# Patient Record
Sex: Male | Born: 1970 | Race: Black or African American | Hispanic: No | Smoking: Never smoker
Health system: Southern US, Community
[De-identification: ages and names within clinical notes are randomized; demographics above are authoritative.]

## PROBLEM LIST (undated history)

## (undated) DIAGNOSIS — I10 Essential (primary) hypertension: Secondary | ICD-10-CM

## (undated) DIAGNOSIS — N183 Chronic kidney disease, stage 3 unspecified: Secondary | ICD-10-CM

## (undated) DIAGNOSIS — I110 Hypertensive heart disease with heart failure: Secondary | ICD-10-CM

## (undated) DIAGNOSIS — I4891 Unspecified atrial fibrillation: Secondary | ICD-10-CM

## (undated) DIAGNOSIS — R0600 Dyspnea, unspecified: Secondary | ICD-10-CM

## (undated) DIAGNOSIS — I82409 Acute embolism and thrombosis of unspecified deep veins of unspecified lower extremity: Secondary | ICD-10-CM

## (undated) HISTORY — PX: NO PAST SURGERIES: SHX2092

---

## 1998-08-04 ENCOUNTER — Emergency Department (HOSPITAL_COMMUNITY): Admission: EM | Admit: 1998-08-04 | Discharge: 1998-08-04 | Payer: Self-pay

## 2002-03-02 ENCOUNTER — Encounter: Payer: Self-pay | Admitting: Emergency Medicine

## 2002-03-02 ENCOUNTER — Emergency Department (HOSPITAL_COMMUNITY): Admission: EM | Admit: 2002-03-02 | Discharge: 2002-03-02 | Payer: Self-pay | Admitting: Emergency Medicine

## 2003-05-25 ENCOUNTER — Emergency Department (HOSPITAL_COMMUNITY): Admission: EM | Admit: 2003-05-25 | Discharge: 2003-05-25 | Payer: Self-pay | Admitting: Emergency Medicine

## 2003-11-02 ENCOUNTER — Emergency Department (HOSPITAL_COMMUNITY): Admission: EM | Admit: 2003-11-02 | Discharge: 2003-11-03 | Payer: Self-pay | Admitting: Emergency Medicine

## 2004-09-14 ENCOUNTER — Emergency Department (HOSPITAL_COMMUNITY): Admission: EM | Admit: 2004-09-14 | Discharge: 2004-09-14 | Payer: Self-pay | Admitting: Emergency Medicine

## 2006-04-27 ENCOUNTER — Emergency Department (HOSPITAL_COMMUNITY): Admission: EM | Admit: 2006-04-27 | Discharge: 2006-04-28 | Payer: Self-pay

## 2006-06-25 ENCOUNTER — Emergency Department (HOSPITAL_COMMUNITY): Admission: EM | Admit: 2006-06-25 | Discharge: 2006-06-25 | Payer: Self-pay | Admitting: Emergency Medicine

## 2008-05-02 ENCOUNTER — Emergency Department (HOSPITAL_COMMUNITY): Admission: EM | Admit: 2008-05-02 | Discharge: 2008-05-02 | Payer: Self-pay | Admitting: Emergency Medicine

## 2008-11-06 ENCOUNTER — Emergency Department (HOSPITAL_COMMUNITY): Admission: EM | Admit: 2008-11-06 | Discharge: 2008-11-07 | Payer: Self-pay | Admitting: Emergency Medicine

## 2009-04-11 ENCOUNTER — Emergency Department (HOSPITAL_COMMUNITY): Admission: EM | Admit: 2009-04-11 | Discharge: 2009-04-11 | Payer: Self-pay | Admitting: Emergency Medicine

## 2009-04-18 ENCOUNTER — Emergency Department (HOSPITAL_COMMUNITY): Admission: EM | Admit: 2009-04-18 | Discharge: 2009-04-18 | Payer: Self-pay | Admitting: Emergency Medicine

## 2009-06-20 ENCOUNTER — Emergency Department (HOSPITAL_COMMUNITY): Admission: EM | Admit: 2009-06-20 | Discharge: 2009-06-20 | Payer: Self-pay | Admitting: Emergency Medicine

## 2011-12-30 ENCOUNTER — Encounter (HOSPITAL_COMMUNITY): Payer: Self-pay | Admitting: Emergency Medicine

## 2011-12-30 ENCOUNTER — Emergency Department (HOSPITAL_COMMUNITY)
Admission: EM | Admit: 2011-12-30 | Discharge: 2011-12-30 | Disposition: A | Discharge status: Home / Self Care | Payer: Self-pay | Attending: Emergency Medicine | Admitting: Emergency Medicine

## 2011-12-30 DIAGNOSIS — I10 Essential (primary) hypertension: Secondary | ICD-10-CM | POA: Insufficient documentation

## 2011-12-30 DIAGNOSIS — Z79899 Other long term (current) drug therapy: Secondary | ICD-10-CM | POA: Insufficient documentation

## 2011-12-30 DIAGNOSIS — R002 Palpitations: Secondary | ICD-10-CM | POA: Insufficient documentation

## 2011-12-30 DIAGNOSIS — R51 Headache: Secondary | ICD-10-CM | POA: Insufficient documentation

## 2011-12-30 LAB — CBC
MCH: 27.3 pg (ref 26.0–34.0)
Platelets: 317 10*3/uL (ref 150–400)
RBC: 5.46 MIL/uL (ref 4.22–5.81)
WBC: 7.9 10*3/uL (ref 4.0–10.5)

## 2011-12-30 LAB — DIFFERENTIAL
Eosinophils Absolute: 0.1 10*3/uL (ref 0.0–0.7)
Lymphs Abs: 2.9 10*3/uL (ref 0.7–4.0)
Neutrophils Relative %: 55 % (ref 43–77)

## 2011-12-30 LAB — BASIC METABOLIC PANEL
GFR calc Af Amer: >90 mL/min (ref >90)
GFR calc non Af Amer: 78 mL/min — ABNORMAL LOW (ref >90)
Glucose, Bld: 90 mg/dL (ref 70–99)
Potassium: 3.5 mEq/L (ref 3.5–5.1)
Sodium: 142 mEq/L (ref 135–145)

## 2011-12-30 MED ORDER — SODIUM CHLORIDE 0.9 % IV BOLUS (SEPSIS): 1000.0000 mL | Freq: Once | INTRAVENOUS | Status: DC

## 2011-12-30 MED ORDER — HYDROCODONE-ACETAMINOPHEN 5-500 MG PO TABS: 1.0000 | ORAL_TABLET | Freq: Four times a day (QID) | ORAL | Status: AC | PRN

## 2011-12-30 MED ORDER — HYDROCHLOROTHIAZIDE 25 MG PO TABS
25.0000 mg | ORAL_TABLET | Freq: Once | ORAL | Status: AC
  Administered 2011-12-30: 25 mg via ORAL
  Filled 2011-12-30: qty 1

## 2011-12-30 MED ORDER — CLONIDINE HCL 0.1 MG PO TABS
0.1000 mg | ORAL_TABLET | Freq: Once | ORAL | Status: AC
  Administered 2011-12-30: 0.1 mg via ORAL
  Filled 2011-12-30: qty 1

## 2011-12-30 MED ORDER — HYDROCHLOROTHIAZIDE 25 MG PO TABS: 25.0000 mg | ORAL_TABLET | Freq: Every day | ORAL | Status: DC

## 2011-12-30 MED ORDER — OXYCODONE-ACETAMINOPHEN 5-325 MG PO TABS
1.0000 | ORAL_TABLET | Freq: Once | ORAL | Status: AC
  Administered 2011-12-30: 1 via ORAL
  Filled 2011-12-30: qty 1

## 2011-12-30 NOTE — ED Notes (Signed)
PT. REPORTS PALPITATIONS , HEADACHE , DIZZINESS , " SEEING DIFFERENT COLORS WHILE DRIVING " TODAY .

## 2011-12-30 NOTE — Discharge Instructions (Signed)
Arterial Hypertension Arterial hypertension (high blood pressure) is a condition of elevated pressure in your blood vessels. Hypertension over a long period of time is a risk factor for strokes, heart attacks, and heart failure. It is also the leading cause of kidney (renal) failure.  CAUSES   In Adults -- Over 90% of all hypertension has no known cause. This is called essential or primary hypertension. In the other 10% of people with hypertension, the increase in blood pressure is caused by another disorder. This is called secondary hypertension. Important causes of secondary hypertension are:   Heavy alcohol use.   Obstructive sleep apnea.   Hyperaldosterosim (Conn's syndrome).   Steroid use.   Chronic kidney failure.   Hyperparathyroidism.   Medications.   Renal artery stenosis.   Pheochromocytoma.   Cushing's disease.   Coarctation of the aorta.   Scleroderma renal crisis.   Licorice (in excessive amounts).   Drugs (cocaine, methamphetamine).  Your caregiver can explain any items above that apply to you.  In Children -- Secondary hypertension is more common and should always be considered.   Pregnancy -- Few women of childbearing age have high blood pressure. However, up to 10% of them develop hypertension of pregnancy. Generally, this will not harm the woman. It may be a sign of 3 complications of pregnancy: preeclampsia, HELLP syndrome, and eclampsia. Follow up and control with medication is necessary.  SYMPTOMS   This condition normally does not produce any noticeable symptoms. It is usually found during a routine exam.   Malignant hypertension is a late problem of high blood pressure. It may have the following symptoms:   Headaches.   Blurred vision.   End-organ damage (this means your kidneys, heart, lungs, and other organs are being damaged).   Stressful situations can increase the blood pressure. If a person with normal blood pressure has their blood  pressure go up while being seen by their caregiver, this is often termed "white coat hypertension." Its importance is not known. It may be related with eventually developing hypertension or complications of hypertension.   Hypertension is often confused with mental tension, stress, and anxiety.  DIAGNOSIS  The diagnosis is made by 3 separate blood pressure measurements. They are taken at least 1 week apart from each other. If there is organ damage from hypertension, the diagnosis may be made without repeat measurements. Hypertension is usually identified by having blood pressure readings:  Above 140/90 mmHg measured in both arms, at 3 separate times, over a couple weeks.   Over 130/80 mmHg should be considered a risk factor and may require treatment in patients with diabetes.  Blood pressure readings over 120/80 mmHg are called "pre-hypertension" even in non-diabetic patients. To get a true blood pressure measurement, use the following guidelines. Be aware of the factors that can alter blood pressure readings.  Take measurements at least 1 hour after caffeine.   Take measurements 30 minutes after smoking and without any stress. This is another reason to quit smoking - it raises your blood pressure.   Use a proper cuff size. Ask your caregiver if you are not sure about your cuff size.   Most home blood pressure cuffs are automatic. They will measure systolic and diastolic pressures. The systolic pressure is the pressure reading at the start of sounds. Diastolic pressure is the pressure at which the sounds disappear. If you are elderly, measure pressures in multiple postures. Try sitting, lying or standing.   Sit at rest for a minimum of   5 minutes before taking measurements.   You should not be on any medications like decongestants. These are found in many cold medications.   Record your blood pressure readings and review them with your caregiver.  If you have hypertension:  Your caregiver  may do tests to be sure you do not have secondary hypertension (see "causes" above).   Your caregiver may also look for signs of metabolic syndrome. This is also called Syndrome X or Insulin Resistance Syndrome. You may have this syndrome if you have type 2 diabetes, abdominal obesity, and abnormal blood lipids in addition to hypertension.   Your caregiver will take your medical and family history and perform a physical exam.   Diagnostic tests may include blood tests (for glucose, cholesterol, potassium, and kidney function), a urinalysis, or an EKG. Other tests may also be necessary depending on your condition.  PREVENTION  There are important lifestyle issues that you can adopt to reduce your chance of developing hypertension:  Maintain a normal weight.   Limit the amount of salt (sodium) in your diet.   Exercise often.   Limit alcohol intake.   Get enough potassium in your diet. Discuss specific advice with your caregiver.   Follow a DASH diet (dietary approaches to stop hypertension). This diet is rich in fruits, vegetables, and low-fat dairy products, and avoids certain fats.  PROGNOSIS  Essential hypertension cannot be cured. Lifestyle changes and medical treatment can lower blood pressure and reduce complications. The prognosis of secondary hypertension depends on the underlying cause. Many people whose hypertension is controlled with medicine or lifestyle changes can live a normal, healthy life.  RISKS AND COMPLICATIONS  While high blood pressure alone is not an illness, it often requires treatment due to its short- and long-term effects on many organs. Hypertension increases your risk for:  CVAs or strokes (cerebrovascular accident).   Heart failure due to chronically high blood pressure (hypertensive cardiomyopathy).   Heart attack (myocardial infarction).   Damage to the retina (hypertensive retinopathy).   Kidney failure (hypertensive nephropathy).  Your caregiver can  explain list items above that apply to you. Treatment of hypertension can significantly reduce the risk of complications. TREATMENT   For overweight patients, weight loss and regular exercise are recommended. Physical fitness lowers blood pressure.   Mild hypertension is usually treated with diet and exercise. A diet rich in fruits and vegetables, fat-free dairy products, and foods low in fat and salt (sodium) can help lower blood pressure. Decreasing salt intake decreases blood pressure in a 1/3 of people.   Stop smoking if you are a smoker.  The steps above are highly effective in reducing blood pressure. While these actions are easy to suggest, they are difficult to achieve. Most patients with moderate or severe hypertension end up requiring medications to bring their blood pressure down to a normal level. There are several classes of medications for treatment. Blood pressure pills (antihypertensives) will lower blood pressure by their different actions. Lowering the blood pressure by 10 mmHg may decrease the risk of complications by as much as 25%. The goal of treatment is effective blood pressure control. This will reduce your risk for complications. Your caregiver will help you determine the best treatment for you according to your lifestyle. What is excellent treatment for one person, may not be for you. HOME CARE INSTRUCTIONS   Do not smoke.   Follow the lifestyle changes outlined in the "Prevention" section.   If you are on medications, follow the directions   carefully. Blood pressure medications must be taken as prescribed. Skipping doses reduces their benefit. It also puts you at risk for problems.   Follow up with your caregiver, as directed.   If you are asked to monitor your blood pressure at home, follow the guidelines in the "Diagnosis" section above.  SEEK MEDICAL CARE IF:   You think you are having medication side effects.   You have recurrent headaches or lightheadedness.     You have swelling in your ankles.   You have trouble with your vision.  SEEK IMMEDIATE MEDICAL CARE IF:   You have sudden onset of chest pain or pressure, difficulty breathing, or other symptoms of a heart attack.   You have a severe headache.   You have symptoms of a stroke (such as sudden weakness, difficulty speaking, difficulty walking).  MAKE SURE YOU:   Understand these instructions.   Will watch your condition.   Will get help right away if you are not doing well or get worse.  Document Released: 08/04/2005 Document Revised: 07/24/2011 Document Reviewed: 03/04/2007 [employer] Patient Information 2012 [employer]. 

## 2011-12-30 NOTE — ED Provider Notes (Signed)
History     CSN: [id number]  Arrival date & time 12/30/11  0020   First MD Initiated Contact with Patient 12/30/11 0145      Chief Complaint  Patient presents with  . Palpitations     The history is provided by the patient.   the patient reports headache and dizziness today.  Does report palpitations it was transient.  He has no chest pain shortness of breath.  He reports his headache has been present for about a week now.  He denies weakness of his upper and lower extremities.  Denies abdominal pain.  He said no nausea vomiting or diarrhea.  He denies blurred vision.  Nursing note reports he stated that he was seeing different colors when driving today.  He has no prior history of headaches or migraine headaches.  His over-the-counter pain medication without improvement.  His pain is mild to moderate at this time.  He has no history of hypertension.  He does not have a primary care physician and does not get regular checkups.  History reviewed. No pertinent past medical history.  History reviewed. No pertinent past surgical history.  No family history on file.  History  Substance Use Topics  . Smoking status: Never Smoker   . Smokeless tobacco: Not on file  . Alcohol Use: No      Review of Systems  Cardiovascular: Positive for palpitations.  All other systems reviewed and are negative.    Allergies  Review of patient's allergies indicates no known allergies.  Home Medications   Current Outpatient Rx  Name Route Sig Dispense Refill  . HYDROCHLOROTHIAZIDE 25 MG PO TABS Oral Take 1 tablet (25 mg total) by mouth daily. 30 tablet 1  . HYDROCODONE-ACETAMINOPHEN 5-500 MG PO TABS Oral Take 1 tablet by mouth every 6 (six) hours as needed for pain. 8 tablet 0    BP 191/111  Pulse 54  Temp(Src) 97.9 F (36.6 C) (Oral)  Resp 14  SpO2 100%  Physical Exam  Nursing note and vitals reviewed. Constitutional: He is oriented to person, place, and time. He appears  well-developed and well-nourished.  HENT:  Head: Normocephalic and atraumatic.  Eyes: EOM are normal. Pupils are equal, round, and reactive to light.  Neck: Normal range of motion.  Cardiovascular: Normal rate, regular rhythm, normal heart sounds and intact distal pulses.   Pulmonary/Chest: Effort normal and breath sounds normal. No respiratory distress.  Abdominal: Soft. He exhibits no distension. There is no tenderness.  Musculoskeletal: Normal range of motion.  Neurological: He is alert and oriented to person, place, and time.       5/5 strength in major muscle groups of  bilateral upper and lower extremities. Speech normal. No facial asymetry.   Skin: Skin is warm and dry.  Psychiatric: He has a normal mood and affect. Judgment normal.    ED Course  Procedures (including critical care time)  Labs Reviewed  BASIC METABOLIC PANEL - Abnormal; Notable for the following:    GFR calc non Af Amer 78 (*)    All other components within normal limits  CBC  DIFFERENTIAL  URINALYSIS, WITH MICROSCOPIC   No results found.   1. Headache   2. Hypertension       MDM  The patient's headache was treated.  His neuro exam is nonfocal.  He does not need a CT scan of his head at this time.  His glucose is normal his renal function is normal.  He does have hypertension.  This was treated in the emergency department he'll be discharged home with a prescription for hydrochlorothiazide.  I've instructed him in followup with her primary care physician for recheck of his blood pressure.  He understands to return the emergency department for new or worsening symptoms        Lyanne Co, MD 12/30/11 0400

## 2014-03-09 ENCOUNTER — Encounter (HOSPITAL_COMMUNITY): Payer: Self-pay | Admitting: Emergency Medicine

## 2014-03-09 ENCOUNTER — Emergency Department (HOSPITAL_COMMUNITY): Payer: Self-pay

## 2014-03-09 ENCOUNTER — Emergency Department (HOSPITAL_COMMUNITY)
Admission: EM | Admit: 2014-03-09 | Discharge: 2014-03-09 | Disposition: A | Discharge status: Home / Self Care | Payer: Self-pay | Attending: Emergency Medicine | Admitting: Emergency Medicine

## 2014-03-09 DIAGNOSIS — I160 Hypertensive urgency: Secondary | ICD-10-CM

## 2014-03-09 DIAGNOSIS — R509 Fever, unspecified: Secondary | ICD-10-CM | POA: Insufficient documentation

## 2014-03-09 DIAGNOSIS — R51 Headache: Secondary | ICD-10-CM | POA: Insufficient documentation

## 2014-03-09 DIAGNOSIS — Z79899 Other long term (current) drug therapy: Secondary | ICD-10-CM | POA: Insufficient documentation

## 2014-03-09 DIAGNOSIS — I10 Essential (primary) hypertension: Secondary | ICD-10-CM | POA: Insufficient documentation

## 2014-03-09 HISTORY — DX: Essential (primary) hypertension: I10

## 2014-03-09 LAB — COMPREHENSIVE METABOLIC PANEL
ALBUMIN: 4.2 g/dL (ref 3.5–5.2)
ALT: 32 U/L (ref 0–53)
AST: 26 U/L (ref 0–37)
Alkaline Phosphatase: 89 U/L (ref 39–117)
Anion gap: 14 (ref 5–15)
BUN: 13 mg/dL (ref 6–23)
CALCIUM: 9.3 mg/dL (ref 8.4–10.5)
CO2: 26 mEq/L (ref 19–32)
CREATININE: 1.37 mg/dL — AB (ref 0.50–1.35)
Chloride: 101 mEq/L (ref 96–112)
GFR calc Af Amer: 72 mL/min — ABNORMAL LOW (ref >90)
GFR, EST NON AFRICAN AMERICAN: 62 mL/min — AB (ref >90)
Glucose, Bld: 92 mg/dL (ref 70–99)
Potassium: 3.3 mEq/L — ABNORMAL LOW (ref 3.7–5.3)
SODIUM: 141 meq/L (ref 137–147)
TOTAL PROTEIN: 7.5 g/dL (ref 6.0–8.3)
Total Bilirubin: 0.5 mg/dL (ref 0.3–1.2)

## 2014-03-09 LAB — CBC WITH DIFFERENTIAL/PLATELET
BASOS ABS: 0 10*3/uL (ref 0.0–0.1)
BASOS PCT: 0 % (ref 0–1)
EOS ABS: 0 10*3/uL (ref 0.0–0.7)
EOS PCT: 0 % (ref 0–5)
HEMATOCRIT: 42 % (ref 39.0–52.0)
Hemoglobin: 14.2 g/dL (ref 13.0–17.0)
Lymphocytes Relative: 11 % — ABNORMAL LOW (ref 12–46)
Lymphs Abs: 1.3 10*3/uL (ref 0.7–4.0)
MCH: 26.8 pg (ref 26.0–34.0)
MCHC: 33.8 g/dL (ref 30.0–36.0)
MCV: 79.4 fL (ref 78.0–100.0)
MONO ABS: 1 10*3/uL (ref 0.1–1.0)
Monocytes Relative: 8 % (ref 3–12)
Neutro Abs: 9.6 10*3/uL — ABNORMAL HIGH (ref 1.7–7.7)
Neutrophils Relative %: 81 % — ABNORMAL HIGH (ref 43–77)
PLATELETS: 285 10*3/uL (ref 150–400)
RBC: 5.29 MIL/uL (ref 4.22–5.81)
RDW: 13.6 % (ref 11.5–15.5)
WBC: 11.9 10*3/uL — ABNORMAL HIGH (ref 4.0–10.5)

## 2014-03-09 LAB — TROPONIN I

## 2014-03-09 MED ORDER — LISINOPRIL 10 MG PO TABS: 10.0000 mg | ORAL_TABLET | Freq: Every day | ORAL | Status: DC

## 2014-03-09 MED ORDER — ACETAMINOPHEN 500 MG PO TABS
1000.0000 mg | ORAL_TABLET | Freq: Once | ORAL | Status: AC
  Administered 2014-03-09: 1000 mg via ORAL
  Filled 2014-03-09: qty 2

## 2014-03-09 MED ORDER — LABETALOL HCL 200 MG PO TABS
200.0000 mg | ORAL_TABLET | Freq: Once | ORAL | Status: AC
  Administered 2014-03-09: 200 mg via ORAL
  Filled 2014-03-09: qty 1

## 2014-03-09 MED ORDER — ALBUTEROL SULFATE HFA 108 (90 BASE) MCG/ACT IN AERS
2.0000 | INHALATION_SPRAY | RESPIRATORY_TRACT | Status: DC | PRN
  Filled 2014-03-09: qty 6.7

## 2014-03-09 MED ORDER — METOPROLOL TARTRATE 50 MG PO TABS: 50.0000 mg | ORAL_TABLET | Freq: Two times a day (BID) | ORAL | Status: DC

## 2014-03-09 NOTE — Discharge Planning (Signed)
[facility] Community Liaison ° °Spoke to patient regarding primary care resources and establishing care with a provider. Resource guide and my contact information provided for any future questions or concerns. No other community liaison needs identified at this time. °

## 2014-03-09 NOTE — Discharge Instructions (Signed)
Metoprolol and lisinopril as prescribed for your blood pressure.  Take Tylenol 1000 mg every 6 hours as needed for fever.  Measure your blood pressure several times daily and keep a record of these so that you can take this to your followup appointment with your new primary Dr.   Hypertension Hypertension, commonly called high blood pressure, is when the force of blood pumping through your arteries is too strong. Your arteries are the blood vessels that carry blood from your heart throughout your body. A blood pressure reading consists of a higher number over a lower number, such as 110/72. The higher number (systolic) is the pressure inside your arteries when your heart pumps. The lower number (diastolic) is the pressure inside your arteries when your heart relaxes. Ideally you want your blood pressure below 120/80. Hypertension forces your heart to work harder to pump blood. Your arteries may become narrow or stiff. Having hypertension puts you at risk for heart disease, stroke, and other problems.  RISK FACTORS Some risk factors for high blood pressure are controllable. Others are not.  Risk factors you cannot control include:   Race. You may be at higher risk if you are African American.  Age. Risk increases with age.  Gender. Men are at higher risk than women before age 21 years. After age 90, women are at higher risk than men. Risk factors you can control include:  Not getting enough exercise or physical activity.  Being overweight.  Getting too much fat, sugar, calories, or salt in your diet.  Drinking too much alcohol. SIGNS AND SYMPTOMS Hypertension does not usually cause signs or symptoms. Extremely high blood pressure (hypertensive crisis) may cause headache, anxiety, shortness of breath, and nosebleed. DIAGNOSIS  To check if you have hypertension, your health care provider will measure your blood pressure while you are seated, with your arm held at the level of your heart.  It should be measured at least twice using the same arm. Certain conditions can cause a difference in blood pressure between your right and left arms. A blood pressure reading that is higher than normal on one occasion does not mean that you need treatment. If one blood pressure reading is high, ask your health care provider about having it checked again. TREATMENT  Treating high blood pressure includes making lifestyle changes and possibly taking medicine. Living a healthy lifestyle can help lower high blood pressure. You may need to change some of your habits. Lifestyle changes may include:  Following the DASH diet. This diet is high in fruits, vegetables, and whole grains. It is low in salt, red meat, and added sugars.  Getting at least 2 hours of brisk physical activity every week.  Losing weight if necessary.  Not smoking.  Limiting alcoholic beverages.  Learning ways to reduce stress. If lifestyle changes are not enough to get your blood pressure under control, your health care provider may prescribe medicine. You may need to take more than one. Work closely with your health care provider to understand the risks and benefits. HOME CARE INSTRUCTIONS  Have your blood pressure rechecked as directed by your health care provider.   Take medicines only as directed by your health care provider. Follow the directions carefully. Blood pressure medicines must be taken as prescribed. The medicine does not work as well when you skip doses. Skipping doses also puts you at risk for problems.   Do not smoke.   Monitor your blood pressure at home as directed by your health care  provider. SEEK MEDICAL CARE IF:   You think you are having a reaction to medicines taken.  You have recurrent headaches or feel dizzy.  You have swelling in your ankles.  You have trouble with your vision. SEEK IMMEDIATE MEDICAL CARE IF:  You develop a severe headache or confusion.  You have unusual  weakness, numbness, or feel faint.  You have severe chest or abdominal pain.  You vomit repeatedly.  You have trouble breathing. MAKE SURE YOU:   Understand these instructions.  Will watch your condition.  Will get help right away if you are not doing well or get worse. Document Released: 08/04/2005 Document Revised: 12/19/2013 Document Reviewed: 05/27/2013 [facility] Patient Information 2015 [place], [place]. This information is not intended to replace advice given to you by your health care provider. Make sure you discuss any questions you have with your health care provider.

## 2014-03-09 NOTE — ED Notes (Signed)
Pharmacy notified of missing medication, advised they will send.

## 2014-03-09 NOTE — ED Notes (Signed)
H/a since yesterday and diarrhea also  Has had the chills

## 2014-03-09 NOTE — ED Provider Notes (Signed)
CSN: [id number]     Arrival date & time 03/09/14  1140 History   First MD Initiated Contact with Patient 03/09/14 1156     Chief Complaint  Patient presents with  . Headache     (Consider location/radiation/quality/duration/timing/severity/associated sxs/prior Treatment) HPI Comments: Patient is a 43 year old male with past medical history of untreated hypertension. He presents today for evaluation of headache, chills, loose stools, and generalized malaise since yesterday. He denies any abdominal pain, stiff neck, vomiting. He denies any bloody stool. He denies any urinary complaints.  Patient is a 43 y.o. male presenting with hypertension. The history is provided by the patient.  Hypertension This is a new problem. The problem occurs constantly. The problem has not changed since onset.Associated symptoms include headaches. Nothing aggravates the symptoms. Nothing relieves the symptoms. He has tried nothing for the symptoms. The treatment provided no relief.    Past Medical History  Diagnosis Date  . Hypertension    History reviewed. No pertinent past surgical history. No family history on file. History  Substance Use Topics  . Smoking status: Never Smoker   . Smokeless tobacco: Not on file  . Alcohol Use: No    Review of Systems  Neurological: Positive for headaches.  All other systems reviewed and are negative.     Allergies  Review of patient's allergies indicates no known allergies.  Home Medications   Prior to Admission medications   Medication Sig Start Date End Date Taking? Authorizing Provider  hydrochlorothiazide (HYDRODIURIL) 25 MG tablet Take 1 tablet (25 mg total) by mouth daily. 12/30/11 12/29/12  Lyanne CoKevin M Campos, MD   BP 237/137  Pulse 100  Temp(Src) 101 F (38.3 C)  Resp 23  SpO2 99% Physical Exam  Nursing note and vitals reviewed. Constitutional: He is oriented to person, place, and time. He appears well-developed and well-nourished. No distress.   Awake, alert, nontoxic appearance.  HENT:  Head: Normocephalic and atraumatic.  Mouth/Throat: Oropharynx is clear and moist.  Eyes: EOM are normal. Pupils are equal, round, and reactive to light. Right eye exhibits no discharge. Left eye exhibits no discharge.  Neck: Normal range of motion. Neck supple.  Cardiovascular: Normal rate, regular rhythm and normal heart sounds.   No murmur heard. Pulmonary/Chest: Effort normal and breath sounds normal. No respiratory distress. He has no wheezes. He has no rales.  Abdominal: Soft. Bowel sounds are normal. He exhibits no distension. There is no tenderness. There is no rebound.  Musculoskeletal: Normal range of motion. He exhibits no edema and no tenderness.  Baseline ROM, no obvious new focal weakness.  Lymphadenopathy:    He has no cervical adenopathy.  Neurological: He is alert and oriented to person, place, and time. No cranial nerve deficit. He exhibits normal muscle tone. Coordination normal.  Mental status and motor strength appears baseline for patient and situation.  Skin: Skin is warm and dry. No rash noted. He is not diaphoretic.  Psychiatric: He has a normal mood and affect.    ED Course  Procedures (including critical care time) Labs Review Labs Reviewed  CBC WITH DIFFERENTIAL  COMPREHENSIVE METABOLIC PANEL  TROPONIN I    Imaging Review No results found.   EKG Interpretation   Date/Time:  Thursday March 09 2014 12:00:06 EDT Ventricular Rate:  92 PR Interval:  170 QRS Duration: 106 QT Interval:  338 QTC Calculation: 418 R Axis:   35 Text Interpretation:  Sinus rhythm LAE, consider biatrial enlargement LVH  with secondary repolarization abnormality Anterior infarct, acute (  LAD)  Confirmed by Malva Cogan  MD, Liona Wengert ([id number]) on 03/09/2014 12:14:48 PM      MDM   Final diagnoses:  None    The patient is a 43 year old male who presents with fever, chills, headache, and diarrhea. His symptoms sound viral in nature. There  is no nuchal rigidity and I do not feel as though an LP is indicated.  While the patient was being triaged he was found to be markedly hypertensive. His blood pressure was 235/135. EKG was obtained which reveals evidence for LVH with repolarization abnormality, however laboratory studies are essentially unremarkable. Chest x-ray reveals cardiomegaly. The patient was given Normodyne with an acceptable decrease in his blood pressure. Patient has no primary care doctor and was given the resource guide to make arrangements for followup. He will be given antihypertensive medications and advised to keep a record of his blood pressures which she can take with him to his followup appointment. Patient was advised of the possible negative effects of untreated hypertension can potentially have on his health and he seems to understand this.    Geoffery Lyons, MD 03/09/14 [phone]

## 2014-04-08 ENCOUNTER — Emergency Department (HOSPITAL_COMMUNITY): Payer: Self-pay

## 2014-04-08 ENCOUNTER — Encounter (HOSPITAL_COMMUNITY): Payer: Self-pay | Admitting: Emergency Medicine

## 2014-04-08 ENCOUNTER — Emergency Department (HOSPITAL_COMMUNITY)
Admission: EM | Admit: 2014-04-08 | Discharge: 2014-04-08 | Disposition: A | Discharge status: Home / Self Care | Payer: Self-pay | Attending: Emergency Medicine | Admitting: Emergency Medicine

## 2014-04-08 DIAGNOSIS — I10 Essential (primary) hypertension: Secondary | ICD-10-CM | POA: Insufficient documentation

## 2014-04-08 DIAGNOSIS — M25521 Pain in right elbow: Secondary | ICD-10-CM

## 2014-04-08 DIAGNOSIS — M79609 Pain in unspecified limb: Secondary | ICD-10-CM | POA: Insufficient documentation

## 2014-04-08 DIAGNOSIS — M25529 Pain in unspecified elbow: Secondary | ICD-10-CM | POA: Insufficient documentation

## 2014-04-08 DIAGNOSIS — Z79899 Other long term (current) drug therapy: Secondary | ICD-10-CM | POA: Insufficient documentation

## 2014-04-08 MED ORDER — HYDROCODONE-ACETAMINOPHEN 5-325 MG PO TABS: 1.0000 | ORAL_TABLET | ORAL | Status: DC | PRN

## 2014-04-08 MED ORDER — NAPROXEN 500 MG PO TABS
500.0000 mg | ORAL_TABLET | Freq: Two times a day (BID) | ORAL | Status: DC
Start: 2014-04-08 — End: 2016-02-20

## 2014-04-08 MED ORDER — KETOROLAC TROMETHAMINE 60 MG/2ML IM SOLN
60.0000 mg | Freq: Once | INTRAMUSCULAR | Status: AC
  Administered 2014-04-08: 60 mg via INTRAMUSCULAR
  Filled 2014-04-08: qty 2

## 2014-04-08 NOTE — Discharge Instructions (Signed)
Musculoskeletal Pain Musculoskeletal pain is muscle and boney aches and pains. These pains can occur in any part of the body. Your caregiver may treat you without knowing the cause of the pain. They may treat you if blood or urine tests, X-rays, and other tests were normal.  CAUSES There is often not a definite cause or reason for these pains. These pains may be caused by a type of germ (virus). The discomfort may also come from overuse. Overuse includes working out too hard when your body is not fit. Boney aches also come from weather changes. Bone is sensitive to atmospheric pressure changes. HOME CARE INSTRUCTIONS   Ask when your test results will be ready. Make sure you get your test results.  Only take over-the-counter or prescription medicines for pain, discomfort, or fever as directed by your caregiver. If you were given medications for your condition, do not drive, operate machinery or power tools, or sign legal documents for 24 hours. Do not drink alcohol. Do not take sleeping pills or other medications that may interfere with treatment.  Continue all activities unless the activities cause more pain. When the pain lessens, slowly resume normal activities. Gradually increase the intensity and duration of the activities or exercise.  During periods of severe pain, bed rest may be helpful. Lay or sit in any position that is comfortable.  Putting ice on the injured area.  Put ice in a bag.  Place a towel between your skin and the bag.  Leave the ice on for 15 to 20 minutes, 3 to 4 times a day.  Follow up with your caregiver for continued problems and no reason can be found for the pain. If the pain becomes worse or does not go away, it may be necessary to repeat tests or do additional testing. Your caregiver may need to look further for a possible cause. SEEK IMMEDIATE MEDICAL CARE IF:  You have pain that is getting worse and is not relieved by medications.  You develop chest pain  that is associated with shortness or breath, sweating, feeling sick to your stomach (nauseous), or throw up (vomit).  Your pain becomes localized to the abdomen.  You develop any new symptoms that seem different or that concern you. MAKE SURE YOU:   Understand these instructions.  Will watch your condition.  Will get help right away if you are not doing well or get worse. Document Released: 08/04/2005 Document Revised: 10/27/2011 Document Reviewed: 04/08/2013 [facility] Patient Information 2015 [place], [place]. This information is not intended to replace advice given to you by your health care provider. Make sure you discuss any questions you have with your health care provider.  Arthralgia Your caregiver has diagnosed you as suffering from an arthralgia. Arthralgia means there is pain in a joint. This can come from many reasons including:  Bruising the joint which causes soreness (inflammation) in the joint.  Wear and tear on the joints which occur as we grow older (osteoarthritis).  Overusing the joint.  Various forms of arthritis.  Infections of the joint. Regardless of the cause of pain in your joint, most of these different pains respond to anti-inflammatory drugs and rest. The exception to this is when a joint is infected, and these cases are treated with antibiotics, if it is a bacterial infection. HOME CARE INSTRUCTIONS   Rest the injured area for as long as directed by your caregiver. Then slowly start using the joint as directed by your caregiver and as the pain allows. Crutches  as directed may be useful if the ankles, knees or hips are involved. If the knee was splinted or casted, continue use and care as directed. If an stretchy or elastic wrapping bandage has been applied today, it should be removed and re-applied every 3 to 4 hours. It should not be applied tightly, but firmly enough to keep swelling down. Watch toes and feet for swelling, bluish discoloration, coldness,  numbness or excessive pain. If any of these problems (symptoms) occur, remove the ace bandage and re-apply more loosely. If these symptoms persist, contact your caregiver or return to this location.  For the first 24 hours, keep the injured extremity elevated on pillows while lying down.  Apply ice for 15-20 minutes to the sore joint every couple hours while awake for the first half day. Then 03-04 times per day for the first 48 hours. Put the ice in a plastic bag and place a towel between the bag of ice and your skin.  Wear any splinting, casting, elastic bandage applications, or slings as instructed.  Only take over-the-counter or prescription medicines for pain, discomfort, or fever as directed by your caregiver. Do not use aspirin immediately after the injury unless instructed by your physician. Aspirin can cause increased bleeding and bruising of the tissues.  If you were given crutches, continue to use them as instructed and do not resume weight bearing on the sore joint until instructed. Persistent pain and inability to use the sore joint as directed for more than 2 to 3 days are warning signs indicating that you should see a caregiver for a follow-up visit as soon as possible. Initially, a hairline fracture (break in bone) may not be evident on X-rays. Persistent pain and swelling indicate that further evaluation, non-weight bearing or use of the joint (use of crutches or slings as instructed), or further X-rays are indicated. X-rays may sometimes not show a small fracture until a week or 10 days later. Make a follow-up appointment with your own caregiver or one to whom we have referred you. A radiologist (specialist in reading X-rays) may read your X-rays. Make sure you know how you are to obtain your X-ray results. Do not assume everything is normal if you do not hear from [place]. SEEK MEDICAL CARE IF: Bruising, swelling, or pain increases. SEEK IMMEDIATE MEDICAL CARE IF:   Your fingers or toes  are numb or blue.  The pain is not responding to medications and continues to stay the same or get worse.  The pain in your joint becomes severe.  You develop a fever over 102 F (38.9 C).  It becomes impossible to move or use the joint. MAKE SURE YOU:   Understand these instructions.  Will watch your condition.  Will get help right away if you are not doing well or get worse. Document Released: 08/04/2005 Document Revised: 10/27/2011 Document Reviewed: 03/22/2008 [facility] Patient Information 2015 [place], [place]. This information is not intended to replace advice given to you by your health care provider. Make sure you discuss any questions you have with your health care provider.

## 2014-04-08 NOTE — ED Provider Notes (Signed)
CSN: [id number]     Arrival date & time 04/08/14  [id number] History   First MD Initiated Contact with Patient 04/08/14 [phone]     Chief Complaint  Patient presents with  . Arm Pain     (Consider location/radiation/quality/duration/timing/severity/associated sxs/prior Treatment) Patient is a 43 y.o. male presenting with extremity pain.  Extremity Pain This is a new problem. The current episode started 2 days ago. The problem occurs constantly. The problem has been gradually worsening. Pertinent negatives include no chest pain and no shortness of breath. Associated symptoms comments: No numbness, no tingling, no weakness. Exacerbated by: Full extension and full flexion of the elbow. Nothing relieves the symptoms. He has tried nothing for the symptoms.    Past Medical History  Diagnosis Date  . Hypertension    History reviewed. No pertinent past surgical history. History reviewed. No pertinent family history. History  Substance Use Topics  . Smoking status: Never Smoker   . Smokeless tobacco: Not on file  . Alcohol Use: No    Review of Systems  Respiratory: Negative for shortness of breath.   Cardiovascular: Negative for chest pain.  All other systems reviewed and are negative.     Allergies  Review of patient's allergies indicates no known allergies.  Home Medications   Prior to Admission medications   Medication Sig Start Date End Date Taking? Authorizing Provider  lisinopril (PRINIVIL,ZESTRIL) 10 MG tablet Take 1 tablet (10 mg total) by mouth daily. 03/09/14  Yes Geoffery Lyonsouglas Delo, MD  metoprolol (LOPRESSOR) 50 MG tablet Take 1 tablet (50 mg total) by mouth 2 (two) times daily. 03/09/14  Yes Geoffery Lyonsouglas Delo, MD   BP 207/131  Pulse 70  Temp(Src) 98.7 F (37.1 C) (Oral)  Resp 18  Ht 6\' 1"  (1.854 m)  Wt 260 lb (117.935 kg)  BMI 34.31 kg/m2  SpO2 99% Physical Exam  Nursing note and vitals reviewed. Constitutional: He is oriented to person, place, and time. He appears  well-developed and well-nourished. No distress.  HENT:  Head: Normocephalic and atraumatic.  Eyes: Conjunctivae are normal. No scleral icterus.  Neck: Neck supple.  Cardiovascular: Normal rate and intact distal pulses.   Pulmonary/Chest: Effort normal. No stridor. No respiratory distress.  Abdominal: Normal appearance. He exhibits no distension.  Musculoskeletal:       Right elbow: He exhibits decreased range of motion (no warmth or redness ) and swelling. He exhibits no effusion, no deformity and no laceration. Tenderness found.  Neurological: He is alert and oriented to person, place, and time.  Skin: Skin is warm and dry. No rash noted.  Psychiatric: He has a normal mood and affect. His behavior is normal.    ED Course  Procedures (including critical care time) Labs Review Labs Reviewed - No data to display  Imaging Review Dg Elbow Complete Right  04/08/2014   CLINICAL DATA:  Right elbow pain and swelling without injury.  EXAM: RIGHT ELBOW - COMPLETE 3+ VIEW  COMPARISON:  None.  FINDINGS: There is no evidence of fracture, dislocation, or joint effusion. Mild osteophyte formation is seen arising from the olecranon and coronoid process of the proximal ulna. Soft tissues are unremarkable.  IMPRESSION: No acute abnormality seen in the right elbow.   Electronically Signed   By: Roque LiasJames  Green M.D.   On: 04/08/2014 12:16  All radiology studies independently viewed by me.      EKG Interpretation None      MDM   Final diagnoses:  Right elbow pain  43 year old male with right elbow pain.  He reports a prior injury although from many years ago.  Pain mostly with extension and flexion of elbow.  Appears to have small joint effusion.  However, pt well appearing, no fevers, no erythema, no warmth.  Low suspicion for elbow joint septic arthritis.  Ketoralac for pain.  Sling for comfort.  Plain film pending.   Plain film negative, pain better. After pain medicine his range of motion was  normal. Discharged home with return precautions and NSAIDs  Candyce Churn III, MD 04/09/14 1700

## 2014-04-08 NOTE — ED Notes (Signed)
Pt complains of right arm pain with movement. Denies injury

## 2014-04-08 NOTE — ED Notes (Signed)
Pt states R elbow pain. Ongoing x2 days. Pt states pain increases with movement. 9/10 pain at the time. Pt denies recent injury.

## 2015-01-13 ENCOUNTER — Emergency Department (HOSPITAL_COMMUNITY)
Admission: EM | Admit: 2015-01-13 | Discharge: 2015-01-13 | Discharge status: Against Medical Advice | Payer: Self-pay | Attending: Emergency Medicine | Admitting: Emergency Medicine

## 2015-01-13 ENCOUNTER — Emergency Department (HOSPITAL_COMMUNITY): Payer: Self-pay

## 2015-01-13 ENCOUNTER — Encounter (HOSPITAL_COMMUNITY): Payer: Self-pay | Admitting: Emergency Medicine

## 2015-01-13 DIAGNOSIS — R05 Cough: Secondary | ICD-10-CM | POA: Insufficient documentation

## 2015-01-13 DIAGNOSIS — R778 Other specified abnormalities of plasma proteins: Secondary | ICD-10-CM

## 2015-01-13 DIAGNOSIS — I10 Essential (primary) hypertension: Secondary | ICD-10-CM | POA: Insufficient documentation

## 2015-01-13 DIAGNOSIS — R0602 Shortness of breath: Secondary | ICD-10-CM

## 2015-01-13 DIAGNOSIS — I5021 Acute systolic (congestive) heart failure: Secondary | ICD-10-CM | POA: Insufficient documentation

## 2015-01-13 DIAGNOSIS — Z79899 Other long term (current) drug therapy: Secondary | ICD-10-CM | POA: Insufficient documentation

## 2015-01-13 DIAGNOSIS — R062 Wheezing: Secondary | ICD-10-CM | POA: Insufficient documentation

## 2015-01-13 DIAGNOSIS — I161 Hypertensive emergency: Secondary | ICD-10-CM

## 2015-01-13 DIAGNOSIS — Z791 Long term (current) use of non-steroidal anti-inflammatories (NSAID): Secondary | ICD-10-CM | POA: Insufficient documentation

## 2015-01-13 DIAGNOSIS — R7989 Other specified abnormal findings of blood chemistry: Secondary | ICD-10-CM | POA: Insufficient documentation

## 2015-01-13 LAB — CBC
HEMATOCRIT: 42.3 % (ref 39.0–52.0)
Hemoglobin: 14.1 g/dL (ref 13.0–17.0)
MCH: 26.4 pg (ref 26.0–34.0)
MCHC: 33.3 g/dL (ref 30.0–36.0)
MCV: 79.2 fL (ref 78.0–100.0)
Platelets: 295 10*3/uL (ref 150–400)
RBC: 5.34 MIL/uL (ref 4.22–5.81)
RDW: 13.5 % (ref 11.5–15.5)
WBC: 8.5 10*3/uL (ref 4.0–10.5)

## 2015-01-13 LAB — COMPREHENSIVE METABOLIC PANEL
ALT: 36 U/L (ref 17–63)
ANION GAP: 11 (ref 5–15)
AST: 34 U/L (ref 15–41)
Albumin: 4.1 g/dL (ref 3.5–5.0)
Alkaline Phosphatase: 82 U/L (ref 38–126)
BUN: 18 mg/dL (ref 6–20)
CHLORIDE: 105 mmol/L (ref 101–111)
CO2: 25 mmol/L (ref 22–32)
CREATININE: 1.47 mg/dL — AB (ref 0.61–1.24)
Calcium: 9.3 mg/dL (ref 8.9–10.3)
GFR, EST NON AFRICAN AMERICAN: 57 mL/min — AB (ref >60)
Glucose, Bld: 109 mg/dL — ABNORMAL HIGH (ref 65–99)
Potassium: 3.2 mmol/L — ABNORMAL LOW (ref 3.5–5.1)
Sodium: 141 mmol/L (ref 135–145)
TOTAL PROTEIN: 7.3 g/dL (ref 6.5–8.1)
Total Bilirubin: 0.4 mg/dL (ref 0.3–1.2)

## 2015-01-13 LAB — I-STAT TROPONIN, ED: Troponin i, poc: 0.19 ng/mL (ref 0.00–0.08)

## 2015-01-13 LAB — BRAIN NATRIURETIC PEPTIDE: B Natriuretic Peptide: 279.8 pg/mL — ABNORMAL HIGH (ref 0.0–100.0)

## 2015-01-13 MED ORDER — NITROPRUSSIDE SODIUM 25 MG/ML IV SOLN
0.0000 ug/kg/min | Freq: Once | INTRAVENOUS | Status: DC
  Filled 2015-01-13: qty 2

## 2015-01-13 NOTE — ED Notes (Signed)
Pt states that he woke up feeling short of breath. Pt states that he has never been diagnosed with asthma, but feels like that is what he has.

## 2015-01-13 NOTE — ED Notes (Addendum)
Pt came out the restroom fully dressed, with labored breathing, stating that there was an emergency at home that needed to be attended to. This RN explained to the pt that if he left, he would be leaving against medical advice, and that his symptoms could worsen and potentially lead to death. Pt acknowledged. Dr. Rhunette Croft spoke with pt and reiterrated that the pt could have worsening of symptoms. Pt states that he will be back for treatment, but needed to go take care of something at home.

## 2015-01-13 NOTE — ED Notes (Signed)
Shown I stat troponin 0.19 to nurse LEANNA  RN.& dr.nanavati

## 2015-01-13 NOTE — ED Provider Notes (Signed)
CSN: [id number]     Arrival date & time 01/13/15  3437 History  This chart was scribed for Derwood Kaplan, MD by Freida Busman, ED Scribe. This patient was seen in room B17C/B17C and the patient's care was started 3:50 AM.    Chief Complaint  Patient presents with  . Shortness of Breath    The history is provided by the patient. No language interpreter was used.     HPI Comments:  Cody [name] is a 44 y.o. male with a h/o HTN who presents to the Emergency Department complaining of intermittent episodes of SOB, cough and wheezing that started a few months ago. He notes symptoms have progressively worsened and today's episode is the worst he has ever had. He reports associated orthopnea and DOE when his SOB begins. He notes he was watching TV when today's episode began.  Pt denies CP, h/o asthma, CHF, blood clots. No alleviating factors noted. Pt is not a smoker. He notes he is currenlty out of his HTN medication.   Past Medical History  Diagnosis Date  . Hypertension    History reviewed. No pertinent past surgical history. History reviewed. No pertinent family history. History  Substance Use Topics  . Smoking status: Never Smoker   . Smokeless tobacco: Not on file  . Alcohol Use: No    Review of Systems  Constitutional: Negative for fever.  Respiratory: Positive for cough, shortness of breath and wheezing.   Cardiovascular: Negative for chest pain.  All other systems reviewed and are negative.     Allergies  Review of patient's allergies indicates no known allergies.  Home Medications   Prior to Admission medications   Medication Sig Start Date End Date Taking? Authorizing Provider  HYDROcodone-acetaminophen (NORCO/VICODIN) 5-325 MG per tablet Take 1-2 tablets by mouth every 4 (four) hours as needed for moderate pain or severe pain. 04/08/14   Blake Divine, MD  lisinopril (PRINIVIL,ZESTRIL) 10 MG tablet Take 1 tablet (10 mg total) by mouth daily. 03/09/14   Geoffery Lyons,  MD  metoprolol (LOPRESSOR) 50 MG tablet Take 1 tablet (50 mg total) by mouth 2 (two) times daily. 03/09/14   Geoffery Lyons, MD  naproxen (NAPROSYN) 500 MG tablet Take 1 tablet (500 mg total) by mouth 2 (two) times daily with a meal. 04/08/14   Blake Divine, MD   BP 232/127 mmHg  Pulse 90  Temp(Src) 98.4 F (36.9 C) (Oral)  Resp 28  Ht 6\' 1"  (1.854 m)  Wt 283 lb (128.368 kg)  BMI 37.35 kg/m2  SpO2 98% Physical Exam  Constitutional: He is oriented to person, place, and time. He appears well-developed and well-nourished.  HENT:  Head: Normocephalic and atraumatic.  Eyes: Conjunctivae are normal.  Neck: Normal range of motion. Neck supple. No JVD present.  Cardiovascular: Normal rate, regular rhythm and normal heart sounds.   Pulmonary/Chest: Effort normal. He has no wheezes.  Diminished air sounds at the right base   Abdominal: Soft. He exhibits no distension.  Musculoskeletal:  No calf TTP No unilateral swelling or pitting edema   Neurological: He is alert and oriented to person, place, and time.  Skin: Skin is warm and dry.  Psychiatric: He has a normal mood and affect.  Nursing note and vitals reviewed.   ED Course  Procedures   DIAGNOSTIC STUDIES:  Oxygen Saturation is 98% on RA, normal by my interpretation.    COORDINATION OF CARE:  3:57 AM Will order CXR. Discussed treatment plan with pt at bedside and  pt agreed to plan.  Labs Review Labs Reviewed  I-STAT TROPOININ, ED - Abnormal; Notable for the following:    Troponin i, poc 0.19 (*)    All other components within normal limits  CBC  COMPREHENSIVE METABOLIC PANEL  BRAIN NATRIURETIC PEPTIDE    Imaging Review Dg Chest Port 1 View  01/13/2015   CLINICAL DATA:  Sudden onset shortness of breath  EXAM: PORTABLE CHEST - 1 VIEW  COMPARISON:  03/09/2014  FINDINGS: Chronic cardiomegaly and mild aortic tortuosity. There is vascular prominence of the hila. Diffuse interstitial opacity above baseline. No pleural effusion.  No acute osseous findings.  IMPRESSION: CHF pattern, mild.   Electronically Signed   By: Marnee Spring M.D.   On: 01/13/2015 04:19     EKG Interpretation   Date/Time:  Saturday Jan 13 2015 03:43:41 EDT Ventricular Rate:  91 PR Interval:  197 QRS Duration: 112 QT Interval:  348 QTC Calculation: 428 R Axis:   70 Text Interpretation:  Sinus rhythm Borderline prolonged PR interval  Biatrial enlargement Incomplete left bundle branch block Left ventricular  hypertrophy Anterior Q waves, possibly due to LVH No significant change  since last tracing Confirmed by Maelynn Moroney, MD, Shalondra Wunschel ([id number]) on 01/13/2015  4:18:24 AM      Pt has elevated troponin. CXR shows CHF like pattern and his EKG has diffuse T wave inversions. Concerns are for HTN emergency. Results discussed with the patient, and ordered arterial line and nipride. Few minutes after our conversation, RN informed me that pt wants to go home for emergency at home. He reports he has to go home to get something taken care of, and he will come right back. Pt has no one else who can help, and the emergency at home cant wait.  Patient wants to leave against medical advice. Patient understands that his/her actions will lead to inadequate medical workup, and that he/she is at risk of complications of missed diagnosis, which includes morbidity and mortality. He has been advised to come back to the ER as soon as possible and immediately if he has chest pain.  Patient is demonstrating good capacity to make decision. Patient understands that he/she needs to return to the ER immediately if his/her symptoms get worse.     MDM   Final diagnoses:  Hypertensive emergency  Elevated troponin  Acute systolic congestive heart failure    I personally performed the services described in this documentation, which was scribed in my presence. The recorded information has been reviewed and is accurate.   Pt comes in with cc of dib. Pt has elevated BP,  MAP around 175 in triage and 150 mmhg when i saw him. He has no chest pain, tight lung exam - but no wheezing. No rales.  Initial concerns are that pt has new onset CHF and possibly hypertensive emergency.  CRITICAL CARE Performed by: Derwood Kaplan   Total critical care time: 35 minutes  Critical care time was exclusive of separately billable procedures and treating other patients.  Critical care was necessary to treat or prevent imminent or life-threatening deterioration.  Critical care was time spent personally by me on the following activities: development of treatment plan with patient and/or surrogate as well as nursing, discussions with consultants, evaluation of patient's response to treatment, examination of patient, obtaining history from patient or surrogate, ordering and performing treatments and interventions, ordering and review of laboratory studies, ordering and review of radiographic studies, pulse oximetry and re-evaluation of patient's condition.  Derwood Kaplan, MD 01/13/15 [id number]

## 2015-01-13 NOTE — ED Notes (Signed)
Nanavait, MD notified of the pt's elevated troponin.

## 2016-01-17 DIAGNOSIS — I82409 Acute embolism and thrombosis of unspecified deep veins of unspecified lower extremity: Secondary | ICD-10-CM

## 2016-01-17 HISTORY — DX: Acute embolism and thrombosis of unspecified deep veins of unspecified lower extremity: I82.409

## 2016-02-20 ENCOUNTER — Inpatient Hospital Stay (HOSPITAL_COMMUNITY)
Admission: EM | Admit: 2016-02-20 | Discharge: 2016-02-22 | DRG: 304 | Disposition: A | Discharge status: Home / Self Care | Payer: Self-pay | Attending: Internal Medicine | Admitting: Internal Medicine

## 2016-02-20 ENCOUNTER — Emergency Department (HOSPITAL_COMMUNITY): Payer: Self-pay

## 2016-02-20 ENCOUNTER — Encounter (HOSPITAL_COMMUNITY): Payer: Self-pay | Admitting: Emergency Medicine

## 2016-02-20 DIAGNOSIS — I110 Hypertensive heart disease with heart failure: Secondary | ICD-10-CM

## 2016-02-20 DIAGNOSIS — Z91199 Patient's noncompliance with other medical treatment and regimen due to unspecified reason: Secondary | ICD-10-CM | POA: Diagnosis present

## 2016-02-20 DIAGNOSIS — I82402 Acute embolism and thrombosis of unspecified deep veins of left lower extremity: Secondary | ICD-10-CM

## 2016-02-20 DIAGNOSIS — N183 Chronic kidney disease, stage 3 unspecified: Secondary | ICD-10-CM | POA: Diagnosis present

## 2016-02-20 DIAGNOSIS — I129 Hypertensive chronic kidney disease with stage 1 through stage 4 chronic kidney disease, or unspecified chronic kidney disease: Secondary | ICD-10-CM | POA: Diagnosis present

## 2016-02-20 DIAGNOSIS — N189 Chronic kidney disease, unspecified: Secondary | ICD-10-CM | POA: Diagnosis present

## 2016-02-20 DIAGNOSIS — N19 Unspecified kidney failure: Secondary | ICD-10-CM

## 2016-02-20 DIAGNOSIS — I161 Hypertensive emergency: Principal | ICD-10-CM | POA: Diagnosis present

## 2016-02-20 DIAGNOSIS — Z7901 Long term (current) use of anticoagulants: Secondary | ICD-10-CM

## 2016-02-20 DIAGNOSIS — Z9119 Patient's noncompliance with other medical treatment and regimen: Secondary | ICD-10-CM

## 2016-02-20 DIAGNOSIS — E876 Hypokalemia: Secondary | ICD-10-CM | POA: Diagnosis present

## 2016-02-20 DIAGNOSIS — R778 Other specified abnormalities of plasma proteins: Secondary | ICD-10-CM | POA: Diagnosis present

## 2016-02-20 DIAGNOSIS — I10 Essential (primary) hypertension: Secondary | ICD-10-CM | POA: Diagnosis present

## 2016-02-20 DIAGNOSIS — I248 Other forms of acute ischemic heart disease: Secondary | ICD-10-CM | POA: Diagnosis present

## 2016-02-20 DIAGNOSIS — I82409 Acute embolism and thrombosis of unspecified deep veins of unspecified lower extremity: Secondary | ICD-10-CM | POA: Diagnosis present

## 2016-02-20 DIAGNOSIS — J810 Acute pulmonary edema: Secondary | ICD-10-CM | POA: Diagnosis present

## 2016-02-20 DIAGNOSIS — R7989 Other specified abnormal findings of blood chemistry: Secondary | ICD-10-CM | POA: Diagnosis present

## 2016-02-20 DIAGNOSIS — I82401 Acute embolism and thrombosis of unspecified deep veins of right lower extremity: Secondary | ICD-10-CM | POA: Diagnosis present

## 2016-02-20 HISTORY — DX: Hypertensive heart disease with heart failure: I11.0

## 2016-02-20 HISTORY — DX: Chronic kidney disease, stage 3 (moderate): N18.3

## 2016-02-20 HISTORY — DX: Acute embolism and thrombosis of unspecified deep veins of unspecified lower extremity: I82.409

## 2016-02-20 HISTORY — DX: Chronic kidney disease, stage 3 unspecified: N18.30

## 2016-02-20 LAB — BASIC METABOLIC PANEL
Anion gap: 9 (ref 5–15)
BUN: 15 mg/dL (ref 6–20)
CALCIUM: 9.5 mg/dL (ref 8.9–10.3)
CHLORIDE: 105 mmol/L (ref 101–111)
CO2: 26 mmol/L (ref 22–32)
CREATININE: 1.69 mg/dL — AB (ref 0.61–1.24)
GFR calc Af Amer: 55 mL/min — ABNORMAL LOW (ref >60)
GFR calc non Af Amer: 48 mL/min — ABNORMAL LOW (ref >60)
Glucose, Bld: 132 mg/dL — ABNORMAL HIGH (ref 65–99)
Potassium: 3.4 mmol/L — ABNORMAL LOW (ref 3.5–5.1)
Sodium: 140 mmol/L (ref 135–145)

## 2016-02-20 LAB — I-STAT TROPONIN, ED: TROPONIN I, POC: 0.21 ng/mL — AB (ref 0.00–0.08)

## 2016-02-20 LAB — TROPONIN I: TROPONIN I: 0.2 ng/mL — AB (ref <0.03)

## 2016-02-20 LAB — CBC
HCT: 45.1 % (ref 39.0–52.0)
Hemoglobin: 14.7 g/dL (ref 13.0–17.0)
MCH: 26.1 pg (ref 26.0–34.0)
MCHC: 32.6 g/dL (ref 30.0–36.0)
MCV: 80 fL (ref 78.0–100.0)
PLATELETS: 332 10*3/uL (ref 150–400)
RBC: 5.64 MIL/uL (ref 4.22–5.81)
RDW: 13.3 % (ref 11.5–15.5)
WBC: 6.9 10*3/uL (ref 4.0–10.5)

## 2016-02-20 LAB — MRSA PCR SCREENING: MRSA BY PCR: NEGATIVE

## 2016-02-20 LAB — BRAIN NATRIURETIC PEPTIDE: B Natriuretic Peptide: 324.9 pg/mL — ABNORMAL HIGH (ref 0.0–100.0)

## 2016-02-20 MED ORDER — ACETAMINOPHEN 650 MG RE SUPP: 650.0000 mg | Freq: Four times a day (QID) | RECTAL | Status: DC | PRN

## 2016-02-20 MED ORDER — LABETALOL HCL 5 MG/ML IV SOLN
10.0000 mg | Freq: Once | INTRAVENOUS | Status: AC
  Administered 2016-02-20: 10 mg via INTRAVENOUS

## 2016-02-20 MED ORDER — LABETALOL HCL 5 MG/ML IV SOLN
10.0000 mg | Freq: Once | INTRAVENOUS | Status: AC
  Administered 2016-02-20: 10 mg via INTRAVENOUS
  Filled 2016-02-20: qty 4

## 2016-02-20 MED ORDER — ASPIRIN 81 MG PO CHEW
324.0000 mg | CHEWABLE_TABLET | Freq: Once | ORAL | Status: AC
  Administered 2016-02-20: 324 mg via ORAL
  Filled 2016-02-20: qty 4

## 2016-02-20 MED ORDER — ONDANSETRON HCL 4 MG PO TABS: 4.0000 mg | ORAL_TABLET | Freq: Four times a day (QID) | ORAL | Status: DC | PRN

## 2016-02-20 MED ORDER — ONDANSETRON HCL 4 MG/2ML IJ SOLN: 4.0000 mg | Freq: Four times a day (QID) | INTRAMUSCULAR | Status: DC | PRN

## 2016-02-20 MED ORDER — HYDRALAZINE HCL 25 MG PO TABS
25.0000 mg | ORAL_TABLET | Freq: Three times a day (TID) | ORAL | Status: DC
Start: 2016-02-20 — End: 2016-02-21
  Administered 2016-02-20 – 2016-02-21 (×2): 25 mg via ORAL
  Filled 2016-02-20 (×2): qty 1

## 2016-02-20 MED ORDER — RIVAROXABAN 20 MG PO TABS
20.0000 mg | ORAL_TABLET | Freq: Every day | ORAL | Status: DC
  Administered 2016-02-20 – 2016-02-21 (×2): 20 mg via ORAL
  Filled 2016-02-20 (×3): qty 1

## 2016-02-20 MED ORDER — POTASSIUM CHLORIDE CRYS ER 20 MEQ PO TBCR
40.0000 meq | EXTENDED_RELEASE_TABLET | Freq: Every day | ORAL | Status: DC
  Administered 2016-02-20 – 2016-02-22 (×3): 40 meq via ORAL
  Filled 2016-02-20 (×3): qty 2

## 2016-02-20 MED ORDER — FUROSEMIDE 10 MG/ML IJ SOLN
40.0000 mg | Freq: Two times a day (BID) | INTRAMUSCULAR | Status: DC
  Administered 2016-02-20 – 2016-02-22 (×4): 40 mg via INTRAVENOUS
  Filled 2016-02-20 (×4): qty 4

## 2016-02-20 MED ORDER — SODIUM CHLORIDE 0.9% FLUSH
3.0000 mL | Freq: Two times a day (BID) | INTRAVENOUS | Status: DC
  Administered 2016-02-20 – 2016-02-22 (×4): 3 mL via INTRAVENOUS

## 2016-02-20 MED ORDER — NITROGLYCERIN IN D5W 200-5 MCG/ML-% IV SOLN
0.0000 ug/min | Freq: Once | INTRAVENOUS | Status: AC
  Administered 2016-02-20: 5 ug/min via INTRAVENOUS
  Filled 2016-02-20: qty 250

## 2016-02-20 MED ORDER — CARVEDILOL 6.25 MG PO TABS
6.2500 mg | ORAL_TABLET | Freq: Two times a day (BID) | ORAL | Status: DC
  Administered 2016-02-20 – 2016-02-21 (×2): 6.25 mg via ORAL
  Filled 2016-02-20 (×2): qty 1

## 2016-02-20 MED ORDER — NITROGLYCERIN IN D5W 200-5 MCG/ML-% IV SOLN
0.0000 ug/min | INTRAVENOUS | Status: DC
  Administered 2016-02-20: 15 ug/min via INTRAVENOUS

## 2016-02-20 MED ORDER — NITROGLYCERIN IN D5W 200-5 MCG/ML-% IV SOLN: 0.0000 ug/min | INTRAVENOUS | Status: DC

## 2016-02-20 MED ORDER — LABETALOL HCL 5 MG/ML IV SOLN: 5.0000 mg | Freq: Once | INTRAVENOUS | Status: DC

## 2016-02-20 MED ORDER — NITROGLYCERIN 0.4 MG SL SUBL
0.4000 mg | SUBLINGUAL_TABLET | SUBLINGUAL | Status: AC | PRN
  Administered 2016-02-20 (×3): 0.4 mg via SUBLINGUAL
  Filled 2016-02-20: qty 1

## 2016-02-20 MED ORDER — ACETAMINOPHEN 325 MG PO TABS: 650.0000 mg | ORAL_TABLET | Freq: Four times a day (QID) | ORAL | Status: DC | PRN

## 2016-02-20 NOTE — ED Notes (Signed)
Pt is in x-ray, will come to room when finished.

## 2016-02-20 NOTE — Progress Notes (Signed)
02/20/2016 patient transfer from the emergency room to 2central at 1830. He is alert, orient and ambulatory. Patient skin is find and heels dry. Patient arrive on floor he was on a nitoglycerin gtt for high blood pressure. [facility] RN.

## 2016-02-20 NOTE — ED Provider Notes (Signed)
CSN: [id number]     Arrival date & time 02/20/16  1438 History   First MD Initiated Contact with Patient 02/20/16 1505     Chief Complaint  Patient presents with  . Leg Swelling  . Shortness of Breath     (Consider location/radiation/quality/duration/timing/severity/associated sxs/prior Treatment) Patient is a 45 y.o. male presenting with shortness of breath. The history is provided by the patient and medical records.  Shortness of Breath Associated symptoms: headaches     45 y.o. M with hx of HTN, presenting to the ED for right foot pain/swelling, headache, and SOB.  Patient states for the past week he has felt increasing SOB. He states this is more related with exertion or lying flat in bed to sleep.  He states at rest he still feels that he is "breathing heavy" but much better than during exertional activities.  He denies any chest pain.  States he has developed some swelling of his feet, more so the right foot.  He also notes a mild headache which he states is more like an annoyance than pain.  He states localized to the top of his head, throbbing in nature.  Denies dizziness, weakness, numbness, confusion, visual disturbance, changes in speech, difficulty ambulating, or facial droop.  No cardiac hx.  No hx of TIA or stroke.  Not currently on anti-coagulation.  VSS.  Past Medical History  Diagnosis Date  . Hypertension    History reviewed. No pertinent past surgical history. History reviewed. No pertinent family history. Social History  Substance Use Topics  . Smoking status: Never Smoker   . Smokeless tobacco: None  . Alcohol Use: No    Review of Systems  Respiratory: Positive for shortness of breath.   Neurological: Positive for headaches.  All other systems reviewed and are negative.     Allergies  Review of patient's allergies indicates no known allergies.  Home Medications   Prior to Admission medications   Medication Sig Start Date End Date Taking? Authorizing  Provider  HYDROcodone-acetaminophen (NORCO/VICODIN) 5-325 MG per tablet Take 1-2 tablets by mouth every 4 (four) hours as needed for moderate pain or severe pain. 04/08/14   Blake Divine, MD  lisinopril (PRINIVIL,ZESTRIL) 10 MG tablet Take 1 tablet (10 mg total) by mouth daily. 03/09/14   Geoffery Lyons, MD  metoprolol (LOPRESSOR) 50 MG tablet Take 1 tablet (50 mg total) by mouth 2 (two) times daily. 03/09/14   Geoffery Lyons, MD  naproxen (NAPROSYN) 500 MG tablet Take 1 tablet (500 mg total) by mouth 2 (two) times daily with a meal. 04/08/14   Blake Divine, MD   BP 240/146 mmHg  Pulse 66  Temp(Src) 98.3 F (36.8 C) (Oral)  Resp 18  SpO2 97%   Physical Exam  Constitutional: He is oriented to person, place, and time. He appears well-developed and well-nourished. No distress.  HENT:  Head: Normocephalic and atraumatic.  Mouth/Throat: Oropharynx is clear and moist.  Eyes: Conjunctivae and EOM are normal. Pupils are equal, round, and reactive to light.  Neck: Normal range of motion. Neck supple.  Cardiovascular: Normal rate, regular rhythm and normal heart sounds.   Pulmonary/Chest: Effort normal and breath sounds normal. No respiratory distress. He has no wheezes. He has no rhonchi.  No distress, lungs overall clear  Abdominal: Soft. Bowel sounds are normal. There is no tenderness. There is no guarding.  Musculoskeletal: Normal range of motion.  Trace edema bilateral ankles and feet  Neurological: He is alert and oriented to person, place, and  time.  AAOx3, answering questions and following commands appropriately; equal strength UE and LE bilaterally; CN grossly intact; moves all extremities appropriately without ataxia; no focal neuro deficits or facial asymmetry appreciated  Skin: Skin is warm and dry. He is not diaphoretic.  Psychiatric: He has a normal mood and affect.  Nursing note and vitals reviewed.   ED Course  Procedures (including critical care time)  CRITICAL CARE Performed by:  Garlon Hatchet   Total critical care time: 45 minutes  Critical care time was exclusive of separately billable procedures and treating other patients.  Critical care was necessary to treat or prevent imminent or life-threatening deterioration.  Critical care was time spent personally by me on the following activities: development of treatment plan with patient and/or surrogate as well as nursing, discussions with consultants, evaluation of patient's response to treatment, examination of patient, obtaining history from patient or surrogate, ordering and performing treatments and interventions, ordering and review of laboratory studies, ordering and review of radiographic studies, pulse oximetry and re-evaluation of patient's condition.  Medications  furosemide (LASIX) injection 40 mg (not administered)  carvedilol (COREG) tablet 6.25 mg (not administered)  hydrALAZINE (APRESOLINE) tablet 25 mg (not administered)  rivaroxaban (XARELTO) tablet 20 mg (not administered)  aspirin chewable tablet 324 mg (324 mg Oral Given 02/20/16 1544)  nitroGLYCERIN (NITROSTAT) SL tablet 0.4 mg (0.4 mg Sublingual Given 02/20/16 1606)  labetalol (NORMODYNE,TRANDATE) injection 10 mg (10 mg Intravenous Given 02/20/16 1623)  labetalol (NORMODYNE,TRANDATE) injection 10 mg (10 mg Intravenous Given 02/20/16 1659)  nitroGLYCERIN 50 mg in dextrose 5 % 250 mL (0.2 mg/mL) infusion (10 mcg/min Intravenous Rate/Dose Change 02/20/16 1727)    Labs Review Labs Reviewed  BASIC METABOLIC PANEL - Abnormal; Notable for the following:    Potassium 3.4 (*)    Glucose, Bld 132 (*)    Creatinine, Ser 1.69 (*)    GFR calc non Af Amer 48 (*)    GFR calc Af Amer 55 (*)    All other components within normal limits  BRAIN NATRIURETIC PEPTIDE - Abnormal; Notable for the following:    B Natriuretic Peptide 324.9 (*)    All other components within normal limits  I-STAT TROPOININ, ED - Abnormal; Notable for the following:    Troponin i, poc  0.21 (*)    All other components within normal limits  CBC  TROPONIN I    Imaging Review Dg Chest 2 View  02/20/2016  CLINICAL DATA:  Shortness of breath for 2 days.  Hypertension. EXAM: CHEST  2 VIEW COMPARISON:  Jan 13, 2015 FINDINGS: There is no edema or consolidation. Heart is slightly enlarged with pulmonary vascularity within normal limits. No adenopathy. No bone lesions. IMPRESSION: Mild cardiac enlargement.  No edema or consolidation. Electronically Signed   By: Bretta Bang III M.D.   On: 02/20/2016 15:10   I have personally reviewed and evaluated these images and lab results as part of my medical decision-making.   EKG Interpretation   Date/Time:  Wednesday February 20 2016 14:46:42 EDT Ventricular Rate:  66 PR Interval:  174 QRS Duration: 122 QT Interval:  408 QTC Calculation: 427 R Axis:   34 Text Interpretation:  Normal sinus rhythm Possible Left atrial enlargement  Left ventricular hypertrophy with QRS widening and repolarization  abnormality Abnormal ECG similar to prior Confirmed by FLOYD MD, Reuel Boom  ([id number]) on 02/20/2016 2:51:08 PM Also confirmed by Adela Lank MD, DANIEL [phone]),  editor Dan Humphreys, CCT, SANDRA ([id number])  on 02/20/2016 3:18:29 PM  MDM   Final diagnoses:  Hypertensive emergency  Elevated troponin  Elevated serum creatinine   45 year old male here with hypertensive emergency. Blood pressure has likely been elevated in this range for several months as his last ED visit resulted in the same, however he signed out AMA.  EKG today unchanged.  Labs with elevated troponin and SrCr.  Patient's symptoms are concerning for new CHF.  Bedside ultrasound confirms.  BNP elevated at 324.  BP has improved with NTG.  Given full dose ASA as well.  Cardiology will see in consult for elevated trop and CHF.  Patient admitted to unassigned medicine service for further management.  Step-down, obs, Michael Litter.  Temp admit orders placed.  BP continues to improve.  Garlon Hatchet, PA-C 02/20/16 1819  Lyndal Pulley, MD 02/21/16 1612  Lyndal Pulley, MD 02/21/16 [phone]

## 2016-02-20 NOTE — Consult Note (Signed)
Consult Note    Patient ID: KYHEIM VUNCANNON MRN: [id number], DOB/AGE: 27-May-1971   Admit date: 02/20/2016   Primary Physician: No PCP Per Patient Primary Cardiologist: New - seen by M. Anne Fu, MD   Patient Profile    45 y/o ? with a h/o poorly controlled HTN and recent dx of RLE dvt, who presented to [facility] ED today with a several month h/o progressive DOE.  Past Medical History    Past Medical History  Diagnosis Date  . Hypertensive heart disease with CHF (congestive heart failure) (HCC)   . DVT (deep venous thrombosis) (HCC)     a. RLE - Dx 01/2016 in [name] - Took Xarelto for 30 days - out x 2 wks.    History reviewed. No pertinent past surgical history.   Allergies  No Known Allergies  History of Present Illness    45 y/o ? with a h/o HTN, previously eval in the [facility] ED as far back as 2010 for HTN and dyspnea.  He was last seen in the [facility] ED in 12/2014, @ which time he c/o dyspnea and wheezing.  His BP was 232/127.  Admission was recommended but he left AMA.  He says that since then, he has been taking lisinopril (doesn't know dose but our records indicate that he was prev Rx 10 mg daily) and that he felt pretty good with no dyspnea for a few mos after that ER visit in 12/2015, but over the past 6 mos or more, he has noted return of DOE with reduced exercise tolerance.  He had been living in [place] until recently and says that he has been seen in the ED there on a few occasions with dyspnea and HTN.  He says that the ER refills his lisinopril.  He does not have a PCP.  About 6-8 wks ago, he developed significant RLE and calf pain.  He was seen in the ED in [place] and was dx with a DVT.  He was placed on xarelto and given a card for 30 days of free xarelto.  He completed that course but couldn't afford to fill the subsequent Rx, and thus hasn't been on any anticoagulation for at least the past 2 wks.    Since moving to [facility], he has continued to have DOE and this has progressed  to the point that walking 10 or 15 feet has become bothersome.  7/4 was a particularly bad day and so he decided to come to the ED today.  Here, he is markedly hypertensive, with BP as high as 190/130.  He is not having c/p or dyspnea @ rest.  ECG is notable for LVH.  CXR shows cardiomegaly.  BNP is mildly elevated @ 324.9.  Trop is 0.21.  Bedside echo by ED staff preliminarily shows mod reduced EF.  We've been asked to eval.  Home Medications    Prior to Admission medications   Medication Sig Start Date End Date Taking? Authorizing Provider  lisinopril (PRINIVIL,ZESTRIL) 10 MG tablet Take 1 tablet (10 mg total) by mouth daily. 03/09/14  Yes Geoffery Lyons, MD   Family History    Family History  Problem Relation Age of Onset  . Asthma Mother     alive and well  . Hypertension Father     alive  . Lung cancer Father     Social History    Social History   Social History  . Marital Status: Single    Spouse Name: N/A  . Number  of Children: N/A  . Years of Education: N/A   Occupational History  . Not on file.   Social History Main Topics  . Smoking status: Never Smoker   . Smokeless tobacco: Not on file  . Alcohol Use: No  . Drug Use: No  . Sexual Activity: Not on file   Other Topics Concern  . Not on file   Social History Narrative   Lives in [place].  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     Review of Systems    General:  No chills, fever, night sweats or weight changes.  Cardiovascular:  No chest pain, +++ dyspnea on exertion, +++ RLE edema in setting of recent DVT, no orthopnea, palpitations, paroxysmal nocturnal dyspnea. Dermatological: No rash, lesions/masses Respiratory: No cough, +++ dyspnea Urologic: No hematuria, dysuria Abdominal:   No nausea, vomiting, diarrhea, bright red blood per rectum, melena, or hematemesis Neurologic:  No visual changes, wkns, changes in mental status. All other systems reviewed and are otherwise negative  except as noted above.  Physical Exam    Blood pressure 180/123, pulse 63, temperature 98.3 F (36.8 C), temperature source Oral, resp. rate 17, SpO2 98 %.  General: Pleasant, NAD Psych: Normal affect. Neuro: Alert and oriented X 3. Moves all extremities spontaneously. HEENT: Normal  Neck: Supple without bruits.  Difficult to gauge jvp 2/2 girth. Lungs:  Resp regular and unlabored, CTA. Heart: RRR, + S4. 2/6 SEM along left sternal border. Abdomen: Soft, non-tender, non-distended, BS + x 4.  Extremities: No clubbing, cyanosis or edema. DP/PT/Radials 2+ and equal bilaterally.  Labs    Troponin [facility] Test)  Recent Labs  02/20/16 1501  TROPIPOC 0.21*    Recent Labs  02/20/16 1453  TROPONINI 0.20*   Lab Results  Component Value Date   WBC 6.9 02/20/2016   HGB 14.7 02/20/2016   HCT 45.1 02/20/2016   MCV 80.0 02/20/2016   PLT 332 02/20/2016     Recent Labs Lab 02/20/16 1453  NA 140  K 3.4*  CL 105  CO2 26  BUN 15  CREATININE 1.69*  CALCIUM 9.5  GLUCOSE 132*    Radiology Studies    Dg Chest 2 View  02/20/2016  CLINICAL DATA:  Shortness of breath for 2 days.  Hypertension. EXAM: CHEST  2 VIEW COMPARISON:  Jan 13, 2015 FINDINGS: There is no edema or consolidation. Heart is slightly enlarged with pulmonary vascularity within normal limits. No adenopathy. No bone lesions. IMPRESSION: Mild cardiac enlargement.  No edema or consolidation. Electronically Signed   By: Bretta Bang III M.D.   On: 02/20/2016 15:10    ECG & Cardiac Imaging    RSR, 66, LVH w/  repol abnormality.  Assessment & Plan    1.  Hypertensive Urgency/Acute CHF (presumably combined systolic/diastolic):  Pt presents with a several month h/o progressive DOE that worsened on 7/4, prompting presentation to the ED today.  He has a known h/o poorly controlled HTN and says that he has been taking lisinopril @ home, though he doesn't have a PCP and has been getting refills from an ED in  [place].  He admits to frequenting fast food places on a daily basis.  He denies any recent h/o chest pain, pnd, or orthopnea.  BP's have been trending in the 180's-190's/120's-130's in the ED.  So far, he has received a total of 20 mg of labetalol along with sl NTG.  Bedside echo suggest moderate LV  dysfxn per ER note.  BNP mildly elevated.  Trop 0.21.  He is in no distress currently.  I will add PO coreg and hydralazine.  Hold off on acei given renal insufficiency of unknown chronicity.  Adding IV NTG and Lasix as well.  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  He will need aggressive counseling re: lifestyle, nutrition, compliance, symptom reporting, and follow-up.  2.  RLE DVT:  Pt was dx w/ RLE DVT ~ 6-8 wks ago by his report.  He took xarelto for 30 days but did not fill subsequent Rx.  If DVT was acute, he should have been on 15 bid for 21 days followed by 20 daily, though he says that he was only ever on it once a day.  I will resume Xarelto 20 daily and he will need LE U/S with a low threshold to treat w/ 15 bid if clot still noted.  Etiology of DVT unclear.  No recent prolonged travel.  He says that he walks a lot both at work and @ home.  3.  CKD II-III:  Creat up @ 1.69.  Creat was 1.47 in 12/2014 and 1.37 in 02/2014, thus current elevation likely represents progression of renal dzs in the setting of ongoing, poorly controlled HTN.  We can consider renal artery duplex as outpt pending response to medical mgmt - assuming compliance.  Signed, Nicolasa Ducking, NP 02/20/2016, 5:15 PM    Personally seen and examined. Agree with above.  45 year old with long standing uncontrolled HTN now effecting renal function (CKD 3), morbid obesity.   - Gently correct BP. Starting coreg. Hydral. IV lasix (fluid overload likely playing role as well).   - Diet modification (low salt)  - ECHO  - LE U/s  (had Xarelto given to him in [place] ER for DVT)  - SEM, +S4. No increased work of breathing at rest.  Worried about him log term, progressing CKD with HTN cardiomyopathy. Discussed with him.   Donato Schultz, MD

## 2016-02-20 NOTE — ED Notes (Signed)
Pt sts right leg swelling x 3 days and now SOB with HA

## 2016-02-20 NOTE — ED Provider Notes (Signed)
Medical screening examination/treatment/procedure(s) were conducted as a shared visit with non-physician practitioner(s) and myself.  I personally evaluated the patient during the encounter.   EKG Interpretation   Date/Time:  Wednesday February 20 2016 14:46:42 EDT Ventricular Rate:  66 PR Interval:  174 QRS Duration: 122 QT Interval:  408 QTC Calculation: 427 R Axis:   34 Text Interpretation:  Normal sinus rhythm Possible Left atrial enlargement  Left ventricular hypertrophy with QRS widening and repolarization  abnormality Abnormal ECG similar to prior Confirmed by FLOYD MD, Reuel Boom  ([id number]) on 02/20/2016 2:51:08 PM Also confirmed by FLOYD MD, Reuel Boom [phone]),  editor Dan Humphreys, CCT, SANDRA ([id number])  on 02/20/2016 3:18:29 PM      45 y.o. male presents with shortness of breath, severely hypertensive with multiple signs of heart failure likely 2/2 hypertensive crisis. Slow BP reduction with NTG, admit for stabilization, formal echo and serial troponins.  See related encounter note  Emergency Focused Ultrasound Exam Limited Ultrasound of the Heart and Pericardium  Performed and interpreted by Dr. Clydene Pugh Indication: shortness of breath Multiple views of the heart, pericardium, and IVC are obtained with a multi frequency probe.  Findings: moderately depressed contractility, no anechoic fluid, variable IVC collapse, severely thickened myocardium and ventricular filling abnormality Interpretation: moderately reduced ejection fraction, no pericardial effusion, no depressed CVP, diastolic dysfunction Images archived electronically.  CPT Code: 10258   Lyndal Pulley, MD 02/20/16 [phone]

## 2016-02-20 NOTE — H&P (Signed)
History and Physical    Cody [name] XBM:[id number] DOB: 1971-04-04 DOA: 02/20/2016  PCP: No PCP Per Patient   Patient coming from: Home  Chief Complaint: Shortness of breath and headache  HPI: Cody [name] is a 45 y.o. gentleman with a history of accelerated hypertension and medical noncompliance who was diagnosed with a RLE DVT six weeks ago in [place].  He was given a prescription for a 30 day supply of the medication and was advised to find a PCP for follow-up or return to the ED.  He subsequently moved to [facility] and has not presented for medical attention until today.  He has been out of Xarelto for approximately two weeks.  He reports today complaining of shortness of breath and headache (without vision disturbance) for the past 3-4 days.  No significant chest pain.  He has a history of intermittent lower extremity edema.  He has had worsening, recurrent swelling in the right leg.  No nausea or vomiting.  No syncope.  ED Course: The patient has evidence of LVH on his EKG.  His first troponin is elevated at 0.2.  BNP is elevated to 324.  Chest xray shows mild cardiac enlargement but no edema or consolidation.  Systolic blood pressure were greater than 200 upon arrival.  Improved to 170's with SL NTG, labetalol  IV, and ultimately nitroglycerin drip.  Cardiology has consulted on the patient.  Bedside echo suggests reduced EF.  Hospitalist asked to admit.  Review of Systems: As per HPI otherwise 10 point review of systems negative.    Past Medical History  Diagnosis Date  . Hypertensive heart disease with CHF (congestive heart failure) (HCC)   . DVT (deep venous thrombosis) (HCC)     a. RLE - Dx 01/2016 in [name] - Took Xarelto for 30 days - out x 2 wks.    History reviewed. No pertinent past surgical history.  He denies past surgeries.   reports that he has never smoked. He does not have any smokeless tobacco history on file. He reports that he does not drink alcohol or use  illicit drugs.  He is married.  He has three children.  He works in a Radio broadcast assistant.  No Known Allergies  Family History  Problem Relation Age of Onset  . Asthma Mother     alive and well  . Hypertension Father     alive  . Lung cancer Father   Denies any known family history of premature CAD.   Prior to Admission medications   Medication Sig Start Date End Date Taking? Authorizing Provider  lisinopril (PRINIVIL,ZESTRIL) 10 MG tablet Take 1 tablet (10 mg total) by mouth daily. 03/09/14  Yes Geoffery Lyons, MD  metoprolol (LOPRESSOR) 50 MG tablet Take 1 tablet (50 mg total) by mouth 2 (two) times daily. Patient not taking: Reported on 02/20/2016 03/09/14   Geoffery Lyons, MD    Physical Exam: Filed Vitals:   02/20/16 1726 02/20/16 1730 02/20/16 1745 02/20/16 1800  BP: 180/127 187/133 179/131 182/126  Pulse:  70 64 67  Temp:    98.4 F (36.9 C)  TempSrc:    Oral  Resp:  Height:     (1.854 m)  Weight:    118.5 kg (261 lb 3.9 oz)  SpO2:  99% 100% 100%      Constitutional: NAD, calm, comfortable Filed Vitals:   02/20/16 1726 02/20/16 1730 02/20/16 1745 02/20/16 1800  BP: 180/127 187/133 179/131 182/126  Pulse:  70 64 67  Temp:    98.4 F (36.9 C)  TempSrc:    Oral  Resp:  17 18 16   Height:    6\' 1"  (1.854 m)  Weight:    118.5 kg (261 lb 3.9 oz)  SpO2:  99% 100% 100%   Eyes: PERRL, lids and conjunctivae normal ENMT: Mucous membranes are moist. Posterior pharynx clear of any exudate or lesions. Normal dentition.  Neck: normal appearance, supple, no masses Respiratory: clear to auscultation bilaterally, no wheezing, no crackles. Normal respiratory effort. No accessory muscle use.  Cardiovascular: Normal rate, regular rhythm, no murmurs / rubs / gallops. 1+ bilateral lower extremity edema. 2+ pedal pulses.  GI: abdomen is soft and compressible.  No distention.  No tenderness.  No masses palpated.  Bowel sounds are present. Musculoskeletal:  No joint deformity in  upper and lower extremities. Good ROM, no contractures. Normal muscle tone.  Skin: no rashes, warm and dry Neurologic: CN 2-12 grossly intact. Sensation intact, Strength symmetric bilaterally, 5/5  Psychiatric: Normal judgment and insight. Alert and oriented x 3. Normal mood.     Labs on Admission: I have personally reviewed following labs and imaging studies  CBC:  Recent Labs Lab 02/20/16 1453  WBC 6.9  HGB 14.7  HCT 45.1  MCV 80.0  PLT 332   Basic Metabolic Panel:  Recent Labs Lab 02/20/16 1453  NA 140  K 3.4*  CL 105  CO2 26  GLUCOSE 132*  BUN 15  CREATININE 1.69*  CALCIUM 9.5   GFR: Estimated Creatinine Clearance: 75.2 mL/min (by C-G formula based on Cr of 1.69). Cardiac Enzymes:  Recent Labs Lab 02/20/16 1453  TROPONINI 0.20*    Radiological Exams on Admission: Dg Chest 2 View  02/20/2016  CLINICAL DATA:  Shortness of breath for 2 days.  Hypertension. EXAM: CHEST  2 VIEW COMPARISON:  Jan 13, 2015 FINDINGS: There is no edema or consolidation. Heart is slightly enlarged with pulmonary vascularity within normal limits. No adenopathy. No bone lesions. IMPRESSION: Mild cardiac enlargement.  No edema or consolidation. Electronically Signed   By: Bretta Bang III M.D.   On: 02/20/2016 15:10    EKG: Independently reviewed. NSR.  LVH.  No acute ST elevation.  Assessment/Plan Principal Problem:   Hypertensive emergency Active Problems:   DVT (deep venous thrombosis) (HCC)   Elevated troponin   CKD (chronic kidney disease) stage 3, GFR 30-59 ml/min   Accelerated hypertension   Hypokalemia      Hypertensive emergency with probable acute CHF --Admit to stepdown unit --Diuresis with IV lasix per cardiology --Oral anti-hypertensive regimen per cardiology.  Avoiding ACE-I for now due to renal insufficiency.  Wean nitroglycerin drip.  Target systolic blood pressure for now is 160-180. --Follow troponin --Complete echo --Cardiology to decide about  ischemic evaluation  Recent diagnosis of RLE DVT --Resume Xarelto now --Agree with lower extremity U/S to assess clot burden --If respiratory status declines or there is evidence of right heart strain on echo, low threshold to screen for PE, though patient already on anticoagulant.  Hypokalemia --KCl daily ordered for now, since he is on IV lasix.  He may need more. --CMP in the AM  Renal insufficiency, suspect CKD secondary to HTN --Follow trend --Will not hydrate aggressively right now since accelerated blood pressures and diuresing for acute CHF --Consider renal ultrasound  History of medical noncompliance --he will need a PCP referral   DVT prophylaxis: On Xarelto Code Status: FULL Family Communication: Patient alone at  time of admission Disposition Plan: Expect he will go home when stable Consults called: Cardiology Admission status: Observation, stepdown   TIME SPENT: 60 minutes   Jerene Bears MD [facility] Pager [phone]  If 7PM-7AM, please contact night-coverage [email or web address] Password [facility]  02/20/2016, 6:53 PM

## 2016-02-21 ENCOUNTER — Observation Stay (HOSPITAL_BASED_OUTPATIENT_CLINIC_OR_DEPARTMENT_OTHER): Payer: Self-pay

## 2016-02-21 ENCOUNTER — Observation Stay (HOSPITAL_COMMUNITY): Payer: Self-pay

## 2016-02-21 DIAGNOSIS — R7989 Other specified abnormal findings of blood chemistry: Secondary | ICD-10-CM

## 2016-02-21 DIAGNOSIS — I10 Essential (primary) hypertension: Secondary | ICD-10-CM

## 2016-02-21 DIAGNOSIS — N183 Chronic kidney disease, stage 3 (moderate): Secondary | ICD-10-CM

## 2016-02-21 DIAGNOSIS — N189 Chronic kidney disease, unspecified: Secondary | ICD-10-CM

## 2016-02-21 DIAGNOSIS — Z9119 Patient's noncompliance with other medical treatment and regimen: Secondary | ICD-10-CM | POA: Diagnosis present

## 2016-02-21 DIAGNOSIS — I509 Heart failure, unspecified: Secondary | ICD-10-CM

## 2016-02-21 DIAGNOSIS — Z91199 Patient's noncompliance with other medical treatment and regimen due to unspecified reason: Secondary | ICD-10-CM | POA: Diagnosis present

## 2016-02-21 DIAGNOSIS — I82409 Acute embolism and thrombosis of unspecified deep veins of unspecified lower extremity: Secondary | ICD-10-CM

## 2016-02-21 LAB — COMPREHENSIVE METABOLIC PANEL
ALBUMIN: 3.3 g/dL — AB (ref 3.5–5.0)
ALT: 19 U/L (ref 17–63)
ANION GAP: 8 (ref 5–15)
AST: 23 U/L (ref 15–41)
Alkaline Phosphatase: 60 U/L (ref 38–126)
BUN: 17 mg/dL (ref 6–20)
CHLORIDE: 103 mmol/L (ref 101–111)
CO2: 31 mmol/L (ref 22–32)
Calcium: 9.2 mg/dL (ref 8.9–10.3)
Creatinine, Ser: 1.79 mg/dL — ABNORMAL HIGH (ref 0.61–1.24)
GFR calc Af Amer: 51 mL/min — ABNORMAL LOW (ref >60)
GFR calc non Af Amer: 44 mL/min — ABNORMAL LOW (ref >60)
GLUCOSE: 95 mg/dL (ref 65–99)
POTASSIUM: 4 mmol/L (ref 3.5–5.1)
SODIUM: 142 mmol/L (ref 135–145)
TOTAL PROTEIN: 6.1 g/dL — AB (ref 6.5–8.1)
Total Bilirubin: 0.8 mg/dL (ref 0.3–1.2)

## 2016-02-21 LAB — ECHOCARDIOGRAM COMPLETE
CHL CUP DOP CALC LVOT VTI: 16 cm
CHL CUP MV DEC (S): 134
E decel time: 134 msec
EERAT: 17.49
FS: 19 % — AB (ref 28–44)
HEIGHTINCHES: 73 in
IV/PV OW: 0.98
LA diam end sys: 40 mm
LA diam index: 1.59 cm/m2
LA vol A4C: 105 ml
LASIZE: 40 mm
LAVOL: 99.2 mL
LAVOLIN: 39.4 mL/m2
LDCA: 3.46 cm2
LV E/e'average: 17.49
LV PW d: 22.3 mm — AB (ref 0.6–1.1)
LV SIMPSON'S DISK: 39
LV TDI E'MEDIAL: 4.24
LV dias vol index: 57 mL/m2
LV dias vol: 143 mL (ref 62–150)
LV sys vol: 87 mL — AB (ref 21–61)
LVEEMED: 17.49
LVELAT: 5.66 cm/s
LVOT diameter: 21 mm
LVOTPV: 96.1 cm/s
LVOTSV: 55 mL
LVSYSVOLIN: 34 mL/m2
Lateral S' vel: 8.03 cm/s
MV pk E vel: 99 m/s
MVPG: 4 mmHg
RV TAPSE: 14.7 mm
Stroke v: 56 ml
TDI e' lateral: 5.66
WEIGHTICAEL: 4204.8 [oz_av]

## 2016-02-21 LAB — CBC
HCT: 43.3 % (ref 39.0–52.0)
Hemoglobin: 13.7 g/dL (ref 13.0–17.0)
MCH: 25.5 pg — ABNORMAL LOW (ref 26.0–34.0)
MCHC: 31.6 g/dL (ref 30.0–36.0)
MCV: 80.6 fL (ref 78.0–100.0)
PLATELETS: 300 10*3/uL (ref 150–400)
RBC: 5.37 MIL/uL (ref 4.22–5.81)
RDW: 13.5 % (ref 11.5–15.5)
WBC: 7.4 10*3/uL (ref 4.0–10.5)

## 2016-02-21 LAB — PROTIME-INR
INR: 1.88 — ABNORMAL HIGH (ref 0.00–1.49)
Prothrombin Time: 21.6 seconds — ABNORMAL HIGH (ref 11.6–15.2)

## 2016-02-21 MED ORDER — AMLODIPINE BESYLATE 5 MG PO TABS
5.0000 mg | ORAL_TABLET | Freq: Every day | ORAL | Status: DC
  Administered 2016-02-21 – 2016-02-22 (×2): 5 mg via ORAL
  Filled 2016-02-21 (×2): qty 1

## 2016-02-21 MED ORDER — CARVEDILOL 6.25 MG PO TABS
6.2500 mg | ORAL_TABLET | Freq: Once | ORAL | Status: AC
  Administered 2016-02-21: 6.25 mg via ORAL
  Filled 2016-02-21: qty 1

## 2016-02-21 MED ORDER — CARVEDILOL 12.5 MG PO TABS
12.5000 mg | ORAL_TABLET | Freq: Two times a day (BID) | ORAL | Status: DC
  Administered 2016-02-21 – 2016-02-22 (×2): 12.5 mg via ORAL
  Filled 2016-02-21 (×2): qty 1

## 2016-02-21 MED ORDER — HYDRALAZINE HCL 50 MG PO TABS
50.0000 mg | ORAL_TABLET | Freq: Three times a day (TID) | ORAL | Status: DC
  Administered 2016-02-21 – 2016-02-22 (×4): 50 mg via ORAL
  Filled 2016-02-21 (×4): qty 1

## 2016-02-21 MED ORDER — PERFLUTREN LIPID MICROSPHERE
1.0000 mL | INTRAVENOUS | Status: AC | PRN
  Administered 2016-02-21: 4 mL via INTRAVENOUS
  Filled 2016-02-21: qty 10

## 2016-02-21 NOTE — Progress Notes (Signed)
  Echocardiogram 2D Echocardiogram with Definity  has been performed.  Cody [name] 02/21/2016, 12:15 PM

## 2016-02-21 NOTE — Care Management Note (Addendum)
Case Management Note  Patient Details  Name: Cody [name] MRN: [id number] Date of Birth: 07-Jan-1971  Subjective/Objective:  Pt has no insurance but started a new job and will have insurance in 2 months.  His lasix, carvedilol, and hydralazine can be purchased for $4.00 for 30-day supply @ [facility].  Provided coupon for $12.67 that he can present @ [facility] for 30-day supply of Norvasc, also provided patient assistance program application for ongoing supply.  Discussed clinics and pt would like to follow @ [facility] and they will transition pt to [facility] and [facility] when there is an opening - has appointment at the Clinic for Tuesday, 8/15 @ 2:30 PM.  Contacted Xarelto representative who will provide Xarelto samples to [facility] for pt to pick up after he is discharged.                       Kesha Hurrell, [place] T, RN 02/21/2016, 3:22 PM

## 2016-02-21 NOTE — Progress Notes (Signed)
PROGRESS NOTE    Cody [name]  ZOX:[id number] DOB: 01/10/1971 DOA: 02/20/2016 PCP: No PCP Per Patient   Brief Narrative:  45 y.o. BM PMHx Accelerated HTN, Medical Noncompliance, RLE DVT six weeks ago in [place].   He was given a prescription for a 30 day supply of the medication and was advised to find a PCP for follow-up or return to the ED. He subsequently moved to [facility] and has not presented for medical attention until today. He has been out of Xarelto for approximately two weeks. He reports today complaining of shortness of breath and headache (without vision disturbance) for the past 3-4 days. No significant chest pain. He has a history of intermittent lower extremity edema. He has had worsening, recurrent swelling in the right leg. No nausea or vomiting. No syncope.   Assessment & Plan:   Principal Problem:   Hypertensive emergency Active Problems:   DVT (deep venous thrombosis) (HCC)   Elevated troponin   CKD (chronic kidney disease) stage 3, GFR 30-59 ml/min   Accelerated hypertension   Hypokalemia   CKD (chronic kidney disease)   H/O noncompliance with medical treatment, presenting hazards to health   Hypertensive emergency with probable acute CHF -Diuresis with IV lasix per cardiology --Amlodipine 5 mg daily; will most likely need to increase to 10 mg -Spoke with Dr. Donato Schultz cardiology, given patient's lack of compliance with medication would prefer to stay away from Clonidine to avoid rebound hypertension -Target SBP 160-180. --Positive troponin most likely secondary to demand ischemia. Patient currently asymptomatic  --Echocardiogram pending   Recent diagnosis of RLE DVT --Resume Xarelto now -Had a long talk with patient concerning implants with anticoagulation. Counseled patient that failure to comply Would result in his DEATH. --Bilateral lower extremity Doppler pending to assess clot burden --If respiratory status declines or there is evidence  of right heart strain on echo, low threshold to screen for PE, though patient already on anticoagulant.  Hypokalemia -Resolved  CKD   -suspect CKD secondary to HTN Lab Results  Component Value Date   CREATININE 1.79* 02/21/2016   CREATININE 1.69* 02/20/2016   CREATININE 1.47* 01/13/2015  -Renal ultrasound pending  History of medical noncompliance --he will need a PCP referral; CSW consult placed   DVT prophylaxis: Xarelto Code Status: Full Family Communication: None Disposition Plan: Resolution HTN   Consultants:  Dr. Donato Schultz cardiology    Procedures/Significant Events:  Echocardiogram pending Bilateral lower extremity Doppler pending  Cultures NA  Antimicrobials: NA   Devices NA   LINES / TUBES:  NA    Continuous Infusions: . nitroGLYCERIN Stopped (02/21/16 0010)     Subjective: 7/6 A/O 4, NAD. Sitting in chair comfortably. Admits to being noncompliant with BP medication and anticoagulation medication.     Objective: Filed Vitals:   02/21/16 1004 02/21/16 1200 02/21/16 1533 02/21/16 1700  BP: 177/106 157/114 160/103 171/103  Pulse: 70 58 65 69  Temp:  98.2 F (36.8 C) 98.2 F (36.8 C)   TempSrc:  Oral Oral   Resp:  Height:      Weight:      SpO2: 97% 98%  96%    Intake/Output Summary (Last 24 hours) at 02/21/16 1942 Last data filed at 02/21/16 1900  Gross per 24 hour  Intake 1523.7 ml  Output   3200 ml  Net -1676.3 ml   Filed Weights   02/20/16 1800 02/21/16 0500  Weight: 118.5 kg (261 lb 3.9 oz) 119.205 kg (262 lb  12.8 oz)    Examination:  General: A/O 4, NAD, No acute respiratory distress Eyes: negative scleral hemorrhage, negative anisocoria, negative icterus ENT: Negative Runny nose, negative gingival bleeding, Neck:  Negative scars, masses, torticollis, lymphadenopathy, JVD Lungs: Clear to auscultation bilaterally without wheezes or crackles Cardiovascular: Regular rate and rhythm without murmur gallop  or rub normal S1 and S2 Abdomen: negative abdominal pain, nondistended, positive soft, bowel sounds, no rebound, no ascites, no appreciable mass Extremities: No significant cyanosis, clubbing, or edema bilateral lower extremities Skin: Negative rashes, lesions, ulcers Psychiatric:  Negative depression, negative anxiety, negative fatigue, negative mania  Central nervous system:  Cranial nerves II through XII intact, tongue/uvula midline, all extremities muscle strength 5/5, sensation intact throughout, negative dysarthria, negative expressive aphasia, negative receptive aphasia.  .     Data Reviewed: Care during the described time interval was provided by me .  I have reviewed this patient's available data, including medical history, events of note, physical examination, and all test results as part of my evaluation. I have personally reviewed and interpreted all radiology studies.  CBC:  Recent Labs Lab 02/20/16 1453 02/21/16 0445  WBC 6.9 7.4  HGB 14.7 13.7  HCT 45.1 43.3  MCV 80.0 80.6  PLT 332 300   Basic Metabolic Panel:  Recent Labs Lab 02/20/16 1453 02/21/16 0445  NA 140 142  K 3.4* 4.0  CL 105 103  CO2 26 31  GLUCOSE 132* 95  BUN 15 17  CREATININE 1.69* 1.79*  CALCIUM 9.5 9.2   GFR: Estimated Creatinine Clearance: 71.2 mL/min (by C-G formula based on Cr of 1.79). Liver Function Tests:  Recent Labs Lab 02/21/16 0445  AST 23  ALT 19  ALKPHOS 60  BILITOT 0.8  PROT 6.1*  ALBUMIN 3.3*   No results for input(s): LIPASE, AMYLASE in the last 168 hours. No results for input(s): AMMONIA in the last 168 hours. Coagulation Profile:  Recent Labs Lab 02/21/16 0445  INR 1.88*   Cardiac Enzymes:  Recent Labs Lab 02/20/16 1453  TROPONINI 0.20*   BNP (last 3 results) No results for input(s): PROBNP in the last 8760 hours. HbA1C: No results for input(s): HGBA1C in the last 72 hours. CBG: No results for input(s): GLUCAP in the last 168 hours. Lipid  Profile: No results for input(s): CHOL, HDL, LDLCALC, TRIG, CHOLHDL, LDLDIRECT in the last 72 hours. Thyroid Function Tests: No results for input(s): TSH, T4TOTAL, FREET4, T3FREE, THYROIDAB in the last 72 hours. Anemia Panel: No results for input(s): VITAMINB12, FOLATE, FERRITIN, TIBC, IRON, RETICCTPCT in the last 72 hours. Urine analysis: No results found for: COLORURINE, APPEARANCEUR, LABSPEC, PHURINE, GLUCOSEU, HGBUR, BILIRUBINUR, KETONESUR, PROTEINUR, UROBILINOGEN, NITRITE, LEUKOCYTESUR Sepsis Labs: @LABRCNTIP (procalcitonin:4,lacticidven:4)  ) Recent Results (from the past 240 hour(s))  MRSA PCR Screening     Status: None   Collection Time: 02/20/16  6:46 PM  Result Value Ref Range Status   MRSA by PCR NEGATIVE NEGATIVE Final    Comment:        The GeneXpert MRSA Assay (FDA approved for NASAL specimens only), is one component of a comprehensive MRSA colonization surveillance program. It is not intended to diagnose MRSA infection nor to guide or monitor treatment for MRSA infections.          Radiology Studies: Dg Chest 2 View  02/20/2016  CLINICAL DATA:  Shortness of breath for 2 days.  Hypertension. EXAM: CHEST  2 VIEW COMPARISON:  Jan 13, 2015 FINDINGS: There is no edema or consolidation. Heart is slightly enlarged  with pulmonary vascularity within normal limits. No adenopathy. No bone lesions. IMPRESSION: Mild cardiac enlargement.  No edema or consolidation. Electronically Signed   By: Bretta Bang III M.D.   On: 02/20/2016 15:10   US Renal  02/21/2016  CLINICAL DATA:  Patient with malignant hypertension and renal failure. EXAM: RENAL / URINARY TRACT ULTRASOUND COMPLETE COMPARISON:  None. FINDINGS: Right Kidney: Length: 12.4 cm. Borderline increased parenchymal echogenicity. 2.3 x 1.9 x 2.1 cm cyst arises from the lower pole. No other masses, no stones and no hydronephrosis. Left Kidney: Length: 13.5 cm. Echogenicity within normal limits. No mass or hydronephrosis  visualized. Bladder: Appears normal for degree of bladder distention. Prostate mildly enlarged measuring 4.6 x 3.8 x 4.8 cm. IMPRESSION: 1. No acute finding.  No hydronephrosis. 2. Borderline increased right kidney renal parenchymal echogenicity suggesting medical renal disease. 3. 2.3 cm right renal cyst. Electronically Signed   By: Amie Portland M.D.   On: 02/21/2016 16:02        Scheduled Meds: . amLODipine  5 mg Oral Daily  . carvedilol  12.5 mg Oral BID WC  . furosemide  40 mg Intravenous BID  . hydrALAZINE  50 mg Oral Q8H  . potassium chloride  40 mEq Oral Daily  . rivaroxaban  20 mg Oral Q supper  . sodium chloride flush  3 mL Intravenous Q12H   Continuous Infusions: . nitroGLYCERIN Stopped (02/21/16 0010)     LOS: 0 days    Time spent: 40 minutes    WOODS, Roselind Messier, MD [facility] Pager [phone]   If 7PM-7AM, please contact night-coverage [email or web address] Password [facility] 02/21/2016, 7:42 PM

## 2016-02-21 NOTE — Progress Notes (Signed)
VASCULAR LAB PRELIMINARY  PRELIMINARY  PRELIMINARY  PRELIMINARY  Right lower extremity venous duplex completed.    Preliminary report:  There is age indeterminate DVT noted in the right distal femoral vein.  There appears to be collateral flow noted around thrombus.  No other thrombus noted.  Adelfa Lozito, RVT 02/21/2016, 2:28 PM

## 2016-02-21 NOTE — Progress Notes (Signed)
MD Joseph Art made aware of patient high BP post coreg dose. MD advised new orders coming soon to try and lower BP. Patient is in no distress, sitting in chair, no c/o pain. All other VS besides BP stable. Will continue to monitor patient closely.

## 2016-02-21 NOTE — Clinical Documentation Improvement (Signed)
Internal Medicine  Can the diagnosis of " acute CHF" be further specified? Thank you    Acuity - Acute, Chronic, Acute on Chronic   Type - Systolic, Diastolic, Systolic and Diastolic  Other  Clinically Undetermined   Document any associated diagnoses/conditions HTN ER   Supporting Information: BNP 324.9, SOB    Please exercise your independent, professional judgment when responding. A specific answer is not anticipated or expected.   Thank You,  Lavonda Jumbo [employer] Information Management [place] [phone]

## 2016-02-21 NOTE — Progress Notes (Signed)
Cardiologist: Anne Fu Subjective:  Still quite jovial, hoping to be able to go home tomorrow. No chest pain, no shortness of breath  Objective:  Vital Signs in the last 24 hours: Temp:  [97.6 F (36.4 C)-98.4 F (36.9 C)] 97.8 F (36.6 C) (07/06 0835) Pulse Rate:  [52-94] 70 (07/06 1004) Resp:  [14-24] 17 (07/06 0844) BP: (136-240)/(64-146) 177/106 mmHg (07/06 1004) SpO2:  [91 %-100 %] 97 % (07/06 1004) Weight:  [261 lb 3.9 oz (118.5 kg)-262 lb 12.8 oz (119.205 kg)] 262 lb 12.8 oz (119.205 kg) (07/06 0500)  Intake/Output from previous day: 07/05 0701 - 07/06 0700 In: 468.2 [P.O.:444; I.V.:24.2] Out: 800 [Urine:800]   Physical Exam: General: Well developed, well nourished, in no acute distress. Head:  Normocephalic and atraumatic. Lungs: Clear to auscultation and percussion. Heart: Normal S1 and S2. 2/6 systolic murmur, no rubs, positive S4  Abdomen: soft, non-tender, positive bowel sounds. Overweight Extremities: No clubbing or cyanosis. No edema. Neurologic: Alert and oriented x 3.    Lab Results:  Recent Labs  02/20/16 1453 02/21/16 0445  WBC 6.9 7.4  HGB 14.7 13.7  PLT 332 300    Recent Labs  02/20/16 1453 02/21/16 0445  NA 140 142  K 3.4* 4.0  CL 105 103  CO2 26 31  GLUCOSE 132* 95  BUN 15 17  CREATININE 1.69* 1.79*    Recent Labs  02/20/16 1453  TROPONINI 0.20*   Hepatic Function Panel  Recent Labs  02/21/16 0445  PROT 6.1*  ALBUMIN 3.3*  AST 23  ALT 19  ALKPHOS 60  BILITOT 0.8   No results for input(s): CHOL in the last 72 hours. No results for input(s): PROTIME in the last 72 hours.  Imaging: Dg Chest 2 View  02/20/2016  CLINICAL DATA:  Shortness of breath for 2 days.  Hypertension. EXAM: CHEST  2 VIEW COMPARISON:  Jan 13, 2015 FINDINGS: There is no edema or consolidation. Heart is slightly enlarged with pulmonary vascularity within normal limits. No adenopathy. No bone lesions. IMPRESSION: Mild cardiac enlargement.  No edema or  consolidation. Electronically Signed   By: Bretta Bang III M.D.   On: 02/20/2016 15:10   Personally viewed.   Telemetry: Sinus rhythm LVH Personally viewed.   EKG:  LVH with repolarization abnormality Personally viewed.  Cardiac Studies:  Echo pending, lower extremity Doppler pending  Meds: Scheduled Meds: . carvedilol  6.25 mg Oral BID WC  . furosemide  40 mg Intravenous BID  . hydrALAZINE  25 mg Oral Q8H  . potassium chloride  40 mEq Oral Daily  . rivaroxaban  20 mg Oral Q supper  . sodium chloride flush  3 mL Intravenous Q12H   Continuous Infusions: . nitroGLYCERIN Stopped (02/21/16 0010)   PRN Meds:.acetaminophen **OR** acetaminophen, ondansetron **OR** ondansetron (ZOFRAN) IV  Assessment/Plan:  Principal Problem:   Hypertensive emergency Active Problems:   DVT (deep venous thrombosis) (HCC)   Elevated troponin   CKD (chronic kidney disease) stage 3, GFR 30-59 ml/min   Accelerated hypertension   Hypokalemia   45 year old with uncontrolled hypertension for several years, severe LVH, hypertensive heart disease with heart failure, demand ischemia secondary to accelerated hypertension, chronic kidney disease stage III.  Accelerated hypertension, hypertensive urgency  - Increasing antihypertensives, we will increase carvedilol to 12.5 mg twice a day, heart rate currently 76 when sitting  - Continue with IV furosemide for today, hopefully volume status will help improve blood pressure  - We will add amlodipine 5 mg  -  Still avoiding ACE inhibitor because of creatinine  - Increasing hydralazine to 50 every 8  - Avoiding clonidine secondary to potential rebound  - Discussed with Dr. Carolyne Littles  Recent DVT  - Awaiting lower extremity Doppler  Elevated troponin  - Demand ischemia in the setting of hypertension. No ischemic workup at this time. No chest pain.  Chronic kidney disease stage III  - Elevated slightly this morning. Creatinine.  Donato Schultz 02/21/2016, 10:17 AM

## 2016-02-22 DIAGNOSIS — I82401 Acute embolism and thrombosis of unspecified deep veins of right lower extremity: Secondary | ICD-10-CM

## 2016-02-22 DIAGNOSIS — Z9119 Patient's noncompliance with other medical treatment and regimen: Secondary | ICD-10-CM

## 2016-02-22 MED ORDER — HYDRALAZINE HCL 50 MG PO TABS: 50.0000 mg | ORAL_TABLET | Freq: Three times a day (TID) | ORAL | Status: DC

## 2016-02-22 MED ORDER — FUROSEMIDE 40 MG PO TABS: 40.0000 mg | ORAL_TABLET | Freq: Every day | ORAL | Status: DC

## 2016-02-22 MED ORDER — AMLODIPINE BESYLATE 5 MG PO TABS: 5.0000 mg | ORAL_TABLET | Freq: Every day | ORAL | Status: DC

## 2016-02-22 MED ORDER — CARVEDILOL 12.5 MG PO TABS: 12.5000 mg | ORAL_TABLET | Freq: Two times a day (BID) | ORAL | Status: DC

## 2016-02-22 MED ORDER — RIVAROXABAN 20 MG PO TABS: 20.0000 mg | ORAL_TABLET | Freq: Every day | ORAL | Status: DC

## 2016-02-22 MED ORDER — POTASSIUM CHLORIDE CRYS ER 20 MEQ PO TBCR: 20.0000 meq | EXTENDED_RELEASE_TABLET | Freq: Every day | ORAL | Status: DC

## 2016-02-22 MED ORDER — FUROSEMIDE 40 MG PO TABS
40.0000 mg | ORAL_TABLET | Freq: Every day | ORAL | Status: DC
  Administered 2016-02-22: 40 mg via ORAL
  Filled 2016-02-22: qty 1

## 2016-02-22 NOTE — Progress Notes (Signed)
Cardiologist: Anne Fu Subjective:   No chest pain, no shortness of breath. BP improved.   Objective:  Vital Signs in the last 24 hours: Temp:  [98 F (36.7 C)-98.3 F (36.8 C)] 98.3 F (36.8 C) (07/07 0800) Pulse Rate:  [51-78] 60 (07/07 0800) Resp:  [14-22] 14 (07/07 0800) BP: (131-177)/(80-114) 139/87 mmHg (07/07 0800) SpO2:  [95 %-99 %] 97 % (07/07 0800) Weight:  [259 lb 4.8 oz (117.618 kg)] 259 lb 4.8 oz (117.618 kg) (07/07 0500)  Intake/Output from previous day: 07/06 0701 - 07/07 0700 In: 1282 [P.O.:1282] Out: 3425 [Urine:3425]   Physical Exam: General: Well developed, well nourished, in no acute distress. Head:  Normocephalic and atraumatic. Lungs: Clear to auscultation and percussion. Heart: Normal S1 and S2. 2/6 systolic murmur, no rubs, positive S4  Abdomen: soft, non-tender, positive bowel sounds. Overweight Extremities: No clubbing or cyanosis. No edema. Neurologic: Alert and oriented x 3.    Lab Results:  Recent Labs  02/20/16 1453 02/21/16 0445  WBC 6.9 7.4  HGB 14.7 13.7  PLT 332 300    Recent Labs  02/20/16 1453 02/21/16 0445  NA 140 142  K 3.4* 4.0  CL 105 103  CO2 26 31  GLUCOSE 132* 95  BUN 15 17  CREATININE 1.69* 1.79*    Recent Labs  02/20/16 1453  TROPONINI 0.20*   Hepatic Function Panel  Recent Labs  02/21/16 0445  PROT 6.1*  ALBUMIN 3.3*  AST 23  ALT 19  ALKPHOS 60  BILITOT 0.8    Imaging: Dg Chest 2 View  02/20/2016  CLINICAL DATA:  Shortness of breath for 2 days.  Hypertension. EXAM: CHEST  2 VIEW COMPARISON:  Jan 13, 2015 FINDINGS: There is no edema or consolidation. Heart is slightly enlarged with pulmonary vascularity within normal limits. No adenopathy. No bone lesions. IMPRESSION: Mild cardiac enlargement.  No edema or consolidation. Electronically Signed   By: Bretta Bang III M.D.   On: 02/20/2016 15:10   US Renal  02/21/2016  CLINICAL DATA:  Patient with malignant hypertension and renal failure.  EXAM: RENAL / URINARY TRACT ULTRASOUND COMPLETE COMPARISON:  None. FINDINGS: Right Kidney: Length: 12.4 cm. Borderline increased parenchymal echogenicity. 2.3 x 1.9 x 2.1 cm cyst arises from the lower pole. No other masses, no stones and no hydronephrosis. Left Kidney: Length: 13.5 cm. Echogenicity within normal limits. No mass or hydronephrosis visualized. Bladder: Appears normal for degree of bladder distention. Prostate mildly enlarged measuring 4.6 x 3.8 x 4.8 cm. IMPRESSION: 1. No acute finding.  No hydronephrosis. 2. Borderline increased right kidney renal parenchymal echogenicity suggesting medical renal disease. 3. 2.3 cm right renal cyst. Electronically Signed   By: Amie Portland M.D.   On: 02/21/2016 16:02   Personally viewed.   Telemetry: Sinus rhythm LVH Personally viewed.   EKG:  LVH with repolarization abnormality Personally viewed.  Cardiac Studies:  Age indeter fem V DVT. ECHO: severe LVH EF 50-55%  Meds: Scheduled Meds: . amLODipine  5 mg Oral Daily  . carvedilol  12.5 mg Oral BID WC  . furosemide  40 mg Intravenous BID  . hydrALAZINE  50 mg Oral Q8H  . potassium chloride  40 mEq Oral Daily  . rivaroxaban  20 mg Oral Q supper  . sodium chloride flush  3 mL Intravenous Q12H   Continuous Infusions: . nitroGLYCERIN Stopped (02/21/16 0010)   PRN Meds:.acetaminophen **OR** acetaminophen, ondansetron **OR** ondansetron (ZOFRAN) IV  Assessment/Plan:  Principal Problem:   Hypertensive emergency Active Problems:  DVT (deep venous thrombosis) (HCC)   Elevated troponin   CKD (chronic kidney disease) stage 3, GFR 30-59 ml/min   Accelerated hypertension   Hypokalemia   CKD (chronic kidney disease)   H/O noncompliance with medical treatment, presenting hazards to health   45 year old with uncontrolled hypertension for several years, severe LVH, hypertensive heart disease with heart failure, demand ischemia secondary to accelerated hypertension, chronic kidney disease stage  III.  Accelerated hypertension, hypertensive urgency  - carvedilol to 12.5 mg twice a day  - Change to po furosemide 40 QD  - amlodipine 5 mg  - Still avoiding ACE inhibitor because of creatinine  - hydralazine to 50 every 8  - Avoiding clonidine secondary to potential rebound  - Discussed on 7/6 with Dr. Carolyne Littles  Recent DVT  - Doppler shows indeterminate age deep vein thrombosis involving the distal femoral vein of the right lower extremity. There appears to be collateral flow at the site of thrombus.  - Xarelto per primary team. (Saw case mgt note)  Elevated troponin  - Demand ischemia in the setting of hypertension. No ischemic workup at this time. No chest pain.  Chronic kidney disease stage III  - Elevated slightly this morning. Creatinine. Changing to PO lasix.   Last BP 139/87 - OK with DC home when comfortable with primary team. Has follow up made.   Cody [name] 02/22/2016, 10:03 AM

## 2016-02-22 NOTE — Discharge Summary (Signed)
DISCHARGE SUMMARY  HELIX LAFONTAINE  MR#: [id number]  DOB:[birth year]  Date of Admission: 02/20/2016 Date of Discharge: 02/22/2016  Attending Physician:MCCLUNG,JEFFREY T  Patient's PCP:No PCP Per Patient  Consults:  [facility]  Disposition: D/C home   Follow-up Appts:     Follow-up Information    Follow up with [facility].   Why:  Appointment Tuesday, August 15th @ 2:30 p.m.   Contact information:   [address] [place] [address]       Tests Needing Follow-up: -recheck BP -f/u K+ w/ pt on diuretic and KCl replacement  -follow renal fxn   Discharge Diagnoses: Hypertensive emergency with acute pulmonary edema (NO CHF on TTE) Recent diagnosis of RLE DVT Hypokalemia CKD  History of medical noncompliance  Initial presentation: 45 y.o. M Hx Accelerated HTN, Medical Noncompliance, and a recently diagnosed RLE DVT six weeks ago in [place] who was given a prescription for a 30 day supply of the medication and was advised to find a PCP for follow-up or return to the ED. He subsequently moved to [facility] and had not yet sought medical attention. He had been out of Xarelto for approximately two weeks. He presented complaining of shortness of breath and headache for the 3-4 days with no chest pain.   Hospital Course:  Hypertensive emergency with acute pulmonary edema (NO CHF on TTE) -Diuresed with IV lasix  -Positive troponin most likely secondary to demand ischemia -Echocardiogram noted severe LVH but no signif decline in EF -Med recs per Cards are as follows: - carvedilol 12.5 mg twice a day - po furosemide 40 QD - amlodipine 5 mg - Still avoiding ACE inhibitor because of elevated creatinine - hydralazine  every 8 - Avoiding clonidine secondary to potential rebound  Recent diagnosis of RLE DVT -Resumed Xarelto  -Dr. Joseph Art had a long talk with patient concerning anticoagulation. Counseled patient that failure  to comply could result in his DEATH. -Bilateral lower extremity Doppler noted known RLE DVT but no other findings   Hypokalemia -Resolved  CKD  -suspect CKD secondary to HTN -Renal ultrasound w/ acute findings   History of medical noncompliance -PCP referral and extensive assistance w/ meds all arranged by CM    Medication List    STOP taking these medications        lisinopril 10 MG tablet  Commonly known as:  PRINIVIL,ZESTRIL     metoprolol 50 MG tablet  Commonly known as:  LOPRESSOR      TAKE these medications        amLODipine 5 MG tablet  Commonly known as:  NORVASC  Take 1 tablet (5 mg total) by mouth daily.     carvedilol 12.5 MG tablet  Commonly known as:  COREG  Take 1 tablet (12.5 mg total) by mouth 2 (two) times daily with a meal.     furosemide 40 MG tablet  Commonly known as:  LASIX  Take 1 tablet (40 mg total) by mouth daily.     hydrALAZINE 50 MG tablet  Commonly known as:  APRESOLINE  Take 1 tablet (50 mg total) by mouth 3 (three) times daily.     potassium chloride SA 20 MEQ tablet  Commonly known as:  K-DUR,KLOR-CON  Take 1 tablet (20 mEq total) by mouth daily.     rivaroxaban 20 MG Tabs tablet  Commonly known as:  XARELTO  Take 1 tablet (20 mg total) by mouth daily with supper.  Day of Discharge BP 129/82 mmHg  Pulse 60  Temp(Src) 98.3 F (36.8 C) (Oral)  Resp 11  Ht 6\' 1"  (1.854 m)  Wt 117.618 kg (259 lb 4.8 oz)  BMI 34.22 kg/m2  SpO2 98%  Physical Exam: General: No acute respiratory distress Lungs: Clear to auscultation bilaterally without wheezes or crackles Cardiovascular: Regular rate and rhythm without murmur gallop or rub normal S1 and S2 Abdomen: Nontender, nondistended, soft, bowel sounds positive, no rebound, no ascites, no appreciable mass Extremities: No significant cyanosis, clubbing, or edema bilateral lower extremities  Basic Metabolic Panel:  Recent Labs Lab 02/20/16 1453 02/21/16 0445  NA 140 142    K 3.4* 4.0  CL 105 103  CO2 26 31  GLUCOSE 132* 95  BUN 15 17  CREATININE 1.69* 1.79*  CALCIUM 9.5 9.2    Liver Function Tests:  Recent Labs Lab 02/21/16 0445  AST 23  ALT 19  ALKPHOS 60  BILITOT 0.8  PROT 6.1*  ALBUMIN 3.3*    Coags:  Recent Labs Lab 02/21/16 0445  INR 1.88*    CBC:  Recent Labs Lab 02/20/16 1453 02/21/16 0445  WBC 6.9 7.4  HGB 14.7 13.7  HCT 45.1 43.3  MCV 80.0 80.6  PLT 332 300    Cardiac Enzymes:  Recent Labs Lab 02/20/16 1453  TROPONINI 0.20*   BNP (last 3 results)  Recent Labs  02/20/16 1453  BNP 324.9*    Recent Results (from the past 240 hour(s))  MRSA PCR Screening     Status: None   Collection Time: 02/20/16  6:46 PM  Result Value Ref Range Status   MRSA by PCR NEGATIVE NEGATIVE Final    Comment:        The GeneXpert MRSA Assay (FDA approved for NASAL specimens only), is one component of a comprehensive MRSA colonization surveillance program. It is not intended to diagnose MRSA infection nor to guide or monitor treatment for MRSA infections.      Time spent in discharge (includes decision making & examination of pt): >30 minutes  02/22/2016, 12:00 PM   Lonia Blood, MD [facility] Office  [phone] Pager [phone]  On-Call/Text Page:      [email or web address]      password [facility]

## 2016-02-22 NOTE — Discharge Instructions (Signed)
Hypertension Hypertension, commonly called high blood pressure, is when the force of blood pumping through your arteries is too strong. Your arteries are the blood vessels that carry blood from your heart throughout your body. A blood pressure reading consists of a higher number over a lower number, such as 110/72. The higher number (systolic) is the pressure inside your arteries when your heart pumps. The lower number (diastolic) is the pressure inside your arteries when your heart relaxes. Ideally you want your blood pressure below 120/80. Hypertension forces your heart to work harder to pump blood. Your arteries may become narrow or stiff. Having untreated or uncontrolled hypertension can cause heart attack, stroke, kidney disease, and other problems. RISK FACTORS Some risk factors for high blood pressure are controllable. Others are not.  Risk factors you cannot control include:   Race. You may be at higher risk if you are African American.  Age. Risk increases with age.  Gender. Men are at higher risk than women before age 45 years. After age 65, women are at higher risk than men. Risk factors you can control include:  Not getting enough exercise or physical activity.  Being overweight.  Getting too much fat, sugar, calories, or salt in your diet.  Drinking too much alcohol. SIGNS AND SYMPTOMS Hypertension does not usually cause signs or symptoms. Extremely high blood pressure (hypertensive crisis) may cause headache, anxiety, shortness of breath, and nosebleed. DIAGNOSIS To check if you have hypertension, your health care provider will measure your blood pressure while you are seated, with your arm held at the level of your heart. It should be measured at least twice using the same arm. Certain conditions can cause a difference in blood pressure between your right and left arms. A blood pressure reading that is higher than normal on one occasion does not mean that you need treatment. If  it is not clear whether you have high blood pressure, you may be asked to return on a different day to have your blood pressure checked again. Or, you may be asked to monitor your blood pressure at home for 1 or more weeks. TREATMENT Treating high blood pressure includes making lifestyle changes and possibly taking medicine. Living a healthy lifestyle can help lower high blood pressure. You may need to change some of your habits. Lifestyle changes may include:  Following the DASH diet. This diet is high in fruits, vegetables, and whole grains. It is low in salt, red meat, and added sugars.  Keep your sodium intake below 2,300 mg per day.  Getting at least 30-45 minutes of aerobic exercise at least 4 times per week.  Losing weight if necessary.  Not smoking.  Limiting alcoholic beverages.  Learning ways to reduce stress. Your health care provider may prescribe medicine if lifestyle changes are not enough to get your blood pressure under control, and if one of the following is true:  You are 18-59 years of age and your systolic blood pressure is above 140.  You are 60 years of age or older, and your systolic blood pressure is above 150.  Your diastolic blood pressure is above 90.  You have diabetes, and your systolic blood pressure is over 140 or your diastolic blood pressure is over 90.  You have kidney disease and your blood pressure is above 140/90.  You have heart disease and your blood pressure is above 140/90. Your personal target blood pressure may vary depending on your medical conditions, your age, and other factors. HOME CARE INSTRUCTIONS    Have your blood pressure rechecked as directed by your health care provider.   Take medicines only as directed by your health care provider. Follow the directions carefully. Blood pressure medicines must be taken as prescribed. The medicine does not work as well when you skip doses. Skipping doses also puts you at risk for  problems.  Do not smoke.   Monitor your blood pressure at home as directed by your health care provider. SEEK MEDICAL CARE IF:   You think you are having a reaction to medicines taken.  You have recurrent headaches or feel dizzy.  You have swelling in your ankles.  You have trouble with your vision. SEEK IMMEDIATE MEDICAL CARE IF:  You develop a severe headache or confusion.  You have unusual weakness, numbness, or feel faint.  You have severe chest or abdominal pain.  You vomit repeatedly.  You have trouble breathing. MAKE SURE YOU:   Understand these instructions.  Will watch your condition.  Will get help right away if you are not doing well or get worse.   This information is not intended to replace advice given to you by your health care provider. Make sure you discuss any questions you have with your health care provider.   Document Released: 08/04/2005 Document Revised: 12/19/2014 Document Reviewed: 05/27/2013 Elsevier Interactive Patient Education 2016 [employer]  

## 2016-02-22 NOTE — Progress Notes (Signed)
Discharge instructions reviewed with patient. Patient verbalized understanding. Patient has all belongings and is in no distress. 6 paper prescriptions given to patient to be filled once he leaves the hospital. Patient verified that he has all of the paperwork from case management and no further questions.

## 2016-04-01 ENCOUNTER — Ambulatory Visit: Payer: Self-pay | Admitting: Family Medicine

## 2016-06-17 ENCOUNTER — Encounter (HOSPITAL_COMMUNITY): Payer: Self-pay | Admitting: Emergency Medicine

## 2016-06-17 ENCOUNTER — Observation Stay (HOSPITAL_COMMUNITY)
Admission: EM | Admit: 2016-06-17 | Discharge: 2016-06-19 | Disposition: A | Discharge status: Home / Self Care | Payer: Self-pay | Attending: Family Medicine | Admitting: Family Medicine

## 2016-06-17 ENCOUNTER — Emergency Department (HOSPITAL_COMMUNITY): Payer: Self-pay

## 2016-06-17 DIAGNOSIS — I429 Cardiomyopathy, unspecified: Secondary | ICD-10-CM | POA: Insufficient documentation

## 2016-06-17 DIAGNOSIS — N183 Chronic kidney disease, stage 3 (moderate): Secondary | ICD-10-CM | POA: Insufficient documentation

## 2016-06-17 DIAGNOSIS — Z9114 Patient's other noncompliance with medication regimen: Secondary | ICD-10-CM | POA: Insufficient documentation

## 2016-06-17 DIAGNOSIS — I130 Hypertensive heart and chronic kidney disease with heart failure and stage 1 through stage 4 chronic kidney disease, or unspecified chronic kidney disease: Secondary | ICD-10-CM | POA: Insufficient documentation

## 2016-06-17 DIAGNOSIS — R0602 Shortness of breath: Secondary | ICD-10-CM | POA: Insufficient documentation

## 2016-06-17 DIAGNOSIS — I82501 Chronic embolism and thrombosis of unspecified deep veins of right lower extremity: Secondary | ICD-10-CM | POA: Insufficient documentation

## 2016-06-17 DIAGNOSIS — Z79899 Other long term (current) drug therapy: Secondary | ICD-10-CM | POA: Insufficient documentation

## 2016-06-17 DIAGNOSIS — I82409 Acute embolism and thrombosis of unspecified deep veins of unspecified lower extremity: Secondary | ICD-10-CM | POA: Diagnosis present

## 2016-06-17 DIAGNOSIS — I160 Hypertensive urgency: Principal | ICD-10-CM | POA: Insufficient documentation

## 2016-06-17 DIAGNOSIS — I10 Essential (primary) hypertension: Secondary | ICD-10-CM | POA: Diagnosis present

## 2016-06-17 DIAGNOSIS — I5032 Chronic diastolic (congestive) heart failure: Secondary | ICD-10-CM | POA: Insufficient documentation

## 2016-06-17 HISTORY — DX: Dyspnea, unspecified: R06.00

## 2016-06-17 LAB — CBC
HEMATOCRIT: 39.2 % (ref 39.0–52.0)
HEMOGLOBIN: 12.7 g/dL — AB (ref 13.0–17.0)
MCH: 26.2 pg (ref 26.0–34.0)
MCHC: 32.4 g/dL (ref 30.0–36.0)
MCV: 80.8 fL (ref 78.0–100.0)
Platelets: 309 10*3/uL (ref 150–400)
RBC: 4.85 MIL/uL (ref 4.22–5.81)
RDW: 13.5 % (ref 11.5–15.5)
WBC: 9.1 10*3/uL (ref 4.0–10.5)

## 2016-06-17 LAB — I-STAT TROPONIN, ED: TROPONIN I, POC: 0.1 ng/mL — AB (ref 0.00–0.08)

## 2016-06-17 LAB — BASIC METABOLIC PANEL
ANION GAP: 9 (ref 5–15)
BUN: 17 mg/dL (ref 6–20)
CALCIUM: 9.1 mg/dL (ref 8.9–10.3)
CO2: 25 mmol/L (ref 22–32)
Chloride: 107 mmol/L (ref 101–111)
Creatinine, Ser: 1.62 mg/dL — ABNORMAL HIGH (ref 0.61–1.24)
GFR calc Af Amer: 58 mL/min — ABNORMAL LOW (ref >60)
GFR calc non Af Amer: 50 mL/min — ABNORMAL LOW (ref >60)
GLUCOSE: 133 mg/dL — AB (ref 65–99)
Potassium: 3.5 mmol/L (ref 3.5–5.1)
Sodium: 141 mmol/L (ref 135–145)

## 2016-06-17 MED ORDER — FUROSEMIDE 20 MG PO TABS
40.0000 mg | ORAL_TABLET | Freq: Once | ORAL | Status: AC
  Administered 2016-06-17: 40 mg via ORAL
  Filled 2016-06-17: qty 2

## 2016-06-17 MED ORDER — HYDRALAZINE HCL 25 MG PO TABS
50.0000 mg | ORAL_TABLET | Freq: Once | ORAL | Status: AC
  Administered 2016-06-17: 50 mg via ORAL
  Filled 2016-06-17: qty 2

## 2016-06-17 MED ORDER — CARVEDILOL 12.5 MG PO TABS
12.5000 mg | ORAL_TABLET | Freq: Once | ORAL | Status: AC
  Administered 2016-06-17: 12.5 mg via ORAL
  Filled 2016-06-17: qty 1

## 2016-06-17 MED ORDER — CARVEDILOL 12.5 MG PO TABS: 12.5000 mg | ORAL_TABLET | Freq: Two times a day (BID) | ORAL | Status: DC

## 2016-06-17 MED ORDER — AMLODIPINE BESYLATE 5 MG PO TABS
5.0000 mg | ORAL_TABLET | Freq: Once | ORAL | Status: AC
  Administered 2016-06-17: 5 mg via ORAL
  Filled 2016-06-17: qty 1

## 2016-06-17 NOTE — ED Triage Notes (Signed)
Tech first notified of PT BP, will move to next available room.

## 2016-06-17 NOTE — ED Triage Notes (Signed)
Pt c/o SOB x2-3 days and checked his Bp today and it said 242/153. Denies headache or dizziness. Hx of HTN, taking medication for it but ran out. States hes been out for a week. Pt in NAD. Denies pain.

## 2016-06-17 NOTE — ED Notes (Signed)
Pt denies any headache, blurred vision, or light headedness

## 2016-06-17 NOTE — ED Notes (Signed)
Charge nurse notified of pt Bp and troponin, next to go back.

## 2016-06-17 NOTE — ED Notes (Signed)
Nurse Inetta Fermo in triage was notified of this critical lab result.... (KT)

## 2016-06-17 NOTE — ED Provider Notes (Signed)
[facility]-EMERGENCY DEPT Provider Note   CSN: [id number] Arrival date & time: 06/17/16  2046     History   Chief Complaint Chief Complaint  Patient presents with  . Hypertension  . Shortness of Breath    HPI Cody [name] is a 45 y.o. male.  Patient presents with worsening shortness of breath on exertion for the last few days. Has a history of hypertension and diastolic heart failure, and was recently diagnosed with a DVT and started on Xarelto. Denies any chest pain or recent fevers. Reports he has been off all his medications for the last several weeks as he has been unable to afford them. Took his blood pressure today at [facility] and found it to be in the 240s systolic.   The history is provided by the patient and the spouse. No language interpreter was used.  Shortness of Breath  This is a recurrent problem. The average episode lasts 3 days. The problem occurs intermittently.The current episode started more than 2 days ago. The problem has been gradually worsening. Associated symptoms include leg swelling. Pertinent negatives include no fever, no cough, no chest pain, no vomiting and no abdominal pain. Precipitated by: Noncompliance with medications. He has tried nothing for the symptoms. The treatment provided no relief. He has had prior hospitalizations. Associated medical issues include heart failure and DVT.    Past Medical History:  Diagnosis Date  . CKD (chronic kidney disease), stage III    [name] 02/20/2016  . DVT (deep venous thrombosis) (HCC) 01/2016   a. RLE - Dx 01/2016 in [name] - Took Xarelto for 30 days - out x 2 wks.  . Hypertension   . Hypertensive heart disease with CHF (congestive heart failure) [facility])     Patient Active Problem List   Diagnosis Date Noted  . Hypertensive emergency 06/18/2016  . H/O noncompliance with medical treatment, presenting hazards to health   . DVT (deep venous thrombosis) (HCC) 02/20/2016  . CKD (chronic kidney disease) stage 3, GFR 30-59  ml/min 02/20/2016  . Accelerated hypertension 02/20/2016    Past Surgical History:  Procedure Laterality Date  . NO PAST SURGERIES         Home Medications    Prior to Admission medications   Medication Sig Start Date End Date Taking? Authorizing Provider  amLODipine (NORVASC) 5 MG tablet Take 1 tablet (5 mg total) by mouth daily. 02/22/16  Yes Lonia Blood, MD  carvedilol (COREG) 12.5 MG tablet Take 1 tablet (12.5 mg total) by mouth 2 (two) times daily with a meal. 02/22/16  Yes Lonia Blood, MD  furosemide (LASIX) 40 MG tablet Take 1 tablet (40 mg total) by mouth daily. 02/22/16  Yes Lonia Blood, MD  hydrALAZINE (APRESOLINE) 50 MG tablet Take 1 tablet (50 mg total) by mouth 3 (three) times daily. 02/22/16  Yes Lonia Blood, MD  potassium chloride SA (K-DUR,KLOR-CON) 20 MEQ tablet Take 1 tablet (20 mEq total) by mouth daily. 02/22/16  Yes Lonia Blood, MD  rivaroxaban (XARELTO) 20 MG TABS tablet Take 1 tablet (20 mg total) by mouth daily with supper. Patient not taking: Reported on 06/17/2016 02/22/16   Lonia Blood, MD    Family History Family History  Problem Relation Age of Onset  . Asthma Mother     alive and well  . Hypertension Father     alive  . Lung cancer Father     Social History Social History  Substance Use Topics  . Smoking  status: Never Smoker  . Smokeless tobacco: Never Used  . Alcohol use No     Allergies   Review of patient's allergies indicates no known allergies.   Review of Systems Review of Systems  Constitutional: Negative for fever.  HENT: Negative.   Respiratory: Positive for shortness of breath. Negative for cough.   Cardiovascular: Positive for leg swelling. Negative for chest pain.  Gastrointestinal: Negative for abdominal pain and vomiting.  Genitourinary: Negative.   Musculoskeletal: Negative.   Skin: Negative.   Allergic/Immunologic: Negative for immunocompromised state.  Neurological: Negative.     Hematological: Does not bruise/bleed easily.  Psychiatric/Behavioral: Negative.      Physical Exam Updated Vital Signs BP (!) 156/92 (BP Location: Left Arm)   Pulse 67   Temp 97.4 F (36.3 C) (Oral)   Resp 16   Ht 6' (1.829 m)   Wt 117.9 kg   SpO2 100%   BMI 35.26 kg/m   Physical Exam  Constitutional: He is oriented to person, place, and time. He appears well-developed and well-nourished. No distress.  HENT:  Head: Normocephalic and atraumatic.  Eyes: Conjunctivae and EOM are normal.  Neck: Normal range of motion. Neck supple.  Cardiovascular: Normal rate, regular rhythm, normal heart sounds and intact distal pulses.  Exam reveals no gallop and no friction rub.   No murmur heard. Pulmonary/Chest: Effort normal. No accessory muscle usage.  Faint bibasilar rales. Breathing comfortably on room air, speaking in full sentences.  Abdominal: Soft. There is no tenderness. There is no rigidity, no rebound and no guarding.  Musculoskeletal:  3+ pitting edema of R leg to knee, 2+ pitting edema of L leg  Neurological: He is alert and oriented to person, place, and time.  Skin: He is not diaphoretic.     ED Treatments / Results  Labs (all labs ordered are listed, but only abnormal results are displayed) Labs Reviewed  BASIC METABOLIC PANEL - Abnormal; Notable for the following:       Result Value   Glucose, Bld 133 (*)    Creatinine, Ser 1.62 (*)    GFR calc non Af Amer 50 (*)    GFR calc Af Amer 58 (*)    All other components within normal limits  CBC - Abnormal; Notable for the following:    Hemoglobin 12.7 (*)    All other components within normal limits  I-STAT TROPOININ, ED - Abnormal; Notable for the following:    Troponin i, poc 0.10 (*)    All other components within normal limits    EKG  EKG Interpretation  Date/Time:  Tuesday June 17 2016 20:53:30 EDT Ventricular Rate:  83 PR Interval:  172 QRS Duration: 112 QT Interval:  404 QTC Calculation: 474 R  Axis:   45 Text Interpretation:  Normal sinus rhythm Possible Left atrial enlargement Incomplete left bundle branch block Left ventricular hypertrophy with repolarization abnormality No significant change since last tracing Confirmed by Anitra Lauth  MD, Alphonzo Lemmings ([id number]) on 06/17/2016 10:46:15 PM Also confirmed by Anitra Lauth  MD, WHITNEY ([id number]), editor [place], Cala Bradford [phone])  on 06/18/2016 7:17:43 AM       Radiology Dg Chest 2 View  Result Date: 06/17/2016 CLINICAL DATA:  Shortness of breath for 2-3 days EXAM: CHEST  2 VIEW COMPARISON:  02/20/2016 FINDINGS: Cardiac shadow remains enlarged. Increased central vascular congestion with mild interstitial edema is noted consistent with CHF. No acute bony abnormality is seen. No sizable effusion is noted. IMPRESSION: Mild CHF. Electronically Signed   By: Loraine Leriche  Lukens M.D.   On: 06/17/2016 21:34    Procedures Procedures (including critical care time)  Medications Ordered in ED Medications  potassium chloride SA (K-DUR,KLOR-CON) CR tablet 20 mEq (20 mEq Oral Given 06/18/16 1021)  hydrALAZINE (APRESOLINE) tablet 50 mg (50 mg Oral Given 06/18/16 1021)  furosemide (LASIX) tablet 40 mg (40 mg Oral Given 06/18/16 1021)  carvedilol (COREG) tablet 12.5 mg (12.5 mg Oral Given 06/18/16 0850)  amLODipine (NORVASC) tablet 5 mg (5 mg Oral Given 06/18/16 1020)  rivaroxaban (XARELTO) tablet 20 mg (20 mg Oral Given 06/18/16 0337)  amLODipine (NORVASC) tablet 5 mg (5 mg Oral Given 06/17/16 2322)  furosemide (LASIX) tablet 40 mg (40 mg Oral Given 06/17/16 2322)  hydrALAZINE (APRESOLINE) tablet 50 mg (50 mg Oral Given 06/17/16 2333)  carvedilol (COREG) tablet 12.5 mg (12.5 mg Oral Given 06/17/16 2351)     Initial Impression / Assessment and Plan / ED Course  I have reviewed the triage vital signs and the nursing notes.  Pertinent labs & imaging results that were available during my care of the patient were reviewed by me and considered in my medical decision making (see  chart for details).  Clinical Course    Patient presents with several days of shortness of breath and hypertension in the setting of noncompliance with antihypertensives and Xarelto due to cost issues. He is overall well-appearing and breathing comfortably while at rest. He is hypertensive to the 230s systolic on arrival. EKG unremarkable without signs of ischemia, he denies chest pain. CXR consistent with mild pulmonary edema. Troponin mildly elevated to 0.1. Symptoms likely related to hypertensive emergency. Do not suspect ACS given unremarkable EKG, lack of chest pain. Do not suspect PE at this time despite history of DVT given acute findings on CXR that explain dyspnea, lack of hypoxia or tachycardia. Blood pressures improved with PO medications. Will be admitted to hospitalist for treatment of hypertensive emergency.  Final Clinical Impressions(s) / ED Diagnoses   Final diagnoses:  Hypertensive urgency  Essential hypertension  Shortness of breath    New Prescriptions Current Discharge Medication List       Preston FleetingAnna Alexxus Sobh, MD 06/18/16 1701    Gwyneth SproutWhitney Plunkett, MD 06/18/16 1738

## 2016-06-18 ENCOUNTER — Encounter (HOSPITAL_COMMUNITY): Payer: Self-pay | Admitting: General Practice

## 2016-06-18 DIAGNOSIS — I10 Essential (primary) hypertension: Secondary | ICD-10-CM

## 2016-06-18 DIAGNOSIS — I161 Hypertensive emergency: Secondary | ICD-10-CM | POA: Insufficient documentation

## 2016-06-18 MED ORDER — CARVEDILOL 12.5 MG PO TABS
12.5000 mg | ORAL_TABLET | Freq: Two times a day (BID) | ORAL | Status: DC
  Administered 2016-06-18 – 2016-06-19 (×3): 12.5 mg via ORAL
  Filled 2016-06-18 (×3): qty 1

## 2016-06-18 MED ORDER — HYDRALAZINE HCL 50 MG PO TABS
50.0000 mg | ORAL_TABLET | Freq: Three times a day (TID) | ORAL | Status: DC
  Administered 2016-06-18 – 2016-06-19 (×5): 50 mg via ORAL
  Filled 2016-06-18: qty 2
  Filled 2016-06-18 (×4): qty 1

## 2016-06-18 MED ORDER — AMLODIPINE BESYLATE 5 MG PO TABS
5.0000 mg | ORAL_TABLET | Freq: Every day | ORAL | Status: DC
  Administered 2016-06-18 – 2016-06-19 (×2): 5 mg via ORAL
  Filled 2016-06-18 (×2): qty 1

## 2016-06-18 MED ORDER — FUROSEMIDE 40 MG PO TABS
40.0000 mg | ORAL_TABLET | Freq: Every day | ORAL | Status: DC
  Administered 2016-06-18 – 2016-06-19 (×2): 40 mg via ORAL
  Filled 2016-06-18: qty 2
  Filled 2016-06-18: qty 1

## 2016-06-18 MED ORDER — RIVAROXABAN 20 MG PO TABS
20.0000 mg | ORAL_TABLET | Freq: Every day | ORAL | Status: DC
  Administered 2016-06-18 (×2): 20 mg via ORAL
  Filled 2016-06-18 (×3): qty 1

## 2016-06-18 MED ORDER — POTASSIUM CHLORIDE CRYS ER 20 MEQ PO TBCR
20.0000 meq | EXTENDED_RELEASE_TABLET | Freq: Every day | ORAL | Status: DC
  Administered 2016-06-18 – 2016-06-19 (×2): 20 meq via ORAL
  Filled 2016-06-18 (×2): qty 1

## 2016-06-18 NOTE — H&P (Signed)
History and Physical    Cody SoursLamar K [name] WUJ:[id number] DOB: November 23, 1970 DOA: 06/17/2016   PCP: No PCP Per Patient Chief Complaint:  Chief Complaint  Patient presents with  . Hypertension  . Shortness of Breath    HPI: Cody SoursLamar K [name] is a 45 y.o. male with medical history significant of accelerated HTN, DVT in June.  Patient has been off of meds due to inability to afford (especially the xarelto) but also he ran out of his HTN meds a week ago.  For the past 2-3 days he has had SOB.  He checked his BP today at home and the meter said 242/153.  He came in to the ED.  ED Course: BP initially 232/151.  Patient is given his home BP meds that he ran out of a week ago, and his BP comes down to 160/104.  Review of Systems: As per HPI otherwise 10 point review of systems negative.    Past Medical History:  Diagnosis Date  . CKD (chronic kidney disease), stage III    [name]/notes 02/20/2016  . DVT (deep venous thrombosis) (HCC) 01/2016   a. RLE - Dx 01/2016 [place] [facility] - Took Xarelto for 30 days - out x 2 wks.  . Hypertension   . Hypertensive heart disease with CHF (congestive heart failure) (HCC)     Past Surgical History:  Procedure Laterality Date  . NO PAST SURGERIES       reports that he has never smoked. He has never used smokeless tobacco. He reports that he does not drink alcohol or use drugs.  No Known Allergies  Family History  Problem Relation Age of Onset  . Asthma Mother     alive and well  . Hypertension Father     alive  . Lung cancer Father       Prior to Admission medications   Medication Sig Start Date End Date Taking? Authorizing Provider  amLODipine (NORVASC) 5 MG tablet Take 1 tablet (5 mg total) by mouth daily. 02/22/16  Yes Lonia BloodJeffrey T McClung, MD  carvedilol (COREG) 12.5 MG tablet Take 1 tablet (12.5 mg total) by mouth 2 (two) times daily with a meal. 02/22/16  Yes Lonia BloodJeffrey T McClung, MD  furosemide (LASIX) 40 MG tablet Take 1 tablet (40 mg total) by mouth daily. 02/22/16   Yes Lonia BloodJeffrey T McClung, MD  hydrALAZINE (APRESOLINE) 50 MG tablet Take 1 tablet (50 mg total) by mouth 3 (three) times daily. 02/22/16  Yes Lonia BloodJeffrey T McClung, MD  potassium chloride SA (K-DUR,KLOR-CON) 20 MEQ tablet Take 1 tablet (20 mEq total) by mouth daily. 02/22/16  Yes Lonia BloodJeffrey T McClung, MD  rivaroxaban (XARELTO) 20 MG TABS tablet Take 1 tablet (20 mg total) by mouth daily with supper. Patient not taking: Reported on 06/17/2016 02/22/16   Lonia BloodJeffrey T McClung, MD    Physical Exam: Vitals:   06/18/16 0045 06/18/16 0100 06/18/16 0115 06/18/16 0130  BP: (!) 179/111 164/99 (!) 167/102 (!) 160/104  Pulse: 86 82 70 63  Resp: 16 19 19 18   Temp:      SpO2: 99% 100% 99% 100%      Constitutional: NAD, calm, comfortable Eyes: PERRL, lids and conjunctivae normal ENMT: Mucous membranes are moist. Posterior pharynx clear of any exudate or lesions.Normal dentition.  Neck: normal, supple, no masses, no thyromegaly Respiratory: clear to auscultation bilaterally, no wheezing, no crackles. Normal respiratory effort. No accessory muscle use.  Cardiovascular: Regular rate and rhythm, no murmurs / rubs / gallops. RLE swelling > left.  2+ pedal pulses. No carotid bruits.  Abdomen: no tenderness, no masses palpated. No hepatosplenomegaly. Bowel sounds positive.  Musculoskeletal: no clubbing / cyanosis. No joint deformity upper and lower extremities. Good ROM, no contractures. Normal muscle tone.  Skin: no rashes, lesions, ulcers. No induration Neurologic: CN 2-12 grossly intact. Sensation intact, DTR normal. Strength 5/5 in all 4.  Psychiatric: Normal judgment and insight. Alert and oriented x 3. Normal mood.    Labs on Admission: I have personally reviewed following labs and imaging studies  CBC:  Recent Labs Lab 06/17/16 2107  WBC 9.1  HGB 12.7*  HCT 39.2  MCV 80.8  PLT 309   Basic Metabolic Panel:  Recent Labs Lab 06/17/16 2107  NA 141  K 3.5  CL 107  CO2 25  GLUCOSE 133*  BUN 17    CREATININE 1.62*  CALCIUM 9.1   GFR: CrCl cannot be calculated (Unknown ideal weight.). Liver Function Tests: No results for input(s): AST, ALT, ALKPHOS, BILITOT, PROT, ALBUMIN in the last 168 hours. No results for input(s): LIPASE, AMYLASE in the last 168 hours. No results for input(s): AMMONIA in the last 168 hours. Coagulation Profile: No results for input(s): INR, PROTIME in the last 168 hours. Cardiac Enzymes: No results for input(s): CKTOTAL, CKMB, CKMBINDEX, TROPONINI in the last 168 hours. BNP (last 3 results) No results for input(s): PROBNP in the last 8760 hours. HbA1C: No results for input(s): HGBA1C in the last 72 hours. CBG: No results for input(s): GLUCAP in the last 168 hours. Lipid Profile: No results for input(s): CHOL, HDL, LDLCALC, TRIG, CHOLHDL, LDLDIRECT in the last 72 hours. Thyroid Function Tests: No results for input(s): TSH, T4TOTAL, FREET4, T3FREE, THYROIDAB in the last 72 hours. Anemia Panel: No results for input(s): VITAMINB12, FOLATE, FERRITIN, TIBC, IRON, RETICCTPCT in the last 72 hours. Urine analysis: No results found for: COLORURINE, APPEARANCEUR, LABSPEC, PHURINE, GLUCOSEU, HGBUR, BILIRUBINUR, KETONESUR, PROTEINUR, UROBILINOGEN, NITRITE, LEUKOCYTESUR Sepsis Labs: @LABRCNTIP (procalcitonin:4,lacticidven:4) )No results found for this or any previous visit (from the past 240 hour(s)).   Radiological Exams on Admission: Dg Chest 2 View  Result Date: 06/17/2016 CLINICAL DATA:  Shortness of breath for 2-3 days EXAM: CHEST  2 VIEW COMPARISON:  02/20/2016 FINDINGS: Cardiac shadow remains enlarged. Increased central vascular congestion with mild interstitial edema is noted consistent with CHF. No acute bony abnormality is seen. No sizable effusion is noted. IMPRESSION: Mild CHF. Electronically Signed   By: Alcide Clever M.D.   On: 06/17/2016 21:34    EKG: Independently reviewed.  Assessment/Plan Principal Problem:   Hypertensive emergency Active  Problems:   Accelerated hypertension    1. HTN urgency -  1. BP improved after home meds restarted 2. Continue home meds 3. All BP meds are about 4 dollars / month 2. DVT in June - 1. His xarelto however, is much more expensive than BP meds 2. And he may very well need some form of help if possible to pay for this 3. Thus I have consulted CM 4. Will restart Xarelto for now. 5. 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   DVT prophylaxis: Xarelto Code Status: Full Family Communication: No family in room Consults called: None Admission status: Place in Obs   [place], Heywood Iles. DO [facility] Pager [phone] from 7PM-7AM  If 7AM-7PM, please contact the day physician for the patient [email or web address] Password [email or web address]  06/18/2016, 1:52 AM

## 2016-06-18 NOTE — Progress Notes (Signed)
ANTICOAGULATION CONSULT NOTE - Initial Consult  Pharmacy Consult for Xarelto Indication: DVT  No Known Allergies  Patient Measurements:    Vital Signs: Temp: 98 F (36.7 C) (10/31 2057) BP: 154/106 (11/01 0215) Pulse Rate: 60 (11/01 0215)  Labs:  Recent Labs  06/17/16 2107  HGB 12.7*  HCT 39.2  PLT 309  CREATININE 1.62*    CrCl cannot be calculated (Unknown ideal weight.).   Medical History: Past Medical History:  Diagnosis Date  . CKD (chronic kidney disease), stage III    Cody [name] 02/20/2016  . DVT (deep venous thrombosis) (HCC) 01/2016   a. RLE - Dx 01/2016 in [name] - Took Xarelto for 30 days - out x 2 wks.  . Hypertension   . Hypertensive heart disease with CHF (congestive heart failure) (HCC)     Medications:  No current facility-administered medications on file prior to encounter.    Current Outpatient Prescriptions on File Prior to Encounter  Medication Sig Dispense Refill  . amLODipine (NORVASC) 5 MG tablet Take 1 tablet (5 mg total) by mouth daily. 30 tablet 0  . carvedilol (COREG) 12.5 MG tablet Take 1 tablet (12.5 mg total) by mouth 2 (two) times daily with a meal. 60 tablet 0  . furosemide (LASIX) 40 MG tablet Take 1 tablet (40 mg total) by mouth daily. 30 tablet 0  . hydrALAZINE (APRESOLINE) 50 MG tablet Take 1 tablet (50 mg total) by mouth 3 (three) times daily. 90 tablet 0  . potassium chloride SA (K-DUR,KLOR-CON) 20 MEQ tablet Take 1 tablet (20 mEq total) by mouth daily. 30 tablet 0  . rivaroxaban (XARELTO) 20 MG TABS tablet Take 1 tablet (20 mg total) by mouth daily with supper. (Patient not taking: Reported on 06/17/2016) 30 tablet 0     Assessment: 45 y.o. male with h/o DVT for Xarelto.    Plan:  Xarelto 20 mg daily  [name] 06/18/2016,2:22 AM

## 2016-06-18 NOTE — ED Notes (Signed)
Pt wakes and denies c/o. Resp even andnon labored , skin warm and dry. Plan of care discussed.

## 2016-06-19 DIAGNOSIS — I82501 Chronic embolism and thrombosis of unspecified deep veins of right lower extremity: Secondary | ICD-10-CM

## 2016-06-19 LAB — BASIC METABOLIC PANEL
ANION GAP: 8 (ref 5–15)
BUN: 13 mg/dL (ref 6–20)
CALCIUM: 9 mg/dL (ref 8.9–10.3)
CO2: 29 mmol/L (ref 22–32)
Chloride: 103 mmol/L (ref 101–111)
Creatinine, Ser: 1.31 mg/dL — ABNORMAL HIGH (ref 0.61–1.24)
GFR calc Af Amer: >60 mL/min (ref >60)
GFR calc non Af Amer: >60 mL/min (ref >60)
GLUCOSE: 96 mg/dL (ref 65–99)
Potassium: 3.4 mmol/L — ABNORMAL LOW (ref 3.5–5.1)
Sodium: 140 mmol/L (ref 135–145)

## 2016-06-19 MED ORDER — AMLODIPINE BESYLATE 5 MG PO TABS
5.0000 mg | ORAL_TABLET | Freq: Every day | ORAL | Status: DC
  Administered 2016-06-19: 5 mg via ORAL
  Filled 2016-06-19: qty 1

## 2016-06-19 MED ORDER — RIVAROXABAN 20 MG PO TABS
20.0000 mg | ORAL_TABLET | Freq: Every day | ORAL | Status: DC
  Administered 2016-06-19: 20 mg via ORAL
  Filled 2016-06-19: qty 1

## 2016-06-19 MED ORDER — AMLODIPINE BESYLATE 10 MG PO TABS: 10.0000 mg | ORAL_TABLET | Freq: Every day | ORAL | 0 refills | Status: DC

## 2016-06-19 MED ORDER — RIVAROXABAN 20 MG PO TABS: 20.0000 mg | ORAL_TABLET | Freq: Every day | ORAL | 0 refills | Status: DC

## 2016-06-19 NOTE — Discharge Instructions (Signed)
Mr. Cody [name], you were watched in the hospital because of your high blood pressure. Your blood pressure medications have been restarted and I have increased your amlodipine to 10mg  daily. With regard to your Xarelto, we have set up a way for you to get samples to last until you get insurance next month. Please make sure to keep your appointment.

## 2016-06-19 NOTE — Discharge Summary (Signed)
Physician Discharge Summary  ARGLE MICA VCB:[id number] DOB: Apr 27, 1971 DOA: 06/17/2016  PCP: No PCP Per Patient  Admit date: 06/17/2016 Discharge date: 06/19/2016  Admitted From: Home Disposition:  Home  Recommendations for Outpatient Follow-up:  1. Follow up with PCP tomorrow 2. Readdress access to anticoagulation  Discharge Condition: Stable CODE STATUS: Full code   Brief/Interim Summary:  HPI written by Dr. Lyda Perone on 06/18/2016  HPI: Cody [name] is a 45 y.o. male with medical history significant of accelerated HTN, DVT in June.  Patient has been off of meds due to inability to afford (especially the xarelto) but also he ran out of his HTN meds a week ago.  For the past 2-3 days he has had SOB.  He checked his BP today at home and the meter said 242/153.  He came in to the ED.  ED Course: BP initially 232/151.  Patient is given his home BP meds that he ran out of a week ago, and his BP comes down to 160/104.  Hospital course:  Hypertensive urgency Patient initially with blood pressures in the systolic range of 200 and diastolic range of 110s. Mild troponin elevation which trended down without EKG changes or chest pain. Blood pressure initially improved after restarting home antihypertensives. Blood pressures were still elevated, so amlodipine increased to 10mg  daily on discharge  DVT Present since June. Patient could not afford Xarelto as he does not currently have insurance. Case management was consulted and arranged for patient to receive a one month supply via samples. Outpatient follow-up was arranged before discharge.  Discharge Diagnoses:  Principal Problem:   Accelerated hypertension Active Problems:   DVT (deep venous thrombosis) (HCC)    Discharge Instructions     Medication List    TAKE these medications   amLODipine 10 MG tablet Commonly known as:  NORVASC Take 1 tablet (10 mg total) by mouth daily. What changed:  medication  strength  how much to take   carvedilol 12.5 MG tablet Commonly known as:  COREG Take 1 tablet (12.5 mg total) by mouth 2 (two) times daily with a meal.   furosemide 40 MG tablet Commonly known as:  LASIX Take 1 tablet (40 mg total) by mouth daily.   hydrALAZINE 50 MG tablet Commonly known as:  APRESOLINE Take 1 tablet (50 mg total) by mouth 3 (three) times daily.   potassium chloride SA 20 MEQ tablet Commonly known as:  K-DUR,KLOR-CON Take 1 tablet (20 mEq total) by mouth daily.   rivaroxaban 20 MG Tabs tablet Commonly known as:  XARELTO Take 1 tablet (20 mg total) by mouth daily with supper.      Follow-up Information    [facility] Follow up on 06/25/2016.   Why:  follow up on 06/25/16 at 9:30 if unable to make appointment please call to reschedule Contact information: [address] [place] [address]         No Known Allergies  Consultations:  None   Procedures/Studies: Dg Chest 2 View  Result Date: 06/17/2016 CLINICAL DATA:  Shortness of breath for 2-3 days EXAM: CHEST  2 VIEW COMPARISON:  02/20/2016 FINDINGS: Cardiac shadow remains enlarged. Increased central vascular congestion with mild interstitial edema is noted consistent with CHF. No acute bony abnormality is seen. No sizable effusion is noted. IMPRESSION: Mild CHF. Electronically Signed   By: Alcide Clever M.D.   On: 06/17/2016 21:34      Subjective: Patient reports no chest pain,  dyspnea or palpitations  Discharge Exam: Vitals:   06/19/16 0515 06/19/16 1106  BP: (!) 160/99 (!) 174/110  Pulse: 74 63  Resp: 18   Temp: 98 F (36.7 C)    Vitals:   06/18/16 1446 06/18/16 2017 06/19/16 0515 06/19/16 1106  BP: (!) 156/92 (!) 163/103 (!) 160/99 (!) 174/110  Pulse: 67 60 74 63  Resp: 16 18 18    Temp: 97.4 F (36.3 C) 97.7 F (36.5 C) 98 F (36.7 C)   TempSrc: Oral Oral Oral   SpO2: 100% 100% 100%   Weight: 117.9 kg (260 lb)  117.9 kg (259 lb 14.4 oz)    Height: 6' (1.829 m)       General: Pt is alert, awake, not in acute distress Cardiovascular: RRR, S1/S2 +, no rubs, no gallops Respiratory: CTA bilaterally, no wheezing, no rhonchi Abdominal: Soft, NT, ND, bowel sounds + Extremities: no edema, no cyanosis    The results of significant diagnostics from this hospitalization (including imaging, microbiology, ancillary and laboratory) are listed below for reference.     Microbiology: No results found for this or any previous visit (from the past 240 hour(s)).   Labs: BNP (last 3 results)  Recent Labs  02/20/16 1453  BNP 324.9*   Basic Metabolic Panel:  Recent Labs Lab 06/17/16 2107 06/19/16 0230  NA 141 140  K 3.5 3.4*  CL 107 103  CO2 25 29  GLUCOSE 133* 96  BUN 17 13  CREATININE 1.62* 1.31*  CALCIUM 9.1 9.0   Liver Function Tests: No results for input(s): AST, ALT, ALKPHOS, BILITOT, PROT, ALBUMIN in the last 168 hours. No results for input(s): LIPASE, AMYLASE in the last 168 hours. No results for input(s): AMMONIA in the last 168 hours. CBC:  Recent Labs Lab 06/17/16 2107  WBC 9.1  HGB 12.7*  HCT 39.2  MCV 80.8  PLT 309   Cardiac Enzymes: No results for input(s): CKTOTAL, CKMB, CKMBINDEX, TROPONINI in the last 168 hours. BNP: Invalid input(s): POCBNP CBG: No results for input(s): GLUCAP in the last 168 hours. D-Dimer No results for input(s): DDIMER in the last 72 hours. Hgb A1c No results for input(s): HGBA1C in the last 72 hours. Lipid Profile No results for input(s): CHOL, HDL, LDLCALC, TRIG, CHOLHDL, LDLDIRECT in the last 72 hours. Thyroid function studies No results for input(s): TSH, T4TOTAL, T3FREE, THYROIDAB in the last 72 hours.  Invalid input(s): FREET3 Anemia work up No results for input(s): VITAMINB12, FOLATE, FERRITIN, TIBC, IRON, RETICCTPCT in the last 72 hours. Urinalysis No results found for: COLORURINE, APPEARANCEUR, LABSPEC, PHURINE, GLUCOSEU, HGBUR, BILIRUBINUR, KETONESUR,  PROTEINUR, UROBILINOGEN, NITRITE, LEUKOCYTESUR Sepsis Labs Invalid input(s): PROCALCITONIN,  WBC,  LACTICIDVEN Microbiology No results found for this or any previous visit (from the past 240 hour(s)).   Time coordinating discharge: Over 30 minutes  SIGNED:   Jacquelin Hawkingalph Florencio Hollibaugh, MD [facility] 06/19/2016, 1:19 PM Pager [phone]  If 7PM-7AM, please contact night-coverage [email or web address] Password [email or web address]

## 2016-06-19 NOTE — Care Management Note (Signed)
Case Management Note Cody Pierini RN, BSN Unit 2W-Case Manager [phone]  Patient Details  Name: Cody [name] MRN: [id number] Date of Birth: May 11, 1971  Subjective/Objective:  Pt admitted with HTN urgency                  Action/Plan: PTA pt lived at home- independent- referral received for medication needs, Xarelto and PCP needs- spoke with pt at bedside- pt had been set up with f/[facility] appointment back [facility]- but pt states the moved to [place] and had to cancel appointment- he states that he used 30 day free card to get Xarelto and was able to afford med and had a PCP in [place], until he lost his job. He is now back here in [place] and has a new job but has another mo to wait before he will be covered with insurance again. Pt is eligible for MATCH - CM will assist pt with his BP meds through [facility] program- letter provided and program explained to pt with $3 copay per script- have spoken to Xarelto rep. Who states that samples for Xarelto can be left for pt to pick up tomorrow after 8:30 am at the [facility]- pt will be provided samples for Xarelto at [facility]-pt will have a months supply. -  f/u appointment has been made for Nov. 8 at 9:30- pt was also given the pt assistance application to have if needed.   Expected Discharge Date:      06/19/16            Expected Discharge Plan:  Home/Self Care  In-House Referral:     Discharge planning Services  CM Consult, Medication Assistance, [facility], Follow-up appt scheduled, MATCH Program  Post Acute Care Choice:    Choice offered to:     DME Arranged:    DME Agency:     HH Arranged:    HH Agency:     Status of Service:  Completed, signed off  If discussed at [employer], dates discussed:    Additional Comments:  Darrold Span, RN 06/19/2016, 12:03 PM

## 2016-06-19 NOTE — Progress Notes (Signed)
Patient discharged per MD order. Telemetry d/c, CCMD notified. IV removed. AVS explained including follow appointments and explained to pick up xarelto samples tomorrow, Case management explained as well. Patient left via self in car.  Minerva Ends

## 2016-06-25 ENCOUNTER — Ambulatory Visit: Payer: Self-pay | Admitting: Family Medicine

## 2016-10-30 ENCOUNTER — Encounter (HOSPITAL_COMMUNITY): Payer: Self-pay | Admitting: Emergency Medicine

## 2016-10-30 DIAGNOSIS — I509 Heart failure, unspecified: Secondary | ICD-10-CM | POA: Insufficient documentation

## 2016-10-30 DIAGNOSIS — N183 Chronic kidney disease, stage 3 (moderate): Secondary | ICD-10-CM | POA: Insufficient documentation

## 2016-10-30 DIAGNOSIS — Z79899 Other long term (current) drug therapy: Secondary | ICD-10-CM | POA: Insufficient documentation

## 2016-10-30 DIAGNOSIS — I130 Hypertensive heart and chronic kidney disease with heart failure and stage 1 through stage 4 chronic kidney disease, or unspecified chronic kidney disease: Secondary | ICD-10-CM | POA: Insufficient documentation

## 2016-10-30 DIAGNOSIS — I161 Hypertensive emergency: Secondary | ICD-10-CM | POA: Insufficient documentation

## 2016-10-30 DIAGNOSIS — R7989 Other specified abnormal findings of blood chemistry: Secondary | ICD-10-CM | POA: Insufficient documentation

## 2016-10-30 NOTE — ED Triage Notes (Signed)
Pt c/o 8/10 generalized body ache and persistent dry cough. Denies any fever or chills.

## 2016-10-30 NOTE — ED Notes (Signed)
Pt reports he has been out of lisinopril x 2 weeks

## 2016-10-31 ENCOUNTER — Emergency Department (HOSPITAL_COMMUNITY)
Admission: EM | Admit: 2016-10-31 | Discharge: 2016-10-31 | Discharge status: Against Medical Advice | Payer: Self-pay | Attending: Emergency Medicine | Admitting: Emergency Medicine

## 2016-10-31 ENCOUNTER — Emergency Department (HOSPITAL_COMMUNITY): Payer: Self-pay

## 2016-10-31 DIAGNOSIS — R7989 Other specified abnormal findings of blood chemistry: Secondary | ICD-10-CM

## 2016-10-31 DIAGNOSIS — R778 Other specified abnormalities of plasma proteins: Secondary | ICD-10-CM

## 2016-10-31 DIAGNOSIS — I161 Hypertensive emergency: Secondary | ICD-10-CM

## 2016-10-31 LAB — CBC WITH DIFFERENTIAL/PLATELET
BASOS ABS: 0 10*3/uL (ref 0.0–0.1)
BASOS PCT: 0 %
Eosinophils Absolute: 0.1 10*3/uL (ref 0.0–0.7)
Eosinophils Relative: 1 %
HCT: 43.5 % (ref 39.0–52.0)
HEMOGLOBIN: 13.9 g/dL (ref 13.0–17.0)
LYMPHS PCT: 12 %
Lymphs Abs: 1.1 10*3/uL (ref 0.7–4.0)
MCH: 25.9 pg — ABNORMAL LOW (ref 26.0–34.0)
MCHC: 32 g/dL (ref 30.0–36.0)
MCV: 81.2 fL (ref 78.0–100.0)
MONO ABS: 0.8 10*3/uL (ref 0.1–1.0)
Monocytes Relative: 9 %
NEUTROS ABS: 7.2 10*3/uL (ref 1.7–7.7)
NEUTROS PCT: 78 %
Platelets: 285 10*3/uL (ref 150–400)
RBC: 5.36 MIL/uL (ref 4.22–5.81)
RDW: 14.4 % (ref 11.5–15.5)
WBC: 9.1 10*3/uL (ref 4.0–10.5)

## 2016-10-31 LAB — BASIC METABOLIC PANEL
ANION GAP: 13 (ref 5–15)
BUN: 19 mg/dL (ref 6–20)
CALCIUM: 9 mg/dL (ref 8.9–10.3)
CHLORIDE: 101 mmol/L (ref 101–111)
CO2: 25 mmol/L (ref 22–32)
Creatinine, Ser: 1.57 mg/dL — ABNORMAL HIGH (ref 0.61–1.24)
GFR calc non Af Amer: 52 mL/min — ABNORMAL LOW (ref >60)
GFR, EST AFRICAN AMERICAN: 60 mL/min — AB (ref >60)
Glucose, Bld: 143 mg/dL — ABNORMAL HIGH (ref 65–99)
POTASSIUM: 3.3 mmol/L — AB (ref 3.5–5.1)
Sodium: 139 mmol/L (ref 135–145)

## 2016-10-31 LAB — BRAIN NATRIURETIC PEPTIDE: B Natriuretic Peptide: 1013.9 pg/mL — ABNORMAL HIGH (ref 0.0–100.0)

## 2016-10-31 LAB — TROPONIN I
TROPONIN I: 0.14 ng/mL — AB (ref <0.03)
Troponin I: 0.1 ng/mL (ref <0.03)

## 2016-10-31 MED ORDER — LABETALOL HCL 5 MG/ML IV SOLN
80.0000 mg | INTRAVENOUS | Status: DC | PRN
  Administered 2016-10-31: 80 mg via INTRAVENOUS
  Filled 2016-10-31: qty 16

## 2016-10-31 MED ORDER — HYDRALAZINE HCL 50 MG PO TABS: 50.0000 mg | ORAL_TABLET | Freq: Three times a day (TID) | ORAL | 0 refills | Status: DC

## 2016-10-31 MED ORDER — FUROSEMIDE 40 MG PO TABS: 40.0000 mg | ORAL_TABLET | Freq: Every day | ORAL | 0 refills | Status: DC

## 2016-10-31 MED ORDER — LABETALOL HCL 5 MG/ML IV SOLN
20.0000 mg | Freq: Once | INTRAVENOUS | Status: AC
  Administered 2016-10-31: 20 mg via INTRAVENOUS
  Filled 2016-10-31: qty 4

## 2016-10-31 MED ORDER — CARVEDILOL 12.5 MG PO TABS
12.5000 mg | ORAL_TABLET | Freq: Two times a day (BID) | ORAL | 0 refills | Status: DC
Start: 2016-10-31 — End: 2017-01-27

## 2016-10-31 MED ORDER — LABETALOL HCL 5 MG/ML IV SOLN
40.0000 mg | INTRAVENOUS | Status: DC | PRN
  Administered 2016-10-31: 40 mg via INTRAVENOUS
  Filled 2016-10-31: qty 8

## 2016-10-31 MED ORDER — FUROSEMIDE 20 MG PO TABS
40.0000 mg | ORAL_TABLET | Freq: Every day | ORAL | Status: DC
  Administered 2016-10-31: 40 mg via ORAL
  Filled 2016-10-31: qty 2

## 2016-10-31 MED ORDER — HYDRALAZINE HCL 50 MG PO TABS
50.0000 mg | ORAL_TABLET | Freq: Three times a day (TID) | ORAL | Status: DC
  Administered 2016-10-31: 50 mg via ORAL
  Filled 2016-10-31: qty 1

## 2016-10-31 MED ORDER — AMLODIPINE BESYLATE 10 MG PO TABS: 10.0000 mg | ORAL_TABLET | Freq: Every day | ORAL | 0 refills | Status: DC

## 2016-10-31 MED ORDER — CARVEDILOL 12.5 MG PO TABS
12.5000 mg | ORAL_TABLET | Freq: Two times a day (BID) | ORAL | Status: DC
  Administered 2016-10-31: 12.5 mg via ORAL
  Filled 2016-10-31: qty 1

## 2016-10-31 MED ORDER — LISINOPRIL 20 MG PO TABS
40.0000 mg | ORAL_TABLET | Freq: Every day | ORAL | Status: DC
  Administered 2016-10-31: 40 mg via ORAL
  Filled 2016-10-31: qty 2

## 2016-10-31 NOTE — ED Notes (Signed)
Lab called high troponin  Back to treatment room

## 2016-10-31 NOTE — ED Provider Notes (Signed)
[facility]-EMERG[facility]Y DEPT Provider Note   CSN: [id number] Arrival date & time: 10/30/16  2336   By signing my name below, I, Clovis Pu, attest that this documentation has been prepared under the direction and in the presence of Derwood Kaplan, MD  Electronically Signed: Clovis Pu, ED Scribe. 10/31/16. 3:42 AM.   History   Chief Complaint Chief Complaint  Patient presents with  . Cough  . Generalized Body Aches   The history is provided by the patient. No language interpreter was used.   HPI Comments:  Cody [name] is a 46 y.o. male, with a PMHx of CKD, DVT, HTN and CHF, who presents to the Emergency Department complaining of persistent generalized body aches onset 2 days. Pt also reports a dry cough, malaise, subjective fever and diaphoresis. No alleviating factors noted. Pt denies chest pain, SOB, numbness/tingling, any visual disturbances, a hx of MI or any other associated symptoms. Pt also reports he has run out of his blood pressure medication 1 month ago and states his blood pressure has been running high due to this. Pt is not followed by a PCP. No other complaints noted.   Past Medical History:  Diagnosis Date  . CKD (chronic kidney disease), stage III    [name] 02/20/2016  . DVT (deep venous thrombosis) (HCC) 01/2016   a. RLE - Dx 01/2016 in [name] - Took Xarelto for 30 days - out x 2 wks.  [name]   . Hypertension   . Hypertensive heart disease with CHF (congestive heart failure) [facility])     Patient Active Problem List   Diagnosis Date Noted  . Hypertensive emergency 06/18/2016  . H/O noncompliance with medical treatment, presenting hazards to health   . DVT (deep venous thrombosis) (HCC) 02/20/2016  . CKD (chronic kidney disease) stage 3, GFR 30-59 ml/min 02/20/2016  . Accelerated hypertension 02/20/2016    Past Surgical History:  Procedure Laterality Date  . NO PAST SURGERIES      Home Medications    Prior to Admission medications   Medication Sig Start  Date End Date Taking? Authorizing Provider  lisinopril (PRINIVIL,ZESTRIL) 40 MG tablet Take 40 mg by mouth daily.   Yes Historical Provider, MD  potassium chloride SA (K-DUR,KLOR-CON) 20 MEQ tablet Take 1 tablet (20 mEq total) by mouth daily. 02/22/16  Yes Lonia Blood, MD  rivaroxaban (XARELTO) 20 MG TABS tablet Take 1 tablet (20 mg total) by mouth daily with supper. 06/19/16  Yes Narda Bonds, MD  amLODipine (NORVASC) 10 MG tablet Take 1 tablet (10 mg total) by mouth daily. 10/31/16   Derwood Kaplan, MD  carvedilol (COREG) 12.5 MG tablet Take 1 tablet (12.5 mg total) by mouth 2 (two) times daily with a meal. 10/31/16   Derwood Kaplan, MD  furosemide (LASIX) 40 MG tablet Take 1 tablet (40 mg total) by mouth daily. 10/31/16   Derwood Kaplan, MD  hydrALAZINE (APRESOLINE) 50 MG tablet Take 1 tablet (50 mg total) by mouth 3 (three) times daily. 10/31/16   Derwood Kaplan, MD    Family History Family History  Problem Relation Age of Onset  . Asthma Mother     alive and well  . Hypertension Father     alive  . Lung cancer Father     Social History Social History  Substance Use Topics  . Smoking status: Never Smoker  . Smokeless tobacco: Never Used  . Alcohol use No     Allergies   Patient has no known allergies.  Review of Systems Review of Systems 10 systems reviewed and all are negative for acute change except as noted in the HPI.  Physical Exam Updated Vital Signs BP (!) 164/101   Pulse 66   Temp 99.1 F (37.3 C) (Oral)   Resp (!) 23   Ht 6' (1.829 m)   Wt 259 lb (117.5 kg)   SpO2 98%   BMI 35.13 kg/m   Physical Exam  Constitutional: He is oriented to person, place, and time. He appears well-developed and well-nourished.  HENT:  Head: Normocephalic and atraumatic.  Eyes: EOM are normal.  Neck: Normal range of motion.  Cardiovascular: Regular rhythm and normal heart sounds.  Tachycardia present.   Pulmonary/Chest: Effort normal. No respiratory distress. He has  wheezes.  Right sided wheeze. No crackles.   Abdominal: Soft. He exhibits no distension. There is no tenderness.  Musculoskeletal: Normal range of motion.  Neurological: He is alert and oriented to person, place, and time.  Skin: Skin is warm and dry.  Psychiatric: He has a normal mood and affect. Judgment normal.  Nursing note and vitals reviewed.   ED Treatments / Results  DIAGNOSTIC STUDIES:  Oxygen Saturation is 100% on RA, normal by my interpretation.    COORDINATION OF CARE:  3:41 AM Discussed treatment plan with pt at bedside and pt agreed to plan.  Labs (all labs ordered are listed, but only abnormal results are displayed) Labs Reviewed  CBC WITH DIFFERENTIAL/PLATELET - Abnormal; Notable for the following:       Result Value   MCH 25.9 (*)    All other components within normal limits  BASIC METABOLIC PANEL - Abnormal; Notable for the following:    Potassium 3.3 (*)    Glucose, Bld 143 (*)    Creatinine, Ser 1.57 (*)    GFR calc non Af Amer 52 (*)    GFR calc Af Amer 60 (*)    All other components within normal limits  TROPONIN I - Abnormal; Notable for the following:    Troponin I 0.10 (*)    All other components within normal limits  TROPONIN I - Abnormal; Notable for the following:    Troponin I 0.14 (*)    All other components within normal limits  BRAIN NATRIURETIC PEPTIDE - Abnormal; Notable for the following:    B Natriuretic Peptide 1,013.9 (*)    All other components within normal limits    EKG  EKG Interpretation  Date/Time:  Thursday October 30 2016 23:57:34 EDT Ventricular Rate:  105 PR Interval:  182 QRS Duration: 118 QT Interval:  352 QTC Calculation: 465 R Axis:   60 Text Interpretation:  Sinus tachycardia Biatrial enlargement Left ventricular hypertrophy with QRS widening and repolarization abnormality Abnormal ECG No significant change since last tracing Confirmed by Rhunette Croft, MD, Janey Genta [phone]) on 10/31/2016 3:30:47 AM        Radiology Dg Chest 2 View  Result Date: 10/31/2016 CLINICAL DATA:  Persistent generalized body aches for 2 days. Dry cough, legs, fever, diaphoresis. EXAM: CHEST  2 VIEW COMPARISON:  06/17/2016 FINDINGS: Cardiac enlargement. No vascular congestion or edema. No focal consolidation. No blunting of costophrenic angles. No pneumothorax. Mediastinal contours appear intact. IMPRESSION: Cardiac enlargement.  No evidence of active pulmonary disease. Electronically Signed   By: Burman Nieves M.D.   On: 10/31/2016 05:13    Procedures Procedures (including critical care time)  CRITICAL CARE Performed by: Derwood Kaplan   Total critical care time: 40 minutes  Critical care time was  exclusive of separately billable procedures and treating other patients.  Critical care was necessary to treat or prevent imminent or life-threatening deterioration.  Critical care was time spent personally by me on the following activities: development of treatment plan with patient and/or surrogate as well as nursing, discussions with consultants, evaluation of patient's response to treatment, examination of patient, obtaining history from patient or surrogate, ordering and performing treatments and interventions, ordering and review of laboratory studies, ordering and review of radiographic studies, pulse oximetry and re-evaluation of patient's condition.    Medications Ordered in ED Medications  labetalol (NORMODYNE,TRANDATE) injection 40 mg (40 mg Intravenous Given 10/31/16 0433)  labetalol (NORMODYNE,TRANDATE) injection 80 mg (80 mg Intravenous Given 10/31/16 0512)  carvedilol (COREG) tablet 12.5 mg (12.5 mg Oral Given 10/31/16 0754)  furosemide (LASIX) tablet 40 mg (40 mg Oral Given 10/31/16 0756)  hydrALAZINE (APRESOLINE) tablet 50 mg (50 mg Oral Given 10/31/16 0757)  lisinopril (PRINIVIL,ZESTRIL) tablet 40 mg (40 mg Oral Given 10/31/16 0757)  labetalol (NORMODYNE,TRANDATE) injection 20 mg (20 mg Intravenous  Given 10/31/16 0357)     Initial Impression / Assessment and Plan / ED Course  I have reviewed the triage vital signs and the nursing notes.  Pertinent labs & imaging results that were available during my care of the patient were reviewed by me and considered in my medical decision making (see chart for details).     Pt comes in with some flu like symptoms. He is also c/o elevated BP. Pt has not been taking his meds as prescribed as he has run out of them and has no doctor.  Pt has a MAP of 160. On hx, there is no symptoms of end organ damage, and the exam is also neg for any signs of end organ damage. However, his trop is elevated.  We wanted to admit patient - but he wanted to go home. Pt agreed to repeat trop, CXR and BNP. He also agreed to labetalol IV to get MAP to goal of 120 mmhg quickly.  Pt's troponin went up. We discussed admission again - but pt declined. All prescriptions provided. Strict ER return precautions have been discussed, and patient is agreeing with the plan and is comfortable with the workup done and the recommendations from the ER.  Patient wants to leave against medical advice. Patient understands that his actions will lead to inadequate medical workup or treatment, and that he is at risk of complications of suboptima treatment, which includes morbidity and mortality.  Alternative options discussed  - admit as obs, no arterial line / iv drips. Opportunity to change mind given with repeat labs and reassessments. Discussion witnessed by scribe Patient is demonstrating good capacity to make decision. Patient understands that he/she needs to return to the ER immediately if his/her symptoms get worse.     I personally performed the services described in this documentation, which was scribed in my presence. The recorded information has been reviewed and is accurate.   Final Clinical Impressions(s) / ED Diagnoses   Final diagnoses:  Hypertensive emergency   Elevated troponin    New Prescriptions Discharge Medication List as of 10/31/2016  7:51 AM    START taking these medications   Details  amLODipine (NORVASC) 10 MG tablet Take 1 tablet (10 mg total) by mouth daily., Starting Fri 10/31/2016, Print         Derwood Kaplan, MD 10/31/16 [phone]

## 2016-10-31 NOTE — Discharge Instructions (Signed)
Start taking the meds as prescribed. SEE A PRIMARY CARE DOCTOR AS SOON AS POSSIBLE.

## 2016-10-31 NOTE — ED Notes (Signed)
Spoke with patient about High BP.  Patient stated that he has not taken ANY BP medications in A MONTH

## 2016-12-11 DIAGNOSIS — G4733 Obstructive sleep apnea (adult) (pediatric): Secondary | ICD-10-CM | POA: Insufficient documentation

## 2017-01-22 ENCOUNTER — Encounter (HOSPITAL_COMMUNITY): Payer: Self-pay | Admitting: Vascular Surgery

## 2017-01-22 ENCOUNTER — Emergency Department (HOSPITAL_COMMUNITY)
Admission: EM | Admit: 2017-01-22 | Discharge: 2017-01-22 | Disposition: A | Discharge status: Home / Self Care | Payer: Self-pay | Attending: Physician Assistant | Admitting: Physician Assistant

## 2017-01-22 ENCOUNTER — Emergency Department (HOSPITAL_BASED_OUTPATIENT_CLINIC_OR_DEPARTMENT_OTHER)
Admit: 2017-01-22 | Discharge: 2017-01-22 | Disposition: A | Discharge status: Home / Self Care | Payer: Self-pay | Attending: Emergency Medicine | Admitting: Emergency Medicine

## 2017-01-22 DIAGNOSIS — M7989 Other specified soft tissue disorders: Secondary | ICD-10-CM

## 2017-01-22 DIAGNOSIS — R2242 Localized swelling, mass and lump, left lower limb: Secondary | ICD-10-CM | POA: Insufficient documentation

## 2017-01-22 DIAGNOSIS — Z7901 Long term (current) use of anticoagulants: Secondary | ICD-10-CM | POA: Insufficient documentation

## 2017-01-22 DIAGNOSIS — R600 Localized edema: Secondary | ICD-10-CM

## 2017-01-22 DIAGNOSIS — M79609 Pain in unspecified limb: Secondary | ICD-10-CM

## 2017-01-22 DIAGNOSIS — I130 Hypertensive heart and chronic kidney disease with heart failure and stage 1 through stage 4 chronic kidney disease, or unspecified chronic kidney disease: Secondary | ICD-10-CM | POA: Insufficient documentation

## 2017-01-22 DIAGNOSIS — Z79899 Other long term (current) drug therapy: Secondary | ICD-10-CM | POA: Insufficient documentation

## 2017-01-22 DIAGNOSIS — I509 Heart failure, unspecified: Secondary | ICD-10-CM | POA: Insufficient documentation

## 2017-01-22 DIAGNOSIS — N183 Chronic kidney disease, stage 3 (moderate): Secondary | ICD-10-CM | POA: Insufficient documentation

## 2017-01-22 MED ORDER — APIXABAN 5 MG PO TABS
10.0000 mg | ORAL_TABLET | Freq: Two times a day (BID) | ORAL | Status: DC
  Administered 2017-01-22: 10 mg via ORAL
  Filled 2017-01-22: qty 2

## 2017-01-22 MED ORDER — APIXABAN 5 MG PO TABS
10.0000 mg | ORAL_TABLET | Freq: Two times a day (BID) | ORAL | Status: DC
  Filled 2017-01-22: qty 2

## 2017-01-22 MED ORDER — HYDROCHLOROTHIAZIDE 25 MG PO TABS: 25.0000 mg | ORAL_TABLET | Freq: Every day | ORAL | 0 refills | Status: DC

## 2017-01-22 MED ORDER — APIXABAN 5 MG PO TABS: 5.0000 mg | ORAL_TABLET | Freq: Two times a day (BID) | ORAL | Status: DC

## 2017-01-22 MED ORDER — APIXABAN 5 MG PO TABS: ORAL_TABLET | ORAL | 0 refills | Status: DC

## 2017-01-22 MED ORDER — APIXABAN (ELIQUIS) EDUCATION KIT FOR DVT/PE PATIENTS
PACK | Freq: Once | Status: AC
  Administered 2017-01-22: 17:00:00
  Filled 2017-01-22: qty 1

## 2017-01-22 MED ORDER — OXYCODONE-ACETAMINOPHEN 5-325 MG PO TABS
1.0000 | ORAL_TABLET | Freq: Once | ORAL | Status: AC
  Administered 2017-01-22: 1 via ORAL
  Filled 2017-01-22: qty 1

## 2017-01-22 NOTE — ED Notes (Signed)
Called pts name to obtain vitals, pt did not answer x3.

## 2017-01-22 NOTE — Progress Notes (Signed)
VASCULAR LAB PRELIMINARY  PRELIMINARY  PRELIMINARY  PRELIMINARY  Left lower extremity venous duplex completed.    Preliminary report:  There is no obvious evidence of DVT or SVT noted in the left lower extremity.   Called results to [facility], [facility], RVT 01/22/2017, 3:01 PM

## 2017-01-22 NOTE — Care Management (Signed)
Ed CM consulted by Dr. Corlis Leak concerning medication assistance and PCP follow up. Patient has hx of DVT and cannot afford the medications, patient states he was given a two month supply of Xarelto at [facility], Dr. Corlis Leak plans to start patient on Eliquis from the ED, patient states he has not been given a 30 day free Eliquis voucher. CM also discussed follow up and establishing care [facility], patient is agreeable with care transition planning. CM provided patient with the Eliquis voucher and instructions on how to redeem. Patient also scheduled for a follow up appointment at the [facility] Tuesday 6/12 at 10:30 am. Patient verbalized understanding teach back done. No further ED CM needs identified.  Updated Dr. Corlis Leak she is agreeable with plan.

## 2017-01-22 NOTE — ED Triage Notes (Signed)
Pt reports to the ED for eval of left leg and foot swelling. He denies any known injury to the extremity. States it has been swollen x several days. He has tried ice and Tylenol without relief in his symptoms. Swelling is unilateral. Denies any recent long trips, bed rest, or surgeries. Also denies any CP, SOB, or CHF.

## 2017-01-22 NOTE — ED Notes (Signed)
Patient provided with food and drink, okay'd by Md, given by Maralyn Sago, [place]

## 2017-01-22 NOTE — ED Provider Notes (Signed)
[facility]-EM[facility]PT Provider Note   CSN: [id number] Arrival date & time: 01/22/17  1317     History   Chief Complaint Chief Complaint  Patient presents with  . Leg Swelling    HPI Cody [name] is a 46 y.o. male.  HPI   Patient is a 46 year old male presenting with swelling in his legs. Left worse than right. Patient has history of DVT and has been off his blood thinners, he can't afford them. This was diagnosed in [facility] and he was started on Xarelto.  Patient is also ran out of his blood pressure medication, HCTZt. He reports he has no insurance and has no primary care physician.  Patient reports pain to left leg. Swelling. He's had swelling since the DVT but he feels is getting worse and with the heat.  Past Medical History:  Diagnosis Date  . CKD (chronic kidney disease), stage III    [name] 02/20/2016  . DVT (deep venous thrombosis) (HCC) 01/2016   a. RLE - Dx 01/2016 in [name] - Took Xarelto for 30 days - out x 2 wks.  [name]   . Hypertension   . Hypertensive heart disease with CHF (congestive heart failure) [facility])     Patient Active Problem List   Diagnosis Date Noted  . Hypertensive emergency 06/18/2016  . H/O noncompliance with medical treatment, presenting hazards to health   . DVT (deep venous thrombosis) (HCC) 02/20/2016  . CKD (chronic kidney disease) stage 3, GFR 30-59 ml/min 02/20/2016  . Accelerated hypertension 02/20/2016    Past Surgical History:  Procedure Laterality Date  . NO PAST SURGERIES         Home Medications    Prior to Admission medications   Medication Sig Start Date End Date Taking? Authorizing Provider  amLODipine (NORVASC) 10 MG tablet Take 1 tablet (10 mg total) by mouth daily. 10/31/16   Derwood Kaplan, MD  carvedilol (COREG) 12.5 MG tablet Take 1 tablet (12.5 mg total) by mouth 2 (two) times daily with a meal. 10/31/16   Derwood Kaplan, MD  furosemide (LASIX) 40 MG tablet Take 1 tablet (40 mg total) by mouth  daily. 10/31/16   Derwood Kaplan, MD  hydrALAZINE (APRESOLINE) 50 MG tablet Take 1 tablet (50 mg total) by mouth 3 (three) times daily. 10/31/16   Derwood Kaplan, MD  lisinopril (PRINIVIL,ZESTRIL) 40 MG tablet Take 40 mg by mouth daily.    [provider]  potassium chloride SA (K-DUR,KLOR-CON) 20 MEQ tablet Take 1 tablet (20 mEq total) by mouth daily. 02/22/16   Lonia Blood, MD  rivaroxaban (XARELTO) 20 MG TABS tablet Take 1 tablet (20 mg total) by mouth daily with supper. 06/19/16   Narda Bonds, MD    Family History Family History  Problem Relation Age of Onset  . Asthma Mother        alive and well  . Hypertension Father        alive  . Lung cancer Father     Social History Social History  Substance Use Topics  . Smoking status: Never Smoker  . Smokeless tobacco: Never Used  . Alcohol use No     Allergies   Patient has no known allergies.   Review of Systems Review of Systems  Constitutional: Negative for activity change.  Respiratory: Negative for shortness of breath.   Cardiovascular: Negative for chest pain.  Gastrointestinal: Negative for abdominal pain.     Physical Exam Updated Vital Signs BP (!) 157/132 (BP  Location: Left Arm)   Pulse 76   Temp 98.3 F (36.8 C) (Oral)   Resp 16   Ht 6' (1.829 m)   Wt 117.9 kg (260 lb)   SpO2 94%   BMI 35.26 kg/m   Physical Exam  Constitutional: He is oriented to person, place, and time. He appears well-nourished.  HENT:  Head: Normocephalic.  Eyes: Conjunctivae are normal. Right eye exhibits no discharge. Left eye exhibits no discharge.  Cardiovascular: Normal rate and regular rhythm.   Pulmonary/Chest: Effort normal and breath sounds normal. No respiratory distress.  Musculoskeletal:  Swelling left leg worse than right leg. Good pulses. Normal cap refill.  Neurological: He is oriented to person, place, and time.  Skin: Skin is warm and dry. He is not diaphoretic.  Psychiatric: He has a normal  mood and affect. His behavior is normal.     ED Treatments / Results  Labs (all labs ordered are listed, but only abnormal results are displayed) Labs Reviewed - No data to display  EKG  EKG Interpretation None       Radiology No results found.  Procedures Procedures (including critical care time)  Medications Ordered in ED Medications - No data to display   Initial Impression / Assessment and Plan / ED Course  I have reviewed the triage vital signs and the nursing notes.  Pertinent labs & imaging results that were available during my care of the patient were reviewed by me and considered in my medical decision making (see chart for details).     Very well-appearing 46 year old male presenting with left leg swelling worse than right. Patient has history of DVT that leg. Ultrasound showed no DVT today. Discuss with case management who will help him get free Eloquis. We will also give him follow-up with her primary care physician. Patient says he has a lot of pain in that leg when walking. We recommended that he elevate it, take acetaminophen, use an Ace bandage as needed for swelling.  Suspect that this is sequela from his previous DVT. Patient has no shortness of breath. Do not suspect arterial thrombus given normal color and warmth.  Final Clinical Impressions(s) / ED Diagnoses   Final diagnoses:  None    New Prescriptions New Prescriptions   No medications on file     Abelino Derrick, MD 01/22/17 [phone]

## 2017-01-22 NOTE — Discharge Instructions (Signed)
We have restarted you on our request, a blood thinner. You need to take this to avoid having further clots. Please follow-up with her primary care physician. We have given you a prescription for blood pressure medicine as well. Is very important that he started follow-up with her primary care physician.

## 2017-01-22 NOTE — ED Notes (Signed)
Vascular tech advised this RN that patients venous study is negative for DVT

## 2017-01-22 NOTE — ED Triage Notes (Signed)
Pt reports hx of DVT. He is supposed to be on blood thinners but cannot afford them.

## 2017-01-22 NOTE — Progress Notes (Signed)
ANTICOAGULATION CONSULT NOTE - Initial Consult  Pharmacy Consult for apixaban  Indication: DVT (history)  No Known Allergies  Patient Measurements: Height: 6' (182.9 cm) Weight: 260 lb (117.9 kg) IBW/kg (Calculated) : 77.6  Vital Signs: Temp: 98.3 F (36.8 C) (06/07 1409) Temp Source: Oral (06/07 1409) BP: 157/132 (06/07 1409) Pulse Rate: 76 (06/07 1409)  Labs: No results for input(s): HGB, HCT, PLT, APTT, LABPROT, INR, HEPARINUNFRC, HEPRLOWMOCWT, CREATININE, CKTOTAL, CKMB, TROPONINI in the last 72 hours.  CrCl cannot be calculated (Patient's most recent lab result is older than the maximum 21 days allowed.).   Medical History: Past Medical History:  Diagnosis Date  . CKD (chronic kidney disease), stage III    [name] 02/20/2016  . DVT (deep venous thrombosis) (HCC) 01/2016   a. RLE - Dx 01/2016 in [name] - Took Xarelto for 30 days - out x 2 wks.  [name]   . Hypertension   . Hypertensive heart disease with CHF (congestive heart failure) (HCC)    Assessment: 46 yo male admitted with leg swelling. Patient has a history of DVT, but has not been taking his Xarelto as he cannot afford it. Pharmacy consulted to start eliquis. No labs per MD. Provider requesting first dose and education for discharge.    Plan:  Apixaban 10mg  PO x1 prior to discharge Patient to take Apixaban 10mg  BID x7 days, followed by 5mg  BID  Education provided  Monitor for s/s bleeding   York Cerise, PharmD Pharmacy Resident  Pager [phone] 01/22/17 4:36 PM

## 2017-01-27 ENCOUNTER — Encounter: Payer: Self-pay | Admitting: Family Medicine

## 2017-01-27 ENCOUNTER — Ambulatory Visit (INDEPENDENT_AMBULATORY_CARE_PROVIDER_SITE_OTHER): Payer: Self-pay | Admitting: Family Medicine

## 2017-01-27 VITALS — BP 190/102 | HR 80 | Temp 97.9°F | Resp 16 | Ht 72.0 in | Wt 270.0 lb

## 2017-01-27 DIAGNOSIS — I509 Heart failure, unspecified: Secondary | ICD-10-CM

## 2017-01-27 DIAGNOSIS — R600 Localized edema: Secondary | ICD-10-CM

## 2017-01-27 DIAGNOSIS — I10 Essential (primary) hypertension: Secondary | ICD-10-CM

## 2017-01-27 LAB — CBC WITH DIFFERENTIAL/PLATELET
BASOS PCT: 0 %
Basophils Absolute: 0 cells/uL (ref 0–200)
EOS ABS: 365 {cells}/uL (ref 15–500)
Eosinophils Relative: 5 %
HCT: 49.7 % (ref 38.5–50.0)
Hemoglobin: 16.9 g/dL (ref 13.2–17.1)
LYMPHS PCT: 22 %
Lymphs Abs: 1606 cells/uL (ref 850–3900)
MCH: 26.5 pg — ABNORMAL LOW (ref 27.0–33.0)
MCHC: 34 g/dL (ref 32.0–36.0)
MCV: 78 fL — AB (ref 80.0–100.0)
MONOS PCT: 7 %
MPV: 8.7 fL (ref 7.5–12.5)
Monocytes Absolute: 511 cells/uL (ref 200–950)
Neutro Abs: 4818 cells/uL (ref 1500–7800)
Neutrophils Relative %: 66 %
Platelets: 295 10*3/uL (ref 140–400)
RBC: 6.37 MIL/uL — ABNORMAL HIGH (ref 4.20–5.80)
RDW: 14.5 % (ref 11.0–15.0)
WBC: 7.3 10*3/uL (ref 3.8–10.8)

## 2017-01-27 LAB — COMPLETE METABOLIC PANEL WITH GFR
ALT: 14 U/L (ref 9–46)
AST: 23 U/L (ref 10–40)
Albumin: 4 g/dL (ref 3.6–5.1)
Alkaline Phosphatase: 76 U/L (ref 40–115)
BUN: 19 mg/dL (ref 7–25)
CHLORIDE: 96 mmol/L — AB (ref 98–110)
CO2: 27 mmol/L (ref 20–31)
CREATININE: 1.53 mg/dL — AB (ref 0.60–1.35)
Calcium: 9.3 mg/dL (ref 8.6–10.3)
GFR, EST AFRICAN AMERICAN: 63 mL/min (ref >=60)
GFR, Est Non African American: 54 mL/min — ABNORMAL LOW (ref >=60)
Glucose, Bld: 81 mg/dL (ref 65–99)
POTASSIUM: 3.4 mmol/L — AB (ref 3.5–5.3)
Sodium: 137 mmol/L (ref 135–146)
Total Bilirubin: 1 mg/dL (ref 0.2–1.2)
Total Protein: 7 g/dL (ref 6.1–8.1)

## 2017-01-27 MED ORDER — FUROSEMIDE 40 MG PO TABS: 40.0000 mg | ORAL_TABLET | Freq: Every day | ORAL | 0 refills | Status: DC

## 2017-01-27 MED ORDER — CARVEDILOL 12.5 MG PO TABS: 12.5000 mg | ORAL_TABLET | Freq: Two times a day (BID) | ORAL | 1 refills | Status: DC

## 2017-01-27 MED ORDER — HYDRALAZINE HCL 50 MG PO TABS: 50.0000 mg | ORAL_TABLET | Freq: Three times a day (TID) | ORAL | 1 refills | Status: DC

## 2017-01-27 MED ORDER — CLONIDINE HCL 0.1 MG PO TABS
0.2000 mg | ORAL_TABLET | Freq: Once | ORAL | Status: AC
  Administered 2017-01-27: 0.2 mg via ORAL

## 2017-01-27 MED ORDER — POTASSIUM CHLORIDE CRYS ER 20 MEQ PO TBCR: 20.0000 meq | EXTENDED_RELEASE_TABLET | ORAL | 0 refills | Status: DC

## 2017-01-27 MED ORDER — AMLODIPINE BESYLATE 10 MG PO TABS: 10.0000 mg | ORAL_TABLET | Freq: Every day | ORAL | 0 refills | Status: DC

## 2017-01-27 MED ORDER — APIXABAN 5 MG PO TABS: ORAL_TABLET | ORAL | 0 refills | Status: DC

## 2017-01-27 MED ORDER — LISINOPRIL 40 MG PO TABS: 40.0000 mg | ORAL_TABLET | Freq: Every day | ORAL | 1 refills | Status: DC

## 2017-01-27 MED ORDER — CARVEDILOL 12.5 MG PO TABS
12.5000 mg | ORAL_TABLET | Freq: Two times a day (BID) | ORAL | 1 refills | Status: DC
Start: 2017-01-27 — End: 2017-01-27

## 2017-01-27 MED FILL — AMLODIPINE BESYLATE 10 MG T: 10 | 30 days supply | Qty: 30 | Fill #0

## 2017-01-27 MED FILL — LISINOPRIL 40 MG TABLET: 40 | 30 days supply | Qty: 30 | Fill #0

## 2017-01-27 MED FILL — hydrALAZINE HCL 50 MG TABS: 50 | 30 days supply | Qty: 90 | Fill #0

## 2017-01-27 MED FILL — POTASSIUM CL ER 20 MEQ TAB: 20 | 30 days supply | Qty: 30 | Fill #0

## 2017-01-27 MED FILL — !ELIQUIS 5MG TABLET: 5 | 23 days supply | Qty: 60 | Fill #0

## 2017-01-27 MED FILL — ?CARVEDILOL 12.5 MG TABLET: 12.5 | 30 days supply | Qty: 60 | Fill #0

## 2017-01-27 MED FILL — ?FUROSEMIDE 40 MG TABLET: 40 | 30 days supply | Qty: 30 | Fill #0

## 2017-01-27 NOTE — Patient Instructions (Signed)
-Please take all medication as prescribed. -I am referring you to cardiology. -Increase physical activity as tolerated for a recommended 150 minutes per day.   Hypertension Hypertension is another name for high blood pressure. High blood pressure forces your heart to work harder to pump blood. This can cause problems over time. There are two numbers in a blood pressure reading. There is a top number (systolic) over a bottom number (diastolic). It is best to have a blood pressure below 120/80. Healthy choices can help lower your blood pressure. You may need medicine to help lower your blood pressure if:  Your blood pressure cannot be lowered with healthy choices.  Your blood pressure is higher than 130/80.  Follow these instructions at home: Eating and drinking  If directed, follow the DASH eating plan. This diet includes: ? Filling half of your plate at each meal with fruits and vegetables. ? Filling one quarter of your plate at each meal with whole grains. Whole grains include whole wheat pasta, brown rice, and whole grain bread. ? Eating or drinking low-fat dairy products, such as skim milk or low-fat yogurt. ? Filling one quarter of your plate at each meal with low-fat (lean) proteins. Low-fat proteins include fish, skinless chicken, eggs, beans, and tofu. ? Avoiding fatty meat, cured and processed meat, or chicken with skin. ? Avoiding premade or processed food.  Eat less than 1,500 mg of salt (sodium) a day.  Limit alcohol use to no more than 1 drink a day for nonpregnant women and 2 drinks a day for men. One drink equals 12 oz of beer, 5 oz of wine, or 1 oz of hard liquor. Lifestyle  Work with your doctor to stay at a healthy weight or to lose weight. Ask your doctor what the best weight is for you.  Get at least 30 minutes of exercise that causes your heart to beat faster (aerobic exercise) most days of the week. This may include walking, swimming, or biking.  Get at least 30  minutes of exercise that strengthens your muscles (resistance exercise) at least 3 days a week. This may include lifting weights or pilates.  Do not use any products that contain nicotine or tobacco. This includes cigarettes and e-cigarettes. If you need help quitting, ask your doctor.  Check your blood pressure at home as told by your doctor.  Keep all follow-up visits as told by your doctor. This is important. Medicines  Take over-the-counter and prescription medicines only as told by your doctor. Follow directions carefully.  Do not skip doses of blood pressure medicine. The medicine does not work as well if you skip doses. Skipping doses also puts you at risk for problems.  Ask your doctor about side effects or reactions to medicines that you should watch for. Contact a doctor if:  You think you are having a reaction to the medicine you are taking.  You have headaches that keep coming back (recurring).  You feel dizzy.  You have swelling in your ankles.  You have trouble with your vision. Get help right away if:  You get a very bad headache.  You start to feel confused.  You feel weak or numb.  You feel faint.  You get very bad pain in your: ? Chest. ? Belly (abdomen).  You throw up (vomit) more than once.  You have trouble breathing. Summary  Hypertension is another name for high blood pressure.  Making healthy choices can help lower blood pressure. If your blood pressure cannot  be controlled with healthy choices, you may need to take medicine. This information is not intended to replace advice given to you by your health care provider. Make sure you discuss any questions you have with your health care provider. Document Released: 01/21/2008 Document Revised: 07/02/2016 Document Reviewed: 07/02/2016 Elsevier Interactive Patient Education  2018 [employer]  Heart Failure Heart failure means your heart has trouble pumping blood. This makes it hard for your  body to work well. Heart failure is usually a long-term (chronic) condition. You must take good care of yourself and follow your doctor's treatment plan. Follow these instructions at home:  Take your heart medicine as told by your doctor. ? Do not stop taking medicine unless your doctor tells you to. ? Do not skip any dose of medicine. ? Refill your medicines before they run out. ? Take other medicines only as told by your doctor or pharmacist.  Stay active if told by your doctor. The elderly and people with severe heart failure should talk with a doctor about physical activity.  Eat heart-healthy foods. Choose foods that are without trans fat and are low in saturated fat, cholesterol, and salt (sodium). This includes fresh or frozen fruits and vegetables, fish, lean meats, fat-free or low-fat dairy foods, whole grains, and high-fiber foods. Lentils and dried peas and beans (legumes) are also good choices.  Limit salt if told by your doctor.  Cook in a healthy way. Roast, grill, broil, bake, poach, steam, or stir-fry foods.  Limit fluids as told by your doctor.  Weigh yourself every morning. Do this after you pee (urinate) and before you eat breakfast. Write down your weight to give to your doctor.  Take your blood pressure and write it down if your doctor tells you to.  Ask your doctor how to check your pulse. Check your pulse as told.  Lose weight if told by your doctor.  Stop smoking or chewing tobacco. Do not use gum or patches that help you quit without your doctor's approval.  Schedule and go to doctor visits as told.  Nonpregnant women should have no more than 1 drink a day. Men should have no more than 2 drinks a day. Talk to your doctor about drinking alcohol.  Stop illegal drug use.  Stay current with shots (immunizations).  Manage your health conditions as told by your doctor.  Learn to manage your stress.  Rest when you are tired.  If it is really hot  outside: ? Avoid intense activities. ? Use air conditioning or fans, or get in a cooler place. ? Avoid caffeine and alcohol. ? Wear loose-fitting, lightweight, and light-colored clothing.  If it is really cold outside: ? Avoid intense activities. ? Layer your clothing. ? Wear mittens or gloves, a hat, and a scarf when going outside. ? Avoid alcohol.  Learn about heart failure and get support as needed.  Get help to maintain or improve your quality of life and your ability to care for yourself as needed. Contact a doctor if:  You gain weight quickly.  You are more short of breath than usual.  You cannot do your normal activities.  You tire easily.  You cough more than normal, especially with activity.  You have any or more puffiness (swelling) in areas such as your hands, feet, ankles, or belly (abdomen).  You cannot sleep because it is hard to breathe.  You feel like your heart is beating fast (palpitations).  You get dizzy or light-headed when you stand  up. Get help right away if:  You have trouble breathing.  There is a change in mental status, such as becoming less alert or not being able to focus.  You have chest pain or discomfort.  You faint. This information is not intended to replace advice given to you by your health care provider. Make sure you discuss any questions you have with your health care provider. Document Released: 05/13/2008 Document Revised: 01/10/2016 Document Reviewed: 09/20/2012 Elsevier Interactive Patient Education  2017 [employer].

## 2017-01-27 NOTE — Progress Notes (Signed)
[name] ID: Cody [name], male    DOB: [birth year], 46 y.o.   MRN: [id number]  PCP: Bing Neighbors, FNP  Chief Complaint  [name] presents with  . Establish Care  . Hospitalization Follow-up    Subjective:  HPI Cody [name] is a 46 y.o. male presents to establish care and for hospital follow-up. Cody [name] has an extensive history of hypertensive urgency, medication noncompliance, DVT, and heart failure EF of 30-35% (11/13/2016).  Cody [name] presented to [facility] emergency department on 01/22/2017 with the complaint of leg swelling. He reported to the Provider at [facility] during recent ED admission that he had been diagnosed with a DVT at [facility] however, care everywhere records from admission at [facility] 11/11/2016,  show that  was found not to have a DVT and anticoagulant therapy was stopped.  While at concern on 2018 from a venous Dopplers were performed and there was no evidence of any DVT. He was placed on Eliquis for DVT prevention. Cody [name] was found to be extremely hypertensive and reported that he was out of his blood pressure medicine and was unable to afford to purchase medication. Prior medical records from [facility] indicate that Cody [name] has been noncompliant with blood pressure medications in the past.He also had an echo during his admission at [facility] back in March 2018 which showed an estimated EF of 30-35%. During his discharge he was advised to follow up with cardiology, obtain a sleep study, and establish with a primary care provider. None of these actions have taken place Cody. Cody [name] presents very hypertensive and explains that he has not taken any of his medications. He reports no improvement of leg swelling. He denies shortness of breath, chest pain, headache or dizziness.  He requests a refill on his blood pressure medications Cody.   Social History   Social History  . Marital status: Single    Spouse name: N/A  . Number of children: N/A  . Years of  education: N/A   Occupational History  . Not on file.   Social History Main Topics  . Smoking status: Never Smoker  . Smokeless tobacco: Never Used  . Alcohol use No  . Drug use: No  . Sexual activity: Yes   Other Topics Concern  . Not on file   Social History Narrative   Lives in [place].  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    Family History  Problem Relation Age of Onset  . Asthma Mother        alive and well  . Hypertension Father        alive  . Lung cancer Father    Review of Systems See history of present illness [name] Active Problem List   Diagnosis Date Noted  . Hypertensive emergency 06/18/2016  . H/O noncompliance with medical treatment, presenting hazards to health   . DVT (deep venous thrombosis) (HCC) 02/20/2016  . CKD (chronic kidney disease) stage 3, GFR 30-59 ml/min 02/20/2016  . Accelerated hypertension 02/20/2016    No Known Allergies  Prior to Admission medications   Medication Sig Start Date End Date Taking? Authorizing Provider  apixaban (ELIQUIS) 5 MG TABS tablet Take 2 pills twice daily (10 mg)  for 7 days.  Then take 1 pill (5 mg)  twice daily. 01/22/17  Yes Mackuen, Courteney Lyn, MD  hydrochlorothiazide (HYDRODIURIL) 25 MG tablet Take 1 tablet (25 mg total) by mouth daily. 01/22/17  Yes Mackuen,  Courteney Lyn, MD  amLODipine (NORVASC) 10 MG tablet Take 1 tablet (10 mg total) by mouth daily. [name] not taking: Reported on 01/27/2017 10/31/16   Derwood Kaplan, MD  carvedilol (COREG) 12.5 MG tablet Take 1 tablet (12.5 mg total) by mouth 2 (two) times daily with a meal. [name] not taking: Reported on 01/27/2017 10/31/16   Derwood Kaplan, MD  furosemide (LASIX) 40 MG tablet Take 1 tablet (40 mg total) by mouth daily. [name] not taking: Reported on 01/27/2017 10/31/16   Derwood Kaplan, MD  hydrALAZINE (APRESOLINE) 50 MG tablet Take 1 tablet (50 mg total) by mouth 3 (three) times daily. [name] not taking: Reported on  01/27/2017 10/31/16   Derwood Kaplan, MD  lisinopril (PRINIVIL,ZESTRIL) 40 MG tablet Take 40 mg by mouth daily.    [provider]  potassium chloride SA (K-DUR,KLOR-CON) 20 MEQ tablet Take 1 tablet (20 mEq total) by mouth daily. [name] not taking: Reported on 01/27/2017 02/22/16   Lonia Blood, MD  rivaroxaban (XARELTO) 20 MG TABS tablet Take 1 tablet (20 mg total) by mouth daily with supper. [name] not taking: Reported on 01/27/2017 06/19/16   Narda Bonds, MD    Past Medical, Surgical Family and Social History reviewed and updated.    Objective:   Cody's Vitals   01/27/17 1054 01/27/17 1102  BP: (!) 200/100 (!) 190/102  Pulse: 80   Resp: 16   Temp: 97.9 F (36.6 C)   TempSrc: Oral   SpO2: 95%   Weight: 270 lb (122.5 kg)   Height: 6' (1.829 m)     Wt Readings from Last 3 Encounters:  01/27/17 270 lb (122.5 kg)  01/22/17 260 lb (117.9 kg)  10/30/16 259 lb (117.5 kg)    Physical Exam  Constitutional: He is oriented to person, place, and time. He appears well-developed and well-nourished.  Cardiovascular: Normal rate, regular rhythm and intact distal pulses.  Exam reveals distant heart sounds.   Pulmonary/Chest: Effort normal and breath sounds normal.  Musculoskeletal: He exhibits edema.       Right lower leg: He exhibits swelling and edema.       Left lower leg: He exhibits swelling and edema.  +2 pitting edema RLE and LLE   Neurological: He is alert and oriented to person, place, and time.  Skin: Skin is warm and dry.  Psychiatric: He has a normal mood and affect. His behavior is normal. Judgment and thought content normal.   Assessment & Plan:  1. Essential hypertension - cloNIDine (CATAPRES) tablet 0.2 mg; Take 2 tablets (0.2 mg total) by mouth once. - Microalbumin, urine -Resume Hydralazine 50 mg TID, Lisinopril 40 mg daily, Coreg 12.5 mg, 2 times daily   2. Lower Extremity Edema  --Start Lasix 40 mg daily and K-Dur 20 meq every other day  3.  Congestive heart failure, unspecified HF chronicity, unspecified heart failure type (HCC) - EKG 12-Lead - CBC with Differential - COMPLETE METABOLIC PANEL WITH GFR - Brain natriuretic peptide - Hemoglobin A1c -Start Lasix 40 mg daily and K-Dur 20 meq every other day  RTC: 1 week for blood pressure recheck and 1 month for hypertension follow-up   Godfrey Pick. Tiburcio Pea, MSN, FNP-C [name]  [address] [place], [place] [address] [phone]

## 2017-01-28 LAB — POCT URINALYSIS DIP (DEVICE)
Bilirubin Urine: NEGATIVE
GLUCOSE, UA: NEGATIVE mg/dL
Ketones, ur: NEGATIVE mg/dL
LEUKOCYTES UA: NEGATIVE
NITRITE: NEGATIVE
Protein, ur: >=300 mg/dL — AB
UROBILINOGEN UA: 2 mg/dL — AB (ref 0.0–1.0)
pH: 6.5 (ref 5.0–8.0)

## 2017-01-28 LAB — MICROALBUMIN, URINE: MICROALB UR: 67.4 mg/dL

## 2017-01-28 LAB — HEMOGLOBIN A1C
HEMOGLOBIN A1C: 6 % — AB (ref <5.7)
MEAN PLASMA GLUCOSE: 126 mg/dL

## 2017-01-28 LAB — BRAIN NATRIURETIC PEPTIDE: Brain Natriuretic Peptide: 547.3 pg/mL — ABNORMAL HIGH (ref <100)

## 2017-01-30 ENCOUNTER — Encounter: Payer: Self-pay | Admitting: Family Medicine

## 2017-01-30 ENCOUNTER — Ambulatory Visit (INDEPENDENT_AMBULATORY_CARE_PROVIDER_SITE_OTHER): Payer: Self-pay | Admitting: Family Medicine

## 2017-01-30 VITALS — BP 194/110 | HR 82 | Temp 98.2°F | Resp 14 | Ht 72.0 in | Wt 267.7 lb

## 2017-01-30 DIAGNOSIS — I160 Hypertensive urgency: Secondary | ICD-10-CM

## 2017-01-30 DIAGNOSIS — R600 Localized edema: Secondary | ICD-10-CM

## 2017-01-30 DIAGNOSIS — I429 Cardiomyopathy, unspecified: Secondary | ICD-10-CM

## 2017-01-30 MED ORDER — CLONIDINE HCL 0.1 MG PO TABS
0.1000 mg | ORAL_TABLET | Freq: Once | ORAL | Status: AC
  Administered 2017-01-30: 0.1 mg via ORAL

## 2017-01-30 MED ORDER — CLONIDINE HCL 0.1 MG PO TABS
0.2000 mg | ORAL_TABLET | Freq: Once | ORAL | Status: AC
  Administered 2017-01-30: 0.2 mg via ORAL

## 2017-01-30 NOTE — Progress Notes (Signed)
Patient ID: Cody [name], male    DOB: 10-07-1970, 46 y.o.   MRN: [id number]  PCP: Bing Neighbors, FNP  Chief Complaint  Patient presents with  . Foot Swelling    not getting any better    Subjective:  HPI Cody [name] is a 46 y.o. male presents worsening left leg swelling and left foot pain. He reports today that he has not picked up any of his medications that were prescribed on his last office visit on 01/28/2017. He reports worsening of swelling of his right extremity and an pain around the ankle area in his right foot.  He is currently not adhering to a low sodium diet nor has he routinely checked his blood pressure since his last visit 3 days ago. He has elevated his lower extremities over the last couple days without improved of swelling.  He has had a recent Doppler which were negative for DVT of the right extremity.  Social History   Social History  . Marital status: Single    Spouse name: N/A  . Number of children: N/A  . Years of education: N/A   Occupational History  . Not on file.   Social History Main Topics  . Smoking status: Never Smoker  . Smokeless tobacco: Never Used  . Alcohol use No  . Drug use: No  . Sexual activity: Yes   Other Topics Concern  . Not on file   Social History Narrative   Lives in [place].  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    Family History  Problem Relation Age of Onset  . Asthma Mother        alive and well  . Hypertension Father        alive  . Lung cancer Father    Review of Systems  see history of present illness  Patient Active Problem List   Diagnosis Date Noted  . Hypertensive emergency 06/18/2016  . H/O noncompliance with medical treatment, presenting hazards to health   . DVT (deep venous thrombosis) (HCC) 02/20/2016  . CKD (chronic kidney disease) stage 3, GFR 30-59 ml/min 02/20/2016  . Accelerated hypertension 02/20/2016    No Known Allergies  Prior to Admission  medications   Medication Sig Start Date End Date Taking? Authorizing Provider  amLODipine (NORVASC) 10 MG tablet Take 1 tablet (10 mg total) by mouth daily. 01/27/17  Yes Bing Neighbors, FNP  apixaban (ELIQUIS) 5 MG TABS tablet Take 2 pills twice daily (10 mg)  for 7 days.  Then take 1 pill (5 mg)  twice daily. 01/27/17  Yes Bing Neighbors, FNP  carvedilol (COREG) 12.5 MG tablet Take 1 tablet (12.5 mg total) by mouth 2 (two) times daily with a meal. 01/27/17  Yes Bing Neighbors, FNP  furosemide (LASIX) 40 MG tablet Take 1 tablet (40 mg total) by mouth daily. 01/27/17  Yes Bing Neighbors, FNP  hydrALAZINE (APRESOLINE) 50 MG tablet Take 1 tablet (50 mg total) by mouth 3 (three) times daily. 01/27/17  Yes Bing Neighbors, FNP  hydrochlorothiazide (HYDRODIURIL) 25 MG tablet Take 1 tablet (25 mg total) by mouth daily. 01/22/17  Yes Mackuen, Courteney Lyn, MD  lisinopril (PRINIVIL,ZESTRIL) 40 MG tablet Take 1 tablet (40 mg total) by mouth daily. 01/27/17  Yes Bing Neighbors, FNP  potassium chloride SA (K-DUR,KLOR-CON) 20 MEQ tablet Take 1 tablet (20 mEq total) by mouth every other day. 01/27/17  Yes Joaquin Courts  S, FNP  rivaroxaban (XARELTO) 20 MG TABS tablet Take 1 tablet (20 mg total) by mouth daily with supper. 06/19/16  Yes Narda Bonds, MD    Past Medical, Surgical Family and Social History reviewed and updated.    Objective:   Today's Vitals   01/30/17 1322  Resp: 14  Weight: 267 lb 11.2 oz (121.4 kg)  Height: 6' (1.829 m)    Wt Readings from Last 3 Encounters:  01/30/17 267 lb 11.2 oz (121.4 kg)  01/27/17 270 lb (122.5 kg)  01/22/17 260 lb (117.9 kg)   Physical Exam  Constitutional: He appears well-developed and well-nourished.  HENT:  Head: Normocephalic and atraumatic.  Eyes: Pupils are equal, round, and reactive to light.  Neck: Normal range of motion. Neck supple.  Cardiovascular: Normal rate, regular rhythm, normal heart sounds and intact distal pulses.    Pulmonary/Chest: Effort normal. He has decreased breath sounds in the right upper field, the right middle field, the right lower field, the left upper field, the left middle field and the left lower field.  No crackles noted on exam. Diminished breath sounds throughout lung fields.  Musculoskeletal: He exhibits edema.  +3 pitting edema in the left leg  +2 pitting edema in the right leg  Skin: Skin is warm and dry.  Psychiatric: He has a normal mood and affect. His behavior is normal. Judgment and thought content normal.   Assessment & Plan:  1. Hypertensive urgency - cloNIDine (CATAPRES) tablet 0.1 mg; Take 1 tablet (0.1 mg total) by mouth once. -Reinforced again how important it is to go and pick up her medication and and take them as prescribed in order to prevent further complications associated with uncontrolled hypertension.  2. Cardiomyopathy, unspecified type [facility]) - Ambulatory referral to Cardiology, we'll attempt to get patient seen by cardiology within the next 7 days. - cloNIDine (CATAPRES) tablet 0.2 mg; Take 2 tablets (0.2 mg total) by mouth once. - Brain natriuretic peptide  3. Edema, lower extremity -Pick up medications and take as prescribed. -Continue to elevate leg and avoid to remaining in a dependent position which would only further  worsening leg swelling  RTC: Return for care in 5 days reevaluate blood pressure and leg swelling.   -The patient was given clear instructions to go to ER or return to medical center if symptoms do not improve, worsen or new problems develop. The patient verbalized understanding.  Godfrey Pick. Tiburcio Pea, MSN, FNP-C [facility]  [address] [place], [place] [address] [phone]

## 2017-01-30 NOTE — Patient Instructions (Addendum)
Please go to community health and wellness and resume all medications as prescribed. For your Lasix for the next 5 days I do want you to take 80 mg daily along with her potassium supplement. I have referred to the cardiology study should be receiving a phone call to schedule an appointment for an initial visit. Please keep feet elevated in order to reduce swelling. I'm writing out of work for the next 3 days rest and take medications and efforts to reduce her blood pressure and fluid retention.  Please check your blood pressure if your blood pressure is persistently greater than 180/90, he will need to go to the emergency room for further evaluation.  Goal blood pressure should be a low 150/90.   Please check your blood pressure on Sunday . By then you would have  been on your medications for a few days and your blood pressure readings should be more accurate.   If he develops any weakness, slurring of speech, chest pain, facial drooping, shortness of breath, productive cough, please report immediately to the emergency room.

## 2017-01-31 LAB — BRAIN NATRIURETIC PEPTIDE: BRAIN NATRIURETIC PEPTIDE: 676.2 pg/mL — AB (ref <100)

## 2017-02-03 ENCOUNTER — Ambulatory Visit: Payer: Self-pay | Admitting: Family Medicine

## 2017-02-06 ENCOUNTER — Ambulatory Visit: Payer: Self-pay | Admitting: Cardiovascular Disease

## 2017-02-07 ENCOUNTER — Encounter (HOSPITAL_COMMUNITY): Payer: Self-pay

## 2017-02-07 DIAGNOSIS — Z79899 Other long term (current) drug therapy: Secondary | ICD-10-CM | POA: Insufficient documentation

## 2017-02-07 DIAGNOSIS — Z86718 Personal history of other venous thrombosis and embolism: Secondary | ICD-10-CM | POA: Insufficient documentation

## 2017-02-07 DIAGNOSIS — I509 Heart failure, unspecified: Secondary | ICD-10-CM | POA: Insufficient documentation

## 2017-02-07 DIAGNOSIS — I129 Hypertensive chronic kidney disease with stage 1 through stage 4 chronic kidney disease, or unspecified chronic kidney disease: Secondary | ICD-10-CM | POA: Insufficient documentation

## 2017-02-07 DIAGNOSIS — I110 Hypertensive heart disease with heart failure: Secondary | ICD-10-CM | POA: Insufficient documentation

## 2017-02-07 DIAGNOSIS — R2241 Localized swelling, mass and lump, right lower limb: Secondary | ICD-10-CM | POA: Insufficient documentation

## 2017-02-07 DIAGNOSIS — N183 Chronic kidney disease, stage 3 (moderate): Secondary | ICD-10-CM | POA: Insufficient documentation

## 2017-02-07 NOTE — ED Triage Notes (Signed)
Pt complaining L foot pain and swelling. Pt denies any new injury/trauma. Pt states seen here for same. No improvement. Pt states hx of blood clots. Pt denies any chest pain or SOB.

## 2017-02-08 ENCOUNTER — Emergency Department (HOSPITAL_COMMUNITY)
Admission: EM | Admit: 2017-02-08 | Discharge: 2017-02-08 | Disposition: A | Discharge status: Home / Self Care | Payer: Self-pay | Attending: Emergency Medicine | Admitting: Emergency Medicine

## 2017-02-08 DIAGNOSIS — R600 Localized edema: Secondary | ICD-10-CM

## 2017-02-08 MED ORDER — HYDROCODONE-ACETAMINOPHEN 5-325 MG PO TABS: 1.0000 | ORAL_TABLET | Freq: Four times a day (QID) | ORAL | 0 refills | Status: DC | PRN

## 2017-02-08 NOTE — ED Provider Notes (Signed)
[facility]-EMERGENCY DEPT Provider Note   CSN: [id number] Arrival date & time: 02/07/17  2342     History   Chief Complaint Chief Complaint  Patient presents with  . Leg Swelling    HPI Cody [name] is a 46 y.o. male.  Patient with a history of HTN, CKD, medication noncompliance, presents with persistent left leg swelling and pain. Symptoms are chronic in nature and unchanged tonight. He is being seen by Jerrilyn Cairo and reports improved compliance with his medications so he thought he would see his leg get better. He is on Eliquis for DVT prophylaxis. No CP or SOB   The history is provided by the patient. No language interpreter was used.    Past Medical History:  Diagnosis Date  . CKD (chronic kidney disease), stage III    [name] 02/20/2016  . DVT (deep venous thrombosis) (HCC) 01/2016   a. RLE - Dx 01/2016 in [name] - Took Xarelto for 30 days - out x 2 wks.  [name]   . Hypertension   . Hypertensive heart disease with CHF (congestive heart failure) [facility])     Patient Active Problem List   Diagnosis Date Noted  . Hypertensive emergency 06/18/2016  . H/O noncompliance with medical treatment, presenting hazards to health   . DVT (deep venous thrombosis) (HCC) 02/20/2016  . CKD (chronic kidney disease) stage 3, GFR 30-59 ml/min 02/20/2016  . Accelerated hypertension 02/20/2016    Past Surgical History:  Procedure Laterality Date  . NO PAST SURGERIES         Home Medications    Prior to Admission medications   Medication Sig Start Date End Date Taking? Authorizing Provider  amLODipine (NORVASC) 10 MG tablet Take 1 tablet (10 mg total) by mouth daily. 01/27/17   Bing Neighbors, FNP  carvedilol (COREG) 12.5 MG tablet Take 1 tablet (12.5 mg total) by mouth 2 (two) times daily with a meal. 01/27/17   Bing Neighbors, FNP  furosemide (LASIX) 40 MG tablet Take 1 tablet (40 mg total) by mouth daily. 01/27/17   Bing Neighbors, FNP  hydrALAZINE (APRESOLINE) 50 MG  tablet Take 1 tablet (50 mg total) by mouth 3 (three) times daily. 01/27/17   Bing Neighbors, FNP  hydrochlorothiazide (HYDRODIURIL) 25 MG tablet Take 1 tablet (25 mg total) by mouth daily. 01/22/17   Mackuen, Courteney Lyn, MD  lisinopril (PRINIVIL,ZESTRIL) 40 MG tablet Take 1 tablet (40 mg total) by mouth daily. 01/27/17   Bing Neighbors, FNP  potassium chloride SA (K-DUR,KLOR-CON) 20 MEQ tablet Take 1 tablet (20 mEq total) by mouth every other day. 01/27/17   Bing Neighbors, FNP  rivaroxaban (XARELTO) 20 MG TABS tablet Take 1 tablet (20 mg total) by mouth daily with supper. 06/19/16   Narda Bonds, MD    Family History Family History  Problem Relation Age of Onset  . Asthma Mother        alive and well  . Hypertension Father        alive  . Lung cancer Father     Social History Social History  Substance Use Topics  . Smoking status: Never Smoker  . Smokeless tobacco: Never Used  . Alcohol use No     Allergies   Patient has no known allergies.   Review of Systems Review of Systems  Constitutional: Negative for chills and fever.  Respiratory: Negative.  Negative for shortness of breath.   Cardiovascular: Positive for leg swelling. Negative for chest  pain.  Gastrointestinal: Negative.   Musculoskeletal:       See HPI  Skin: Negative.  Negative for color change and wound.  Neurological: Negative.  Negative for numbness.     Physical Exam Updated Vital Signs BP (!) 177/98 (BP Location: Left Arm)   Pulse 70   Temp 98.5 F (36.9 C) (Oral)   Resp 18   SpO2 98%   Physical Exam  Constitutional: He is oriented to person, place, and time. He appears well-developed and well-nourished.  HENT:  Head: Normocephalic.  Neck: Normal range of motion. Neck supple.  Cardiovascular: Normal rate.   Pulmonary/Chest: Effort normal.  Abdominal: Soft. Bowel sounds are normal. There is no tenderness. There is no rebound and no guarding.  Musculoskeletal: Normal range of  motion.  Non-pitting edema to left lower extremity. Minimally tender. No redness or warmth. No skin breakdown or drainage/weeping.  Neurological: He is alert and oriented to person, place, and time.  Skin: Skin is warm and dry. No rash noted.  Psychiatric: He has a normal mood and affect.     ED Treatments / Results  Labs (all labs ordered are listed, but only abnormal results are displayed) Labs Reviewed - No data to display  EKG  EKG Interpretation None       Radiology No results found.  Procedures Procedures (including critical care time)  Medications Ordered in ED Medications - No data to display   Initial Impression / Assessment and Plan / ED Course  I have reviewed the triage vital signs and the nursing notes.  Pertinent labs & imaging results that were available during my care of the patient were reviewed by me and considered in my medical decision making (see chart for details).     Chart reviewed. Patient's blood pressure tonight is 177/98, much better than history, indicating he is taking his medication. Left leg is swollen without evidence of infection. He is on Eliquis with recent DVT studies performed while patient was having same symptoms were negative for clot. No CP or SOB. VSS. He can be discharged home. Will provide pain relief and encourage him to keep his scheduled appointment with PCP.  Final Clinical Impressions(s) / ED Diagnoses   Final diagnoses:  None   1. Chronic RLE edema  New Prescriptions New Prescriptions   No medications on file     Elpidio Anis, Cordelia Poche 02/08/17 0356    Elpidio Anis, PA-C 02/08/17 [id number]    Pricilla Loveless, MD 02/09/17 [phone]

## 2017-02-08 NOTE — ED Notes (Signed)
Pt understood dc material. NAD Noted. Scripts given at dc. 

## 2017-02-10 ENCOUNTER — Encounter: Payer: Self-pay | Admitting: Cardiovascular Disease

## 2017-02-12 ENCOUNTER — Ambulatory Visit: Payer: Self-pay | Admitting: Family Medicine

## 2017-02-13 ENCOUNTER — Ambulatory Visit: Payer: Self-pay | Admitting: Family Medicine

## 2017-02-27 ENCOUNTER — Ambulatory Visit: Payer: Self-pay | Admitting: Family Medicine

## 2017-04-06 ENCOUNTER — Telehealth: Payer: Self-pay

## 2017-04-06 NOTE — Telephone Encounter (Signed)
Tried to call patient back and no answer and no vm

## 2017-04-06 NOTE — Telephone Encounter (Signed)
Spoke with patient and let him know that he needs to come in for a appointment before [facility] can refill any medication. Patient will schedule for next week

## 2017-04-17 ENCOUNTER — Ambulatory Visit: Payer: Self-pay | Admitting: Family Medicine

## 2018-03-16 IMAGING — CR DG CHEST 2V
2 series · 2 of 2 positions shown · non-contrast
Comparison: 02/20/2016

CLINICAL DATA: Shortness of breath for 2-3 days

EXAM:
CHEST  2 VIEW

[chest pa]
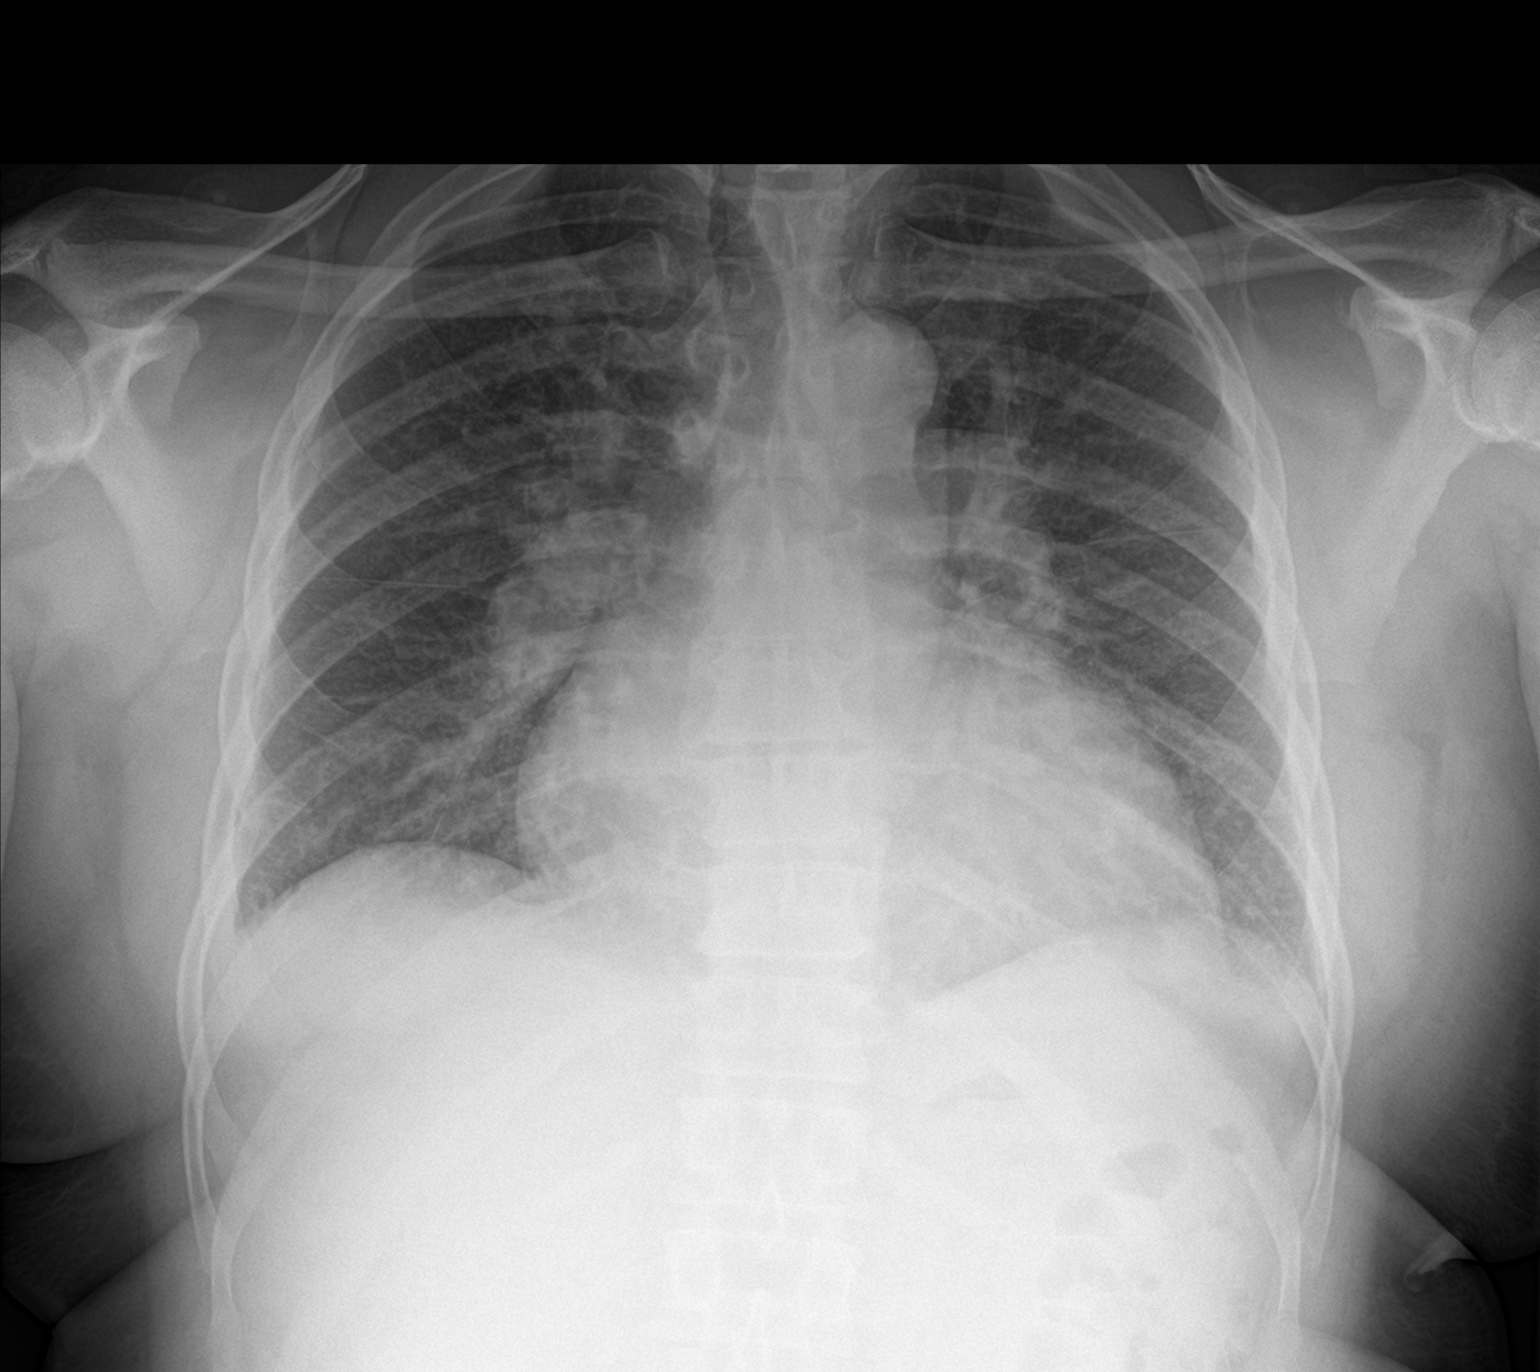

[chest lat]
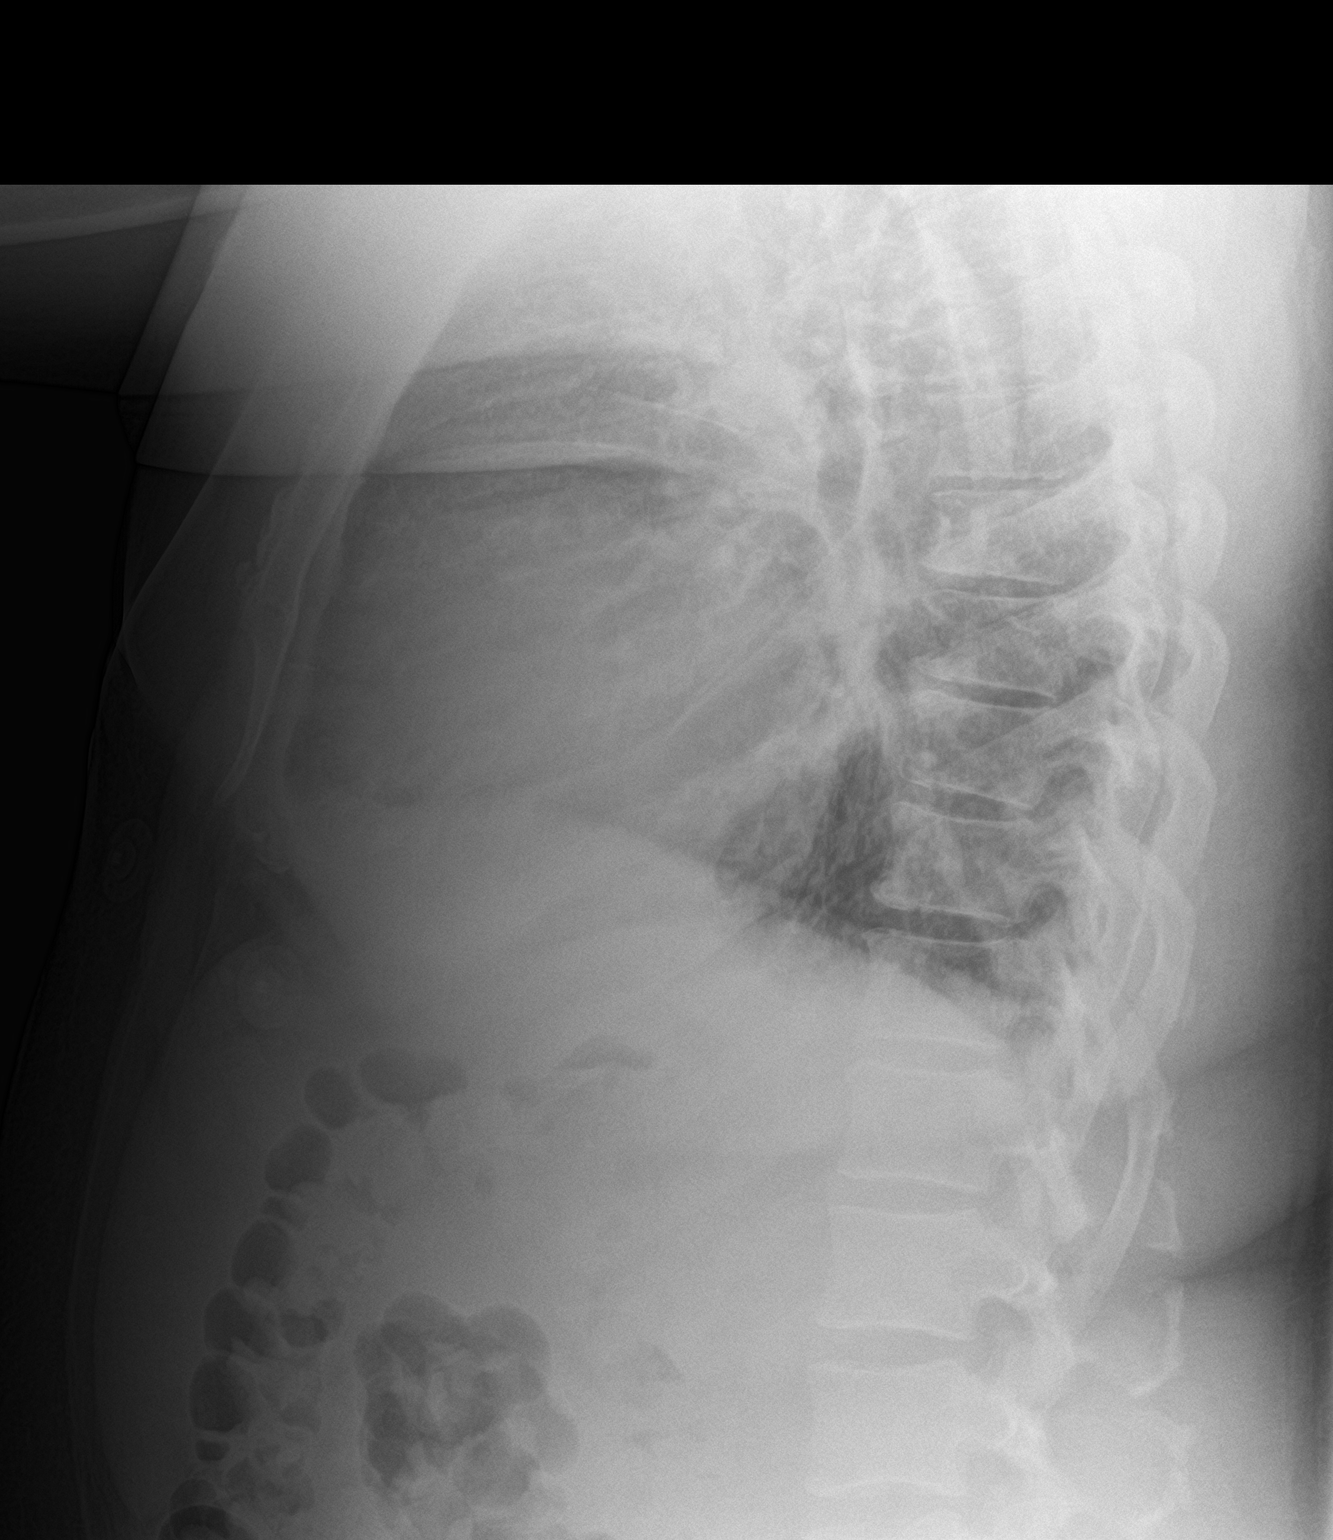

[2 of 2 positions shown; findings below may reference images not displayed]

FINDINGS: Cardiac shadow remains enlarged. Increased central vascular
congestion with mild interstitial edema is noted consistent with
CHF. No acute bony abnormality is seen. No sizable effusion is
noted.
IMPRESSION: Mild CHF.

## 2018-07-17 ENCOUNTER — Encounter (HOSPITAL_COMMUNITY): Payer: Self-pay | Admitting: Emergency Medicine

## 2018-07-17 ENCOUNTER — Other Ambulatory Visit: Payer: Self-pay

## 2018-07-17 ENCOUNTER — Emergency Department (HOSPITAL_COMMUNITY): Payer: Medicaid Other

## 2018-07-17 ENCOUNTER — Inpatient Hospital Stay (HOSPITAL_COMMUNITY)
Admission: EM | Admit: 2018-07-17 | Discharge: 2018-07-18 | DRG: 291 | Disposition: A | Discharge status: Home / Self Care | Payer: Medicaid Other | Attending: Internal Medicine | Admitting: Internal Medicine

## 2018-07-17 DIAGNOSIS — I5023 Acute on chronic systolic (congestive) heart failure: Secondary | ICD-10-CM | POA: Diagnosis present

## 2018-07-17 DIAGNOSIS — Z86718 Personal history of other venous thrombosis and embolism: Secondary | ICD-10-CM | POA: Diagnosis not present

## 2018-07-17 DIAGNOSIS — I4891 Unspecified atrial fibrillation: Secondary | ICD-10-CM | POA: Diagnosis present

## 2018-07-17 DIAGNOSIS — Z8249 Family history of ischemic heart disease and other diseases of the circulatory system: Secondary | ICD-10-CM

## 2018-07-17 DIAGNOSIS — D631 Anemia in chronic kidney disease: Secondary | ICD-10-CM | POA: Diagnosis present

## 2018-07-17 DIAGNOSIS — R0602 Shortness of breath: Secondary | ICD-10-CM

## 2018-07-17 DIAGNOSIS — Z9114 Patient's other noncompliance with medication regimen: Secondary | ICD-10-CM | POA: Diagnosis not present

## 2018-07-17 DIAGNOSIS — I130 Hypertensive heart and chronic kidney disease with heart failure and stage 1 through stage 4 chronic kidney disease, or unspecified chronic kidney disease: Secondary | ICD-10-CM | POA: Diagnosis present

## 2018-07-17 DIAGNOSIS — Z9119 Patient's noncompliance with other medical treatment and regimen: Secondary | ICD-10-CM | POA: Diagnosis not present

## 2018-07-17 DIAGNOSIS — I447 Left bundle-branch block, unspecified: Secondary | ICD-10-CM | POA: Diagnosis present

## 2018-07-17 DIAGNOSIS — N183 Chronic kidney disease, stage 3 unspecified: Secondary | ICD-10-CM | POA: Diagnosis present

## 2018-07-17 DIAGNOSIS — Z7901 Long term (current) use of anticoagulants: Secondary | ICD-10-CM

## 2018-07-17 DIAGNOSIS — Z6839 Body mass index (BMI) 39.0-39.9, adult: Secondary | ICD-10-CM

## 2018-07-17 DIAGNOSIS — Z79899 Other long term (current) drug therapy: Secondary | ICD-10-CM

## 2018-07-17 DIAGNOSIS — I161 Hypertensive emergency: Secondary | ICD-10-CM | POA: Diagnosis present

## 2018-07-17 DIAGNOSIS — Z791 Long term (current) use of non-steroidal anti-inflammatories (NSAID): Secondary | ICD-10-CM

## 2018-07-17 DIAGNOSIS — Z801 Family history of malignant neoplasm of trachea, bronchus and lung: Secondary | ICD-10-CM

## 2018-07-17 DIAGNOSIS — I5022 Chronic systolic (congestive) heart failure: Secondary | ICD-10-CM | POA: Diagnosis present

## 2018-07-17 DIAGNOSIS — R609 Edema, unspecified: Secondary | ICD-10-CM

## 2018-07-17 DIAGNOSIS — K5900 Constipation, unspecified: Secondary | ICD-10-CM | POA: Diagnosis present

## 2018-07-17 DIAGNOSIS — Z91199 Patient's noncompliance with other medical treatment and regimen due to unspecified reason: Secondary | ICD-10-CM

## 2018-07-17 DIAGNOSIS — Z825 Family history of asthma and other chronic lower respiratory diseases: Secondary | ICD-10-CM | POA: Diagnosis not present

## 2018-07-17 DIAGNOSIS — I10 Essential (primary) hypertension: Secondary | ICD-10-CM

## 2018-07-17 HISTORY — DX: Unspecified atrial fibrillation: I48.91

## 2018-07-17 LAB — COMPREHENSIVE METABOLIC PANEL
ALK PHOS: 56 U/L (ref 38–126)
ALT: 20 U/L (ref 0–44)
ANION GAP: 10 (ref 5–15)
AST: 22 U/L (ref 15–41)
Albumin: 3.5 g/dL (ref 3.5–5.0)
BILIRUBIN TOTAL: 1.2 mg/dL (ref 0.3–1.2)
BUN: 26 mg/dL — ABNORMAL HIGH (ref 6–20)
CALCIUM: 9.2 mg/dL (ref 8.9–10.3)
CO2: 28 mmol/L (ref 22–32)
CREATININE: 2.05 mg/dL — AB (ref 0.61–1.24)
Chloride: 104 mmol/L (ref 98–111)
GFR calc Af Amer: 43 mL/min — ABNORMAL LOW (ref >60)
GFR calc non Af Amer: 37 mL/min — ABNORMAL LOW (ref >60)
Glucose, Bld: 72 mg/dL (ref 70–99)
Potassium: 3.4 mmol/L — ABNORMAL LOW (ref 3.5–5.1)
SODIUM: 142 mmol/L (ref 135–145)
Total Protein: 6.3 g/dL — ABNORMAL LOW (ref 6.5–8.1)

## 2018-07-17 LAB — CBC WITH DIFFERENTIAL/PLATELET
Abs Immature Granulocytes: 0.02 10*3/uL (ref 0.00–0.07)
Basophils Absolute: 0.1 10*3/uL (ref 0.0–0.1)
Basophils Relative: 1 %
EOS PCT: 13 %
Eosinophils Absolute: 0.8 10*3/uL — ABNORMAL HIGH (ref 0.0–0.5)
HCT: 42.8 % (ref 39.0–52.0)
HEMOGLOBIN: 12.3 g/dL — AB (ref 13.0–17.0)
Immature Granulocytes: 0 %
LYMPHS PCT: 18 %
Lymphs Abs: 1.2 10*3/uL (ref 0.7–4.0)
MCH: 24.3 pg — AB (ref 26.0–34.0)
MCHC: 28.7 g/dL — AB (ref 30.0–36.0)
MCV: 84.4 fL (ref 80.0–100.0)
MONO ABS: 0.7 10*3/uL (ref 0.1–1.0)
MONOS PCT: 11 %
Neutro Abs: 3.6 10*3/uL (ref 1.7–7.7)
Neutrophils Relative %: 57 %
Platelets: 292 10*3/uL (ref 150–400)
RBC: 5.07 MIL/uL (ref 4.22–5.81)
RDW: 15.6 % — ABNORMAL HIGH (ref 11.5–15.5)
WBC: 6.3 10*3/uL (ref 4.0–10.5)
nRBC: 0 % (ref 0.0–0.2)

## 2018-07-17 LAB — I-STAT TROPONIN, ED: Troponin i, poc: 0.12 ng/mL (ref 0.00–0.08)

## 2018-07-17 LAB — PROTIME-INR
INR: 2.4
Prothrombin Time: 25.8 seconds — ABNORMAL HIGH (ref 11.4–15.2)

## 2018-07-17 LAB — BRAIN NATRIURETIC PEPTIDE: B NATRIURETIC PEPTIDE 5: 814.4 pg/mL — AB (ref 0.0–100.0)

## 2018-07-17 MED ORDER — ONDANSETRON HCL 4 MG/2ML IJ SOLN: 4.0000 mg | Freq: Four times a day (QID) | INTRAMUSCULAR | Status: DC | PRN

## 2018-07-17 MED ORDER — CARVEDILOL 12.5 MG PO TABS
12.5000 mg | ORAL_TABLET | Freq: Two times a day (BID) | ORAL | Status: DC
  Administered 2018-07-17: 12.5 mg via ORAL
  Filled 2018-07-17: qty 1

## 2018-07-17 MED ORDER — FUROSEMIDE 10 MG/ML IJ SOLN
60.0000 mg | Freq: Two times a day (BID) | INTRAMUSCULAR | Status: DC
  Administered 2018-07-18: 60 mg via INTRAVENOUS
  Filled 2018-07-17: qty 6

## 2018-07-17 MED ORDER — POTASSIUM CHLORIDE CRYS ER 20 MEQ PO TBCR
40.0000 meq | EXTENDED_RELEASE_TABLET | Freq: Two times a day (BID) | ORAL | Status: DC
  Administered 2018-07-17 – 2018-07-18 (×2): 40 meq via ORAL
  Filled 2018-07-17 (×2): qty 2

## 2018-07-17 MED ORDER — RIVAROXABAN 20 MG PO TABS: 20.0000 mg | ORAL_TABLET | Freq: Every day | ORAL | Status: DC

## 2018-07-17 MED ORDER — CARVEDILOL 12.5 MG PO TABS: 12.5000 mg | ORAL_TABLET | Freq: Two times a day (BID) | ORAL | Status: DC

## 2018-07-17 MED ORDER — SODIUM CHLORIDE 0.9% FLUSH
3.0000 mL | Freq: Two times a day (BID) | INTRAVENOUS | Status: DC
  Administered 2018-07-17 – 2018-07-18 (×2): 3 mL via INTRAVENOUS

## 2018-07-17 MED ORDER — ACETAMINOPHEN 325 MG PO TABS: 650.0000 mg | ORAL_TABLET | ORAL | Status: DC | PRN

## 2018-07-17 MED ORDER — FUROSEMIDE 10 MG/ML IJ SOLN
60.0000 mg | Freq: Once | INTRAMUSCULAR | Status: AC
  Administered 2018-07-17: 60 mg via INTRAVENOUS
  Filled 2018-07-17: qty 6

## 2018-07-17 MED ORDER — SODIUM CHLORIDE 0.9 % IV SOLN: 250.0000 mL | INTRAVENOUS | Status: DC | PRN

## 2018-07-17 MED ORDER — ALPRAZOLAM 0.25 MG PO TABS: 0.2500 mg | ORAL_TABLET | Freq: Two times a day (BID) | ORAL | Status: DC | PRN

## 2018-07-17 MED ORDER — SODIUM CHLORIDE 0.9% FLUSH: 3.0000 mL | INTRAVENOUS | Status: DC | PRN

## 2018-07-17 MED ORDER — AMLODIPINE BESYLATE 10 MG PO TABS
10.0000 mg | ORAL_TABLET | Freq: Every day | ORAL | Status: DC
  Administered 2018-07-17 – 2018-07-18 (×2): 10 mg via ORAL
  Filled 2018-07-17 (×2): qty 1

## 2018-07-17 MED ORDER — HYDRALAZINE HCL 25 MG PO TABS
50.0000 mg | ORAL_TABLET | Freq: Once | ORAL | Status: AC
  Administered 2018-07-17: 50 mg via ORAL
  Filled 2018-07-17: qty 2

## 2018-07-17 NOTE — H&P (Signed)
History and Physical   Cody [name]:[id number] DOB: 01-28-1971 DOA: 07/17/2018  Referring MD/NP/PA: Dr. Madilyn Hook  PCP: Patient, No Pcp Per   Outpatient Specialists: None  Patient coming from: Home  Chief Complaint: Shortness of breath  HPI: Cody [name] is a 47 y.o. male with medical history significant of systolic dysfunction CHF with previous echocardiogram showing EF of 30%, hypertension, atrial fibrillation on chronic anticoagulation.  Medication noncompliance.  History of DVT on chronic kidney disease stage III who came to the ER complaining of progressive shortness of breath and cough.  Patient was taken Lasix but due to chronic kidney disease also on hydralazine.  He complained that the hydralazine was making his breathing worse.  He also has not been able to get most of his medications especially the Lasix.  He has therefore not been taking most of his medicines.  He has significant fluid overload and was noted to have evidence of worsening CHF.  He was given IV Lasix in the ER and is being admitted to the hospital with acute exacerbation of systolic dysfunction CHF.  Symptoms largely due to noncompliance due to his inability to get his medications.  Denied any chest pain, denied any orthopnea, he has PND.  Patient has previous history of DVT.  Initial evaluation now showed no evidence of DVT.Cody [name]  ED Course: Temperature 98.7 blood pressure 162/123 with a pulse 103 respirate 31 oxygen sats 100% on 2 L.  White count is 6.3 hemoglobin 12.3.  Sodium is 142 potassium 3.4 chloride 1040.2 of 28 with BUN 26 creatinine 2.05.  PT/INR 25.8/2.40 with glucose 72.  Troponin is 0.12 BNP 814.  Chest x-ray showed cardiac enlargement with pulmonary vascular congestion.  EKG shows sinus tachycardia with a rate of 122, left bundle branch block and multiple findings that are not new.  Review of Systems: As per HPI otherwise 10 point review of systems negative.    Past Medical History:  Diagnosis Date  .  Atrial fibrillation (HCC)   . CKD (chronic kidney disease), stage III (HCC)    Cody [name] 02/20/2016  . DVT (deep venous thrombosis) (HCC) 01/2016   a. RLE - Dx 01/2016 in [name] - Took Xarelto for 30 days - out x 2 wks.  Cody [name] Dyspnea   . Hypertension   . Hypertensive heart disease with CHF (congestive heart failure) (HCC)     Past Surgical History:  Procedure Laterality Date  . NO PAST SURGERIES       reports that he has never smoked. He has never used smokeless tobacco. He reports that he does not drink alcohol or use drugs.  No Known Allergies  Family History  Problem Relation Age of Onset  . Asthma Mother        alive and well  . Hypertension Father        alive  . Lung cancer Father      Prior to Admission medications   Medication Sig Start Date End Date Taking? Authorizing Provider  amLODipine (NORVASC) 10 MG tablet Take 1 tablet (10 mg total) by mouth daily. 01/27/17  Yes Bing Neighbors, FNP  carvedilol (COREG) 12.5 MG tablet Take 1 tablet (12.5 mg total) by mouth 2 (two) times daily with a meal. 01/27/17  Yes Bing Neighbors, FNP  furosemide (LASIX) 40 MG tablet Take 1 tablet (40 mg total) by mouth daily. 01/27/17  Yes Bing Neighbors, FNP  hydrALAZINE (APRESOLINE) 50 MG tablet Take 1 tablet (50 mg total) by mouth 3 (  three) times daily. 01/27/17  Yes Bing Neighbors, FNP  hydrochlorothiazide (HYDRODIURIL) 25 MG tablet Take 1 tablet (25 mg total) by mouth daily. 01/22/17  Yes Mackuen, Courteney Lyn, MD  ibuprofen (ADVIL,MOTRIN) 600 MG tablet Take 600 mg by mouth 3 (three) times daily. 06/24/18  Yes [provider]  rivaroxaban (XARELTO) 20 MG TABS tablet Take 1 tablet (20 mg total) by mouth daily with supper. 06/19/16  Yes Narda Bonds, MD  WARFARIN SODIUM PO Take 1 tablet by mouth daily.   Yes [provider]  HYDROcodone-acetaminophen (NORCO/VICODIN) 5-325 MG tablet Take 1 tablet by mouth every 6 (six) hours as needed. Patient not taking: Reported on  07/17/2018 02/08/17   Elpidio Anis, PA-C  lisinopril (PRINIVIL,ZESTRIL) 40 MG tablet Take 1 tablet (40 mg total) by mouth daily. Patient not taking: Reported on 07/17/2018 01/27/17   Bing Neighbors, FNP  potassium chloride SA (K-DUR,KLOR-CON) 20 MEQ tablet Take 1 tablet (20 mEq total) by mouth every other day. Patient not taking: Reported on 07/17/2018 01/27/17   Bing Neighbors, FNP    Physical Exam: Vitals:   07/17/18 1840 07/17/18 1841 07/17/18 1845 07/17/18 1930  BP:   (!) 187/138 (!) 175/135  Pulse: 92 (!) 103 86 91  Resp: 14 17 18  (!) 29  Temp:      TempSrc:      SpO2: 97% 99% 99% 97%  Weight:      Height:          Constitutional: NAD, calm, comfortable, evidence of acute fluid overload Vitals:   07/17/18 1840 07/17/18 1841 07/17/18 1845 07/17/18 1930  BP:   (!) 187/138 (!) 175/135  Pulse: 92 (!) 103 86 91  Resp: 14 17 18  (!) 29  Temp:      TempSrc:      SpO2: 97% 99% 99% 97%  Weight:      Height:       Eyes: PERRL, lids and conjunctivae normal ENMT: Mucous membranes are moist. Posterior pharynx clear of any exudate or lesions.Normal dentition.  Neck: normal, supple, no masses, no thyromegaly JVD to angle of jaw Respiratory: Diffuse crackles bilaterally, no wheezing. Normal respiratory effort. No accessory muscle use.  Cardiovascular: Regular rate and rhythm, no murmurs / rubs / gallops. 2+ extremity edema. 2+ pedal pulses. No carotid bruits.  Abdomen: no tenderness, no masses palpated. No hepatosplenomegaly. Bowel sounds positive.  Musculoskeletal: no clubbing / cyanosis. No joint deformity upper and lower extremities. Good ROM, no contractures. Normal muscle tone.  Skin: no rashes, lesions, ulcers. No induration Neurologic: CN 2-12 grossly intact. Sensation intact, DTR normal. Strength 5/5 in all 4.  Psychiatric: Normal judgment and insight. Alert and oriented x 3. Normal mood.     Labs on Admission: I have personally reviewed following labs and imaging  studies  CBC: Recent Labs  Lab 07/17/18 1644  WBC 6.3  NEUTROABS 3.6  HGB 12.3*  HCT 42.8  MCV 84.4  PLT 292   Basic Metabolic Panel: Recent Labs  Lab 07/17/18 1644  NA 142  K 3.4*  CL 104  CO2 28  GLUCOSE 72  BUN 26*  CREATININE 2.05*  CALCIUM 9.2   GFR: Estimated Creatinine Clearance: 60.2 mL/min (A) (by C-G formula based on SCr of 2.05 mg/dL (H)). Liver Function Tests: Recent Labs  Lab 07/17/18 1644  AST 22  ALT 20  ALKPHOS 56  BILITOT 1.2  PROT 6.3*  ALBUMIN 3.5   No results for input(s): LIPASE, AMYLASE in the last  168 hours. No results for input(s): AMMONIA in the last 168 hours. Coagulation Profile: Recent Labs  Lab 07/17/18 1644  INR 2.40   Cardiac Enzymes: No results for input(s): CKTOTAL, CKMB, CKMBINDEX, TROPONINI in the last 168 hours. BNP (last 3 results) No results for input(s): PROBNP in the last 8760 hours. HbA1C: No results for input(s): HGBA1C in the last 72 hours. CBG: No results for input(s): GLUCAP in the last 168 hours. Lipid Profile: No results for input(s): CHOL, HDL, LDLCALC, TRIG, CHOLHDL, LDLDIRECT in the last 72 hours. Thyroid Function Tests: No results for input(s): TSH, T4TOTAL, FREET4, T3FREE, THYROIDAB in the last 72 hours. Anemia Panel: No results for input(s): VITAMINB12, FOLATE, FERRITIN, TIBC, IRON, RETICCTPCT in the last 72 hours. Urine analysis:    Component Value Date/Time   LABSPEC >=1.030 01/27/2017 1343   PHURINE 6.5 01/27/2017 1343   GLUCOSEU NEGATIVE 01/27/2017 1343   HGBUR TRACE (A) 01/27/2017 1343   BILIRUBINUR NEGATIVE 01/27/2017 1343   KETONESUR NEGATIVE 01/27/2017 1343   PROTEINUR >=300 (A) 01/27/2017 1343   UROBILINOGEN 2.0 (H) 01/27/2017 1343   NITRITE NEGATIVE 01/27/2017 1343   LEUKOCYTESUR NEGATIVE 01/27/2017 1343   Sepsis Labs: @LABRCNTIP (procalcitonin:4,lacticidven:4) )No results found for this or any previous visit (from the past 240 hour(s)).   Radiological Exams on Admission: Dg  Chest 2 View  Result Date: 07/17/2018 CLINICAL DATA:  Shortness of breath.  Bilateral ankle swelling. EXAM: CHEST - 2 VIEW COMPARISON:  06/17/2016 FINDINGS: Moderate cardiac enlargement. Pulmonary vascular congestion. No pleural effusion or overt edema. No airspace opacities. The visualized osseous structures are unremarkable. IMPRESSION: 1. Cardiac enlargement with pulmonary vascular congestion. Electronically Signed   By: Signa Kell M.D.   On: 07/17/2018 17:33    EKG: Independently reviewed.  Sinus tachycardia with a rate of 122.  Left bundle branch block, nonspecific ST changes  Assessment/Plan Principal Problem:   Systolic CHF, acute on chronic (HCC) Active Problems:   CKD (chronic kidney disease) stage 3, GFR 30-59 ml/min (HCC)   H/O noncompliance with medical treatment, presenting hazards to health   Hypertensive emergency     #1 acute decompensation of systolic CHF: Primarily due to noncompliance.  Patient will be admitted to telemetry.  Aggressive diuresis.  Monitor renal function at the same time.  Cardiology will be consulted in the morning to optimize therapy.  Social work also to be consulted for patient to have referral to memory care physician as well as medication assistance.  #2 hypertension: Blood pressure at this point is low to uncontrolled with hypertensive urgency.  Patient has not been taking his hydralazine due to reported side effects.  Again cardiology will be involved.  His renal function is making ACE inhibitor is less desirable.  #3 medication noncompliance: secondary to lack of insurance at this point.  Patient will get social work assistance and counseling.  Would aim at getting him on the $4 list of medication if possible.  #4 chronic kidney disease stage III: Again monitor renal function closely.  Avoid nephrotoxic medications.  DVT prophylaxis: On Xarelto Code Status: Full Family Communication: No family Disposition Plan: Home Consults called:  Cardiology should be called in the morning Admission status: Inpatient  Severity of Illness: The appropriate patient status for this patient is INPATIENT. Inpatient status is judged to be reasonable and necessary in order to provide the required intensity of service to ensure the patient's safety. The patient's presenting symptoms, physical exam findings, and initial radiographic and laboratory data in the context of their chronic  comorbidities is felt to place them at high risk for further clinical deterioration. Furthermore, it is not anticipated that the patient will be medically stable for discharge from the hospital within 2 midnights of admission. The following factors support the patient status of inpatient.   " The patient's presenting symptoms include shortness of breath. " The worrisome physical exam findings include evidence of fluid overload. " The initial radiographic and laboratory data are worrisome because of chest x-ray consistent with pulmonary edema. " The chronic co-morbidities include systolic dysfunction CHF with medication noncompliance.   * I certify that at the point of admission it is my clinical judgment that the patient will require inpatient hospital care spanning beyond 2 midnights from the point of admission due to high intensity of service, high risk for further deterioration and high frequency of surveillance required.Lonia Blood MD [facility] Pager [phone]  If 7PM-7AM, please contact night-coverage [email or web address] Password [facility]  07/17/2018, 7:44 PM

## 2018-07-17 NOTE — ED Triage Notes (Signed)
Pt reports SOB, leg swelling, and constipation x1 week.

## 2018-07-17 NOTE — ED Provider Notes (Signed)
[facility] EMERGENCY DEPARTMENT Provider Note   CSN: [id number] Arrival date & time: 07/17/18  1625     History   Chief Complaint Chief Complaint  Patient presents with  . Shortness of Breath  . Leg Swelling  . Constipation    HPI Cody [name] is a 47 y.o. male.  The history is provided by the patient and medical records. No language interpreter was used.  Shortness of Breath  Associated symptoms include leg swelling. Pertinent negatives include no cough, no wheezing, no chest pain and no vomiting.  Constipation     Cody [name] is a 47 y.o. male  with a PMH of A. fib, hypertension, CHF, prior DVT on warfarin, CKD who presents to the Emergency Department complaining of persistent shortness of breath over the last week.  He endorses dyspnea with exertion, very little shortness of breath at rest.  He gets short of breath lying flat as well.  Associated with bilateral lower extremity edema (left greater than right).  He denies any chest pain.  He has been taking his Lasix as directed without any missed doses.  He reports that he did not take his warfarin yesterday because he was feeling so badly, but has been taking it as directed up until yesterday.  She does additionally report constipation, however had a bowel movement this morning.  It was much more firm than usual and painful.  Denies any abdominal pain, nausea, vomiting or blood in the stool.  No cough, congestion or fever.   Past Medical History:  Diagnosis Date  . Atrial fibrillation (HCC)   . CKD (chronic kidney disease), stage III (HCC)    [name] 02/20/2016  . DVT (deep venous thrombosis) (HCC) 01/2016   a. RLE - Dx 01/2016 in [name] - Took Xarelto for 30 days - out x 2 wks.  [name]   . Hypertension   . Hypertensive heart disease with CHF (congestive heart failure) [facility])     Patient Active Problem List   Diagnosis Date Noted  . Systolic CHF, acute on chronic (HCC) 07/17/2018  . Hypertensive  emergency 06/18/2016  . H/O noncompliance with medical treatment, presenting hazards to health   . DVT (deep venous thrombosis) (HCC) 02/20/2016  . CKD (chronic kidney disease) stage 3, GFR 30-59 ml/min (HCC) 02/20/2016  . Accelerated hypertension 02/20/2016    Past Surgical History:  Procedure Laterality Date  . NO PAST SURGERIES          Home Medications    Prior to Admission medications   Medication Sig Start Date End Date Taking? Authorizing Provider  amLODipine (NORVASC) 10 MG tablet Take 1 tablet (10 mg total) by mouth daily. 01/27/17  Yes Bing Neighbors, FNP  carvedilol (COREG) 12.5 MG tablet Take 1 tablet (12.5 mg total) by mouth 2 (two) times daily with a meal. 01/27/17  Yes Bing Neighbors, FNP  furosemide (LASIX) 40 MG tablet Take 1 tablet (40 mg total) by mouth daily. 01/27/17  Yes Bing Neighbors, FNP  hydrALAZINE (APRESOLINE) 50 MG tablet Take 1 tablet (50 mg total) by mouth 3 (three) times daily. 01/27/17  Yes Bing Neighbors, FNP  hydrochlorothiazide (HYDRODIURIL) 25 MG tablet Take 1 tablet (25 mg total) by mouth daily. 01/22/17  Yes Mackuen, Courteney Lyn, MD  ibuprofen (ADVIL,MOTRIN) 600 MG tablet Take 600 mg by mouth 3 (three) times daily. 06/24/18  Yes [provider]  rivaroxaban (XARELTO) 20 MG TABS tablet Take 1 tablet (20 mg total)  by mouth daily with supper. 06/19/16  Yes Narda Bonds, MD  WARFARIN SODIUM PO Take 1 tablet by mouth daily.   Yes [provider]  HYDROcodone-acetaminophen (NORCO/VICODIN) 5-325 MG tablet Take 1 tablet by mouth every 6 (six) hours as needed. Patient not taking: Reported on 07/17/2018 02/08/17   Elpidio Anis, PA-C  lisinopril (PRINIVIL,ZESTRIL) 40 MG tablet Take 1 tablet (40 mg total) by mouth daily. Patient not taking: Reported on 07/17/2018 01/27/17   Bing Neighbors, FNP  potassium chloride SA (K-DUR,KLOR-CON) 20 MEQ tablet Take 1 tablet (20 mEq total) by mouth every other day. Patient not taking:  Reported on 07/17/2018 01/27/17   Bing Neighbors, FNP    Family History Family History  Problem Relation Age of Onset  . Asthma Mother        alive and well  . Hypertension Father        alive  . Lung cancer Father     Social History Social History   Tobacco Use  . Smoking status: Never Smoker  . Smokeless tobacco: Never Used  Substance Use Topics  . Alcohol use: No  . Drug use: No     Allergies   Patient has no known allergies.   Review of Systems Review of Systems  Respiratory: Positive for shortness of breath. Negative for cough and wheezing.   Cardiovascular: Positive for leg swelling. Negative for chest pain and palpitations.  Gastrointestinal: Positive for constipation. Negative for blood in stool, diarrhea, nausea and vomiting.  All other systems reviewed and are negative.    Physical Exam Updated Vital Signs BP (!) 168/108   Pulse (!) 103   Temp (!) 97.5 F (36.4 C) (Oral)   Resp 17   Ht 6' (1.829 m)   Wt 122.5 kg   SpO2 99%   BMI 36.62 kg/m   Physical Exam  Constitutional: He is oriented to person, place, and time. He appears well-developed and well-nourished. No distress.  HENT:  Head: Normocephalic and atraumatic.  Cardiovascular: Normal heart sounds.  No murmur heard. Irregularly irregular  Pulmonary/Chest: Effort normal and breath sounds normal. No respiratory distress. He has no wheezes.  Abdominal: Soft. He exhibits no distension. There is no tenderness.  Musculoskeletal:  Bilateral lower extremity edema, left greater than right.  Neurological: He is alert and oriented to person, place, and time.  Skin: Skin is warm and dry.  Nursing note and vitals reviewed.    ED Treatments / Results  Labs (all labs ordered are listed, but only abnormal results are displayed) Labs Reviewed  CBC WITH DIFFERENTIAL/PLATELET - Abnormal; Notable for the following components:      Result Value   Hemoglobin 12.3 (*)    MCH 24.3 (*)    MCHC 28.7  (*)    RDW 15.6 (*)    Eosinophils Absolute 0.8 (*)    All other components within normal limits  COMPREHENSIVE METABOLIC PANEL - Abnormal; Notable for the following components:   Potassium 3.4 (*)    BUN 26 (*)    Creatinine, Ser 2.05 (*)    Total Protein 6.3 (*)    GFR calc non Af Amer 37 (*)    GFR calc Af Amer 43 (*)    All other components within normal limits  PROTIME-INR - Abnormal; Notable for the following components:   Prothrombin Time 25.8 (*)    All other components within normal limits  BRAIN NATRIURETIC PEPTIDE - Abnormal; Notable for the following components:   B Natriuretic  Peptide 814.4 (*)    All other components within normal limits  I-STAT TROPONIN, ED - Abnormal; Notable for the following components:   Troponin i, poc 0.12 (*)    All other components within normal limits    EKG EKG Interpretation  Date/Time:  Saturday July 17 2018 16:35:04 EST Ventricular Rate:  122 PR Interval:    QRS Duration: 123 QT Interval:  326 QTC Calculation: 417 R Axis:   67 Text Interpretation:  Atrial fibrillation Multiple premature complexes, vent & supraven Consider right atrial enlargement Left bundle branch block Confirmed by Tilden Fossa [id number]) on 07/17/2018 5:07:30 PM   Radiology Dg Chest 2 View  Result Date: 07/17/2018 CLINICAL DATA:  Shortness of breath.  Bilateral ankle swelling. EXAM: CHEST - 2 VIEW COMPARISON:  06/17/2016 FINDINGS: Moderate cardiac enlargement. Pulmonary vascular congestion. No pleural effusion or overt edema. No airspace opacities. The visualized osseous structures are unremarkable. IMPRESSION: 1. Cardiac enlargement with pulmonary vascular congestion. Electronically Signed   By: Signa Kell M.D.   On: 07/17/2018 17:33    Procedures Procedures (including critical care time)  Medications Ordered in ED Medications  furosemide (LASIX) injection 60 mg (60 mg Intravenous Given 07/17/18 1844)  hydrALAZINE (APRESOLINE) tablet 50 mg (50  mg Oral Given 07/17/18 1843)     Initial Impression / Assessment and Plan / ED Course  I have reviewed the triage vital signs and the nursing notes.  Pertinent labs & imaging results that were available during my care of the patient were reviewed by me and considered in my medical decision making (see chart for details).    Cody [name] is a 47 y.o. male history of CHF, A. fib, hypertension, CKD and prior DVT who presents to ED for persistent shortness of breath over the last week.  Symptoms are worse with exertion and lying flat.  Associated with bilateral lower extremity swelling.  Chest x-ray with cardiomegaly and pulmonary vascular congestion without overt edema.  EKG with no signs of acute ischemic changes.  Does have positive troponin at 0.12.  Per chart review, it does appear that he has chronically elevated troponin's. He has not been experiencing any chest pain. LE ultrasound negative for DVT. BNP elevated. Sxs likely 2/2 acute exacerbation of his CHF. Given dose of IV lasix. Initially, patient reported compliance with home BP meds today, however on re-examination, states that he actually did not take home BP meds. Consulted hospitalist who will admit.    Patient seen by and discussed with Dr. Madilyn Hook who agrees with treatment plan.    Final Clinical Impressions(s) / ED Diagnoses   Final diagnoses:  Shortness of breath    ED Discharge Orders    None       , Chase Picket, PA-C 07/17/18 1900    Tilden Fossa, MD 07/19/18 1458

## 2018-07-17 NOTE — Progress Notes (Signed)
VASCULAR LAB PRELIMINARY  PRELIMINARY  PRELIMINARY  PRELIMINARY  Bilateral lower extremity venous duplex completed.    Preliminary report:  There is no DVT or SVT noted in the bilateral lower extremities.  Doppler waveforms are pulsatile suggestive of fluid overload.  Gave report to [facility], PA-C  Rickey Sadowski, RVT 07/17/2018, 6:19 PM

## 2018-07-18 DIAGNOSIS — Z9119 Patient's noncompliance with other medical treatment and regimen: Secondary | ICD-10-CM

## 2018-07-18 DIAGNOSIS — N183 Chronic kidney disease, stage 3 (moderate): Secondary | ICD-10-CM

## 2018-07-18 DIAGNOSIS — I10 Essential (primary) hypertension: Secondary | ICD-10-CM

## 2018-07-18 LAB — COMPREHENSIVE METABOLIC PANEL
ALT: 20 U/L (ref 0–44)
AST: 22 U/L (ref 15–41)
Albumin: 3.6 g/dL (ref 3.5–5.0)
Alkaline Phosphatase: 54 U/L (ref 38–126)
Anion gap: 13 (ref 5–15)
BUN: 26 mg/dL — ABNORMAL HIGH (ref 6–20)
CO2: 28 mmol/L (ref 22–32)
Calcium: 9.1 mg/dL (ref 8.9–10.3)
Chloride: 100 mmol/L (ref 98–111)
Creatinine, Ser: 1.9 mg/dL — ABNORMAL HIGH (ref 0.61–1.24)
GFR calc Af Amer: 48 mL/min — ABNORMAL LOW (ref >60)
GFR calc non Af Amer: 41 mL/min — ABNORMAL LOW (ref >60)
Glucose, Bld: 64 mg/dL — ABNORMAL LOW (ref 70–99)
Potassium: 3.5 mmol/L (ref 3.5–5.1)
Sodium: 141 mmol/L (ref 135–145)
Total Bilirubin: 1.1 mg/dL (ref 0.3–1.2)
Total Protein: 6.4 g/dL — ABNORMAL LOW (ref 6.5–8.1)

## 2018-07-18 LAB — HIV ANTIBODY (ROUTINE TESTING W REFLEX): HIV Screen 4th Generation wRfx: NONREACTIVE

## 2018-07-18 LAB — CBC WITH DIFFERENTIAL/PLATELET
Abs Immature Granulocytes: 0.02 10*3/uL (ref 0.00–0.07)
Basophils Absolute: 0.1 10*3/uL (ref 0.0–0.1)
Basophils Relative: 1 %
EOS PCT: 12 %
Eosinophils Absolute: 0.9 10*3/uL — ABNORMAL HIGH (ref 0.0–0.5)
HCT: 39.5 % (ref 39.0–52.0)
Hemoglobin: 11.8 g/dL — ABNORMAL LOW (ref 13.0–17.0)
Immature Granulocytes: 0 %
Lymphocytes Relative: 21 %
Lymphs Abs: 1.6 10*3/uL (ref 0.7–4.0)
MCH: 24.8 pg — ABNORMAL LOW (ref 26.0–34.0)
MCHC: 29.9 g/dL — AB (ref 30.0–36.0)
MCV: 83 fL (ref 80.0–100.0)
Monocytes Absolute: 0.8 10*3/uL (ref 0.1–1.0)
Monocytes Relative: 10 %
Neutro Abs: 4.1 10*3/uL (ref 1.7–7.7)
Neutrophils Relative %: 56 %
Platelets: 270 10*3/uL (ref 150–400)
RBC: 4.76 MIL/uL (ref 4.22–5.81)
RDW: 15.5 % (ref 11.5–15.5)
WBC: 7.4 10*3/uL (ref 4.0–10.5)
nRBC: 0 % (ref 0.0–0.2)

## 2018-07-18 MED ORDER — LISINOPRIL 40 MG PO TABS: 40.0000 mg | ORAL_TABLET | Freq: Every day | ORAL | 1 refills | Status: DC

## 2018-07-18 MED ORDER — CARVEDILOL 12.5 MG PO TABS
12.5000 mg | ORAL_TABLET | Freq: Two times a day (BID) | ORAL | Status: DC
  Administered 2018-07-18: 12.5 mg via ORAL
  Filled 2018-07-18: qty 1

## 2018-07-18 MED ORDER — POTASSIUM CHLORIDE CRYS ER 20 MEQ PO TBCR: 20.0000 meq | EXTENDED_RELEASE_TABLET | Freq: Two times a day (BID) | ORAL | 0 refills | Status: DC

## 2018-07-18 MED ORDER — APIXABAN 5 MG PO TABS: 5.0000 mg | ORAL_TABLET | Freq: Two times a day (BID) | ORAL | 0 refills | Status: DC

## 2018-07-18 MED ORDER — RIVAROXABAN 20 MG PO TABS: 20.0000 mg | ORAL_TABLET | Freq: Every day | ORAL | 0 refills | Status: DC

## 2018-07-18 MED ORDER — FUROSEMIDE 40 MG PO TABS: 40.0000 mg | ORAL_TABLET | Freq: Two times a day (BID) | ORAL | 0 refills | Status: DC

## 2018-07-18 MED ORDER — HYDRALAZINE HCL 50 MG PO TABS: 100.0000 mg | ORAL_TABLET | Freq: Three times a day (TID) | ORAL | 1 refills | Status: DC

## 2018-07-18 MED ORDER — CARVEDILOL 12.5 MG PO TABS: 12.5000 mg | ORAL_TABLET | Freq: Two times a day (BID) | ORAL | 1 refills | Status: DC

## 2018-07-18 NOTE — Care Management Note (Addendum)
Case Management Note  Patient Details  Name: JAYZION JANKIEWICZ MRN: [id number] Date of Birth: 06-26-1971  Subjective/Objective:                    Action/Plan:  Spoke w patient at bedside. He states that he has used [employer] card before, therefore he is not eligible for another 30 day card.  Patient was provided with [facility] letter, however this does not cover Xaralto. It will provide assistance with his other prescriptions. CM cannot make appointments on Sunday, offices are closed. Patient was provided with [facility] brochure to make appointment himself on Monday.  Resources such as [facility] and [facility] pharmacy will be available again on Monday. This information was provided to Dr Butler Denmark.  Patient will be DC'd w Eliquis, provided w 30 day card. He understands to call [facility] Monday to make appointment so he can secure his refill.   Expected Discharge Date:  07/18/18               Expected Discharge Plan:     In-House Referral:     Discharge planning Services  CM Consult, [facility], [facility]  Post Acute Care Choice:    Choice offered to:     DME Arranged:    DME Agency:     HH Arranged:    HH Agency:     Status of Service:  In process, will continue to follow  If discussed at Long Length of Stay Meetings, dates discussed:    Additional Comments:  Lawerance Sabal, RN 07/18/2018, 1:08 PM

## 2018-07-18 NOTE — Progress Notes (Signed)
Patient discharged from unit via pvt auto home. Patient alert and oriented and voices no c/o.  All personal belongings with patient. Discharge information reviewed with patient. Case manager spoke with patient regarding making follow up appts and keeping them as well as med compliance.   No distress noted. Patient advised about weighing self daily and recording and to notify MD if any increases noted.

## 2018-07-18 NOTE — Discharge Summary (Signed)
Physician Discharge Summary  Cody [name]:[id number] DOB: 1970-08-29 DOA: 07/17/2018  PCP: Patient, No Pcp Per  Admit date: 07/17/2018 Discharge date: 07/19/2018  Admitted From: home  Disposition:  home   Recommendations for Outpatient Follow-up:  1. Needs Bmet at next visit     Discharge Condition:  stable   CODE STATUS:  Full code  Consultations:  none    Discharge Diagnoses:  Principal Problem:   Systolic CHF, acute on chronic (HCC) Active Problems:   CKD (chronic kidney disease) stage 3, GFR 30-59 ml/min (HCC)   H/O noncompliance with medical treatment, presenting hazards to health   Hypertensive emergency      HPI (Dr Mikeal Hawthorne): Cody [name] is a 47 y.o. male with medical history significant of systolic dysfunction CHF with previous echocardiogram showing EF of 30%, hypertension, atrial fibrillation on chronic anticoagulation.  Medication noncompliance.  History of DVT on chronic kidney disease stage III who came to the ER complaining of progressive shortness of breath and cough.  Patient was taken Lasix but due to chronic kidney disease also on hydralazine.  He complained that the hydralazine was making his breathing worse.  He also has not been able to get most of his medications especially the Lasix.  He has therefore not been taking most of his medicines.  He has significant fluid overload and was noted to have evidence of worsening CHF.  He was given IV Lasix in the ER and is being admitted to the hospital with acute exacerbation of systolic dysfunction CHF.  Symptoms largely due to noncompliance due to his inability to get his medications.  Denied any chest pain, denied any orthopnea, he has PND.  Patient has previous history of DVT.  Initial evaluation now showed no evidence of DVT.[name]  Hospital Course:  Acute on chronic systolic CHF - feels much better today after IV lasix and diuresis - will re-prescibe all medications that he was supposed to be taking- he states he  will be able to get them from [facility] - he knows to f/ u with his cardiologist in [place] - note LE venous duplex done by ED negative yesteday  Essential HTN - home meds resumed   A-fib on chronic anticoagulation - rate controlled - he was prescribed Coumadin as he could not afford a DOAC but he stopped his Coumadin and does not recall his dose - currently I have no way of making a follow up appt for him to have an INR check (it is a Sunday). I have therefore, have not started Coumadin but given him a 30 day free card for Eliquis which he will be able to fill today. He previously received a 30 day free Xarelto card and cannot get another one this year  CKD 3 - stable  Morbid obesity - discussed weight loss Body mass index is 39.33 kg/m.   Anemia - likely of chronic disease- outpt work up   Discharge Exam: Vitals:   07/17/18 2236 07/18/18 0526  BP: (!) 167/110 (!) 158/101  Pulse: 97 (!) 104  Resp:  20  Temp:  97.8 F (36.6 C)  SpO2:  100%   Vitals:   07/17/18 2145 07/17/18 2236 07/18/18 0526 07/18/18 0537  BP: (!) 171/120 (!) 167/110 (!) 158/101   Pulse: (!) 113 97 (!) 104   Resp:   20   Temp:   97.8 F (36.6 C)   TempSrc:   Oral   SpO2:   100%   Weight:  131.5 kg  Height:        General: Pt is alert, awake, not in acute distress Cardiovascular: RRR, S1/S2 +, no rubs, no gallops Respiratory: CTA bilaterally, no wheezing, no rhonchi Abdominal: Soft, NT, ND, bowel sounds + Extremities: no edema, no cyanosis   Discharge Instructions  Discharge Instructions    (HEART FAILURE PATIENTS) Call MD:  Anytime you have any of the following symptoms: 1) 3 pound weight gain in 24 hours or 5 pounds in 1 week 2) shortness of breath, with or without a dry hacking cough 3) swelling in the hands, feet or stomach 4) if you have to sleep on extra pillows at night in order to breathe.   Complete by:  As directed    Diet - low sodium heart healthy   Complete by:  As directed     Diet - low sodium heart healthy   Complete by:  As directed    Increase activity slowly   Complete by:  As directed    Increase activity slowly   Complete by:  As directed      Allergies as of 07/18/2018   No Known Allergies     Medication List    STOP taking these medications   amLODipine 10 MG tablet Commonly known as:  NORVASC   hydrochlorothiazide 25 MG tablet Commonly known as:  HYDRODIURIL   HYDROcodone-acetaminophen 5-325 MG tablet Commonly known as:  NORCO/VICODIN   ibuprofen 600 MG tablet Commonly known as:  ADVIL,MOTRIN   rivaroxaban 20 MG Tabs tablet Commonly known as:  XARELTO   WARFARIN SODIUM PO     TAKE these medications   apixaban 5 MG Tabs tablet Commonly known as:  ELIQUIS Take 1 tablet (5 mg total) by mouth 2 (two) times daily.   carvedilol 12.5 MG tablet Commonly known as:  COREG Take 1 tablet (12.5 mg total) by mouth 2 (two) times daily with a meal.   furosemide 40 MG tablet Commonly known as:  LASIX Take 1 tablet (40 mg total) by mouth 2 (two) times daily. What changed:  when to take this   hydrALAZINE 50 MG tablet Commonly known as:  APRESOLINE Take 2 tablets (100 mg total) by mouth 3 (three) times daily. What changed:  how much to take   lisinopril 40 MG tablet Commonly known as:  PRINIVIL,ZESTRIL Take 1 tablet (40 mg total) by mouth daily.   potassium chloride SA 20 MEQ tablet Commonly known as:  K-DUR,KLOR-CON Take 1 tablet (20 mEq total) by mouth 2 (two) times daily. What changed:  when to take this       No Known Allergies   Procedures/Studies:    Dg Chest 2 View  Result Date: 07/17/2018 CLINICAL DATA:  Shortness of breath.  Bilateral ankle swelling. EXAM: CHEST - 2 VIEW COMPARISON:  06/17/2016 FINDINGS: Moderate cardiac enlargement. Pulmonary vascular congestion. No pleural effusion or overt edema. No airspace opacities. The visualized osseous structures are unremarkable. IMPRESSION: 1. Cardiac enlargement with  pulmonary vascular congestion. Electronically Signed   By: Signa Kell M.D.   On: 07/17/2018 17:33   Vas [place] Lower Extremity Venous (dvt) (only [facility])  Result Date: 07/18/2018  Lower Venous Study Indications: Pitting Edema.  Limitations: Body habitus and edema. Comparison Study: Prior negative study done 01/22/17 is available for                   comparison. Performing Technologist: Sherren Kerns RVS  Examination Guidelines: A complete evaluation includes B-mode imaging,  spectral Doppler, color Doppler, and power Doppler as needed of all accessible portions of each vessel. Bilateral testing is considered an integral part of a complete examination. Limited examinations for reoccurring indications may be performed as noted.  Right Venous Findings: +---------+---------------+---------+-----------+----------+---------+          CompressibilityPhasicitySpontaneityPropertiesSummary   +---------+---------------+---------+-----------+----------+---------+ CFV      Full                                         Pulsatile +---------+---------------+---------+-----------+----------+---------+ SFJ      Full                                                   +---------+---------------+---------+-----------+----------+---------+ FV Prox  Full                                                   +---------+---------------+---------+-----------+----------+---------+ FV Mid   Full                                                   +---------+---------------+---------+-----------+----------+---------+ FV DistalFull                                                   +---------+---------------+---------+-----------+----------+---------+ PFV      Full                                                   +---------+---------------+---------+-----------+----------+---------+ POP      Full                                         pulsatile  +---------+---------------+---------+-----------+----------+---------+ PTV      Full                                                   +---------+---------------+---------+-----------+----------+---------+ PERO     Full                                                   +---------+---------------+---------+-----------+----------+---------+  Right Technical Findings: Not visualized segments include not all segments of the peroneal vein.  Left Venous Findings: +---------+---------------+---------+-----------+----------+---------+          CompressibilityPhasicitySpontaneityPropertiesSummary   +---------+---------------+---------+-----------+----------+---------+ CFV      Full  pulsatile +---------+---------------+---------+-----------+----------+---------+ SFJ      Full                                                   +---------+---------------+---------+-----------+----------+---------+ FV Prox  Full                                                   +---------+---------------+---------+-----------+----------+---------+ FV Mid   Full                                                   +---------+---------------+---------+-----------+----------+---------+ FV DistalFull                                                   +---------+---------------+---------+-----------+----------+---------+ PFV      Full                                                   +---------+---------------+---------+-----------+----------+---------+ POP      Full                                         pulsatile +---------+---------------+---------+-----------+----------+---------+ PTV      Full                                                   +---------+---------------+---------+-----------+----------+---------+ PERO     Full                                                    +---------+---------------+---------+-----------+----------+---------+  Left Technical Findings: Not visualized segments include not all segments of the peroneal vein.   Summary: Right: There is no evidence of deep vein thrombosis in the lower extremity. Left: There is no evidence of deep vein thrombosis in the lower extremity.  *See table(s) above for measurements and observations. Electronically signed by Lemar Livings MD on 07/18/2018 at 12:04:04 PM.    Final      The results of significant diagnostics from this hospitalization (including imaging, microbiology, ancillary and laboratory) are listed below for reference.     Microbiology: No results found for this or any previous visit (from the past 240 hour(s)).   Labs: BNP (last 3 results) Recent Labs    07/17/18 1644  BNP 814.4*   Basic Metabolic Panel: Recent Labs  Lab 07/17/18 1644 07/18/18 0246  NA 142 141  K 3.4* 3.5  CL 104 100  CO2 28 28  GLUCOSE 72 64*  BUN 26* 26*  CREATININE 2.05* 1.90*  CALCIUM 9.2 9.1   Liver Function Tests: Recent Labs  Lab 07/17/18 1644 07/18/18 0246  AST 22 22  ALT 20 20  ALKPHOS 56 54  BILITOT 1.2 1.1  PROT 6.3* 6.4*  ALBUMIN 3.5 3.6   No results for input(s): LIPASE, AMYLASE in the last 168 hours. No results for input(s): AMMONIA in the last 168 hours. CBC: Recent Labs  Lab 07/17/18 1644 07/18/18 0246  WBC 6.3 7.4  NEUTROABS 3.6 4.1  HGB 12.3* 11.8*  HCT 42.8 39.5  MCV 84.4 83.0  PLT 292 270   Cardiac Enzymes: No results for input(s): CKTOTAL, CKMB, CKMBINDEX, TROPONINI in the last 168 hours. BNP: Invalid input(s): POCBNP CBG: No results for input(s): GLUCAP in the last 168 hours. D-Dimer No results for input(s): DDIMER in the last 72 hours. Hgb A1c No results for input(s): HGBA1C in the last 72 hours. Lipid Profile No results for input(s): CHOL, HDL, LDLCALC, TRIG, CHOLHDL, LDLDIRECT in the last 72 hours. Thyroid function studies No results for input(s): TSH,  T4TOTAL, T3FREE, THYROIDAB in the last 72 hours.  Invalid input(s): FREET3 Anemia work up No results for input(s): VITAMINB12, FOLATE, FERRITIN, TIBC, IRON, RETICCTPCT in the last 72 hours. Urinalysis    Component Value Date/Time   LABSPEC >=1.030 01/27/2017 1343   PHURINE 6.5 01/27/2017 1343   GLUCOSEU NEGATIVE 01/27/2017 1343   HGBUR TRACE (A) 01/27/2017 1343   BILIRUBINUR NEGATIVE 01/27/2017 1343   KETONESUR NEGATIVE 01/27/2017 1343   PROTEINUR >=300 (A) 01/27/2017 1343   UROBILINOGEN 2.0 (H) 01/27/2017 1343   NITRITE NEGATIVE 01/27/2017 1343   LEUKOCYTESUR NEGATIVE 01/27/2017 1343   Sepsis Labs Invalid input(s): PROCALCITONIN,  WBC,  LACTICIDVEN Microbiology No results found for this or any previous visit (from the past 240 hour(s)).   Time coordinating discharge in minutes: 60  SIGNED:   Calvert Cantor, MD  [facility] 07/19/2018, 5:37 PM Pager   If 7PM-7AM, please contact night-coverage [email or web address] Password [email or web address]

## 2018-07-18 NOTE — Discharge Instructions (Signed)
Take Eliquis instead of Xarelto- please see your doctor and have it or something similar re prescribed before it runs out.   You were cared for by a hospitalist during your hospital stay. If you have any questions about your discharge medications or the care you received while you were in the hospital after you are discharged, you can call the unit and asked to speak with the hospitalist on call if the hospitalist that took care of you is not available. Once you are discharged, your primary care physician will handle any further medical issues.   Please note that NO REFILLS for any discharge medications will be authorized once you are discharged, as it is imperative that you return to your primary care physician (or establish a relationship with a primary care physician if you do not have one) for your aftercare needs so that they can reassess your need for medications and monitor your lab values.  Please take all your medications with you for your next visit with your Primary MD. Please ask your Primary MD to get all Hospital records sent to his/her office. Please request your Primary MD to go over all hospital test results at the follow up.   If you experience worsening of your admission symptoms, develop shortness of breath, chest pain, suicidal or homicidal thoughts or a life threatening emergency, you must seek medical attention immediately by calling 911 or calling your MD.   Bonita Quin must read the complete instructions/literature along with all the possible adverse reactions/side effects for all the medicines you take including new medications that have been prescribed to you. Take new medicines after you have completely understood and accpet all the possible adverse reactions/side effects.    Do not drive when taking pain medications or sedatives.     Do not take more than prescribed Pain, Sleep and Anxiety Medications   If you have smoked or chewed Tobacco in the last 2 yrs please stop. Stop  any regular alcohol  and or recreational drug use.   Wear Seat belts while driving.

## 2018-07-18 NOTE — Plan of Care (Signed)
  Problem: Education: Goal: Knowledge of General Education information will improve Description Including pain rating scale, medication(s)/side effects and non-pharmacologic comfort measures Outcome: Progressing   Problem: Health Behavior/Discharge Planning: Goal: Ability to manage health-related needs will improve Outcome: Progressing   Problem: Clinical Measurements: Goal: Will remain free from infection Outcome: Progressing Goal: Cardiovascular complication will be avoided Outcome: Progressing   Problem: Nutrition: Goal: Adequate nutrition will be maintained Outcome: Progressing   Problem: Coping: Goal: Level of anxiety will decrease Outcome: Progressing   Problem: Elimination: Goal: Will not experience complications related to bowel motility Outcome: Progressing Goal: Will not experience complications related to urinary retention Outcome: Progressing   Problem: Pain Managment: Goal: General experience of comfort will improve Outcome: Progressing   Problem: Safety: Goal: Ability to remain free from injury will improve Outcome: Progressing   Problem: Skin Integrity: Goal: Risk for impaired skin integrity will decrease Outcome: Progressing   Problem: Clinical Measurements: Goal: Ability to maintain clinical measurements within normal limits will improve Outcome: Not Met (add Reason) Goal: Diagnostic test results will improve Outcome: Not Met (add Reason) Goal: Respiratory complications will improve Outcome: Not Met (add Reason)   Problem: Activity: Goal: Risk for activity intolerance will decrease Outcome: Not Met (add Reason)

## 2018-07-19 DIAGNOSIS — I10 Essential (primary) hypertension: Secondary | ICD-10-CM

## 2018-07-27 ENCOUNTER — Encounter: Payer: Self-pay | Admitting: Critical Care Medicine

## 2018-07-27 ENCOUNTER — Ambulatory Visit: Payer: Medicaid Other | Attending: Critical Care Medicine | Admitting: Critical Care Medicine

## 2018-07-27 ENCOUNTER — Other Ambulatory Visit: Payer: Self-pay

## 2018-07-27 VITALS — BP 175/125 | HR 94 | Temp 98.3°F | Resp 20 | Wt 286.0 lb

## 2018-07-27 DIAGNOSIS — R05 Cough: Secondary | ICD-10-CM | POA: Diagnosis not present

## 2018-07-27 DIAGNOSIS — Z1211 Encounter for screening for malignant neoplasm of colon: Secondary | ICD-10-CM | POA: Insufficient documentation

## 2018-07-27 DIAGNOSIS — G4733 Obstructive sleep apnea (adult) (pediatric): Secondary | ICD-10-CM | POA: Diagnosis not present

## 2018-07-27 DIAGNOSIS — Z7901 Long term (current) use of anticoagulants: Secondary | ICD-10-CM | POA: Diagnosis not present

## 2018-07-27 DIAGNOSIS — I5023 Acute on chronic systolic (congestive) heart failure: Secondary | ICD-10-CM | POA: Insufficient documentation

## 2018-07-27 DIAGNOSIS — K5900 Constipation, unspecified: Secondary | ICD-10-CM | POA: Diagnosis not present

## 2018-07-27 DIAGNOSIS — M25561 Pain in right knee: Secondary | ICD-10-CM | POA: Insufficient documentation

## 2018-07-27 DIAGNOSIS — R319 Hematuria, unspecified: Secondary | ICD-10-CM | POA: Insufficient documentation

## 2018-07-27 DIAGNOSIS — R195 Other fecal abnormalities: Secondary | ICD-10-CM | POA: Diagnosis not present

## 2018-07-27 DIAGNOSIS — I130 Hypertensive heart and chronic kidney disease with heart failure and stage 1 through stage 4 chronic kidney disease, or unspecified chronic kidney disease: Secondary | ICD-10-CM | POA: Diagnosis not present

## 2018-07-27 DIAGNOSIS — I10 Essential (primary) hypertension: Secondary | ICD-10-CM

## 2018-07-27 DIAGNOSIS — I4891 Unspecified atrial fibrillation: Secondary | ICD-10-CM | POA: Diagnosis not present

## 2018-07-27 DIAGNOSIS — Z86718 Personal history of other venous thrombosis and embolism: Secondary | ICD-10-CM | POA: Insufficient documentation

## 2018-07-27 DIAGNOSIS — Z9114 Patient's other noncompliance with medication regimen: Secondary | ICD-10-CM | POA: Insufficient documentation

## 2018-07-27 DIAGNOSIS — D631 Anemia in chronic kidney disease: Secondary | ICD-10-CM | POA: Diagnosis not present

## 2018-07-27 DIAGNOSIS — Z79899 Other long term (current) drug therapy: Secondary | ICD-10-CM | POA: Diagnosis not present

## 2018-07-27 DIAGNOSIS — N183 Chronic kidney disease, stage 3 unspecified: Secondary | ICD-10-CM

## 2018-07-27 DIAGNOSIS — Z9119 Patient's noncompliance with other medical treatment and regimen: Secondary | ICD-10-CM | POA: Diagnosis not present

## 2018-07-27 DIAGNOSIS — I5021 Acute systolic (congestive) heart failure: Secondary | ICD-10-CM | POA: Diagnosis present

## 2018-07-27 DIAGNOSIS — Z91199 Patient's noncompliance with other medical treatment and regimen due to unspecified reason: Secondary | ICD-10-CM

## 2018-07-27 DIAGNOSIS — Z91128 Patient's intentional underdosing of medication regimen for other reason: Secondary | ICD-10-CM | POA: Insufficient documentation

## 2018-07-27 LAB — POCT URINALYSIS DIP (CLINITEK)
BILIRUBIN UA: NEGATIVE
Glucose, UA: NEGATIVE mg/dL
Ketones, POC UA: NEGATIVE mg/dL
Leukocytes, UA: NEGATIVE
Nitrite, UA: NEGATIVE
POC PROTEIN,UA: 100 — AB
RBC UA: NEGATIVE
Spec Grav, UA: 1.015 (ref 1.010–1.025)
Urobilinogen, UA: 1 E.U./dL
pH, UA: 5 (ref 5.0–8.0)

## 2018-07-27 MED ORDER — CARVEDILOL 25 MG PO TABS: 25.0000 mg | ORAL_TABLET | Freq: Two times a day (BID) | ORAL | 6 refills | Status: DC

## 2018-07-27 MED ORDER — LISINOPRIL 40 MG PO TABS: 40.0000 mg | ORAL_TABLET | Freq: Every day | ORAL | 6 refills | Status: DC

## 2018-07-27 MED ORDER — POTASSIUM CHLORIDE CRYS ER 20 MEQ PO TBCR: 20.0000 meq | EXTENDED_RELEASE_TABLET | Freq: Two times a day (BID) | ORAL | 6 refills | Status: DC

## 2018-07-27 MED ORDER — HYDRALAZINE HCL 50 MG PO TABS: 50.0000 mg | ORAL_TABLET | Freq: Three times a day (TID) | ORAL | 6 refills | Status: DC

## 2018-07-27 MED ORDER — RIVAROXABAN 15 MG PO TABS: 15.0000 mg | ORAL_TABLET | Freq: Every day | ORAL | 6 refills | Status: DC

## 2018-07-27 MED ORDER — FUROSEMIDE 40 MG PO TABS: 40.0000 mg | ORAL_TABLET | Freq: Two times a day (BID) | ORAL | 6 refills | Status: DC

## 2018-07-27 MED ORDER — DOCUSATE SODIUM 50 MG PO CAPS: 50.0000 mg | ORAL_CAPSULE | Freq: Two times a day (BID) | ORAL | 6 refills | Status: DC

## 2018-07-27 MED FILL — FUROSEMIDE 40 MG TAB: 40 | 30 days supply | Qty: 60 | Fill #0

## 2018-07-27 MED FILL — POTASSIUM CL ER 20 MEQ TAB: 20 | 30 days supply | Qty: 60 | Fill #0

## 2018-07-27 MED FILL — hydrALAZINE HCL 50 MG TABS: 50 | 30 days supply | Qty: 90 | Fill #0

## 2018-07-27 MED FILL — XARELTO 15 MG TABLET: 15 | 30 days supply | Qty: 30 | Fill #0

## 2018-07-27 MED FILL — LISINOPRIL 40 MG TABLET: 40 | 30 days supply | Qty: 30 | Fill #0

## 2018-07-27 MED FILL — CARVEDILOL 25 MG TABLET: 25 | 30 days supply | Qty: 60 | Fill #0

## 2018-07-27 NOTE — Progress Notes (Addendum)
Subjective:    Patient ID: Cody [name], male    DOB: 1971-04-29, 47 y.o.   MRN: [id number]  47 y.o.M here for post hosp f/u for acute CHF.  The patient was admitted on November 30 and discharged on  December 2nd for acute heart failure   Past  medical history significant ofsystolic dysfunction CHF with previous echocardiogram showing EF of 30%, hypertension, atrial fibrillation on chronic anticoagulation. Medication noncompliance. History of DVT on chronic kidney disease stage III who came to the ER complaining of progressive shortness of breath and cough. Patient was taken Lasix but due to chronic kidney disease also on hydralazine. He complained that the hydralazine was making his breathing worse. He also has not been able to get most of his medications especially the Lasix. He has therefore not been taking most of his medicines. He has significant fluid overload and was noted to have evidence of worsening CHF. He was given IV Lasix in the ER and is being admitted to the hospital with acute exacerbation of systolic dysfunction CHF. Symptoms largely due to noncompliance due to his inability to get his medications. Denied any chest pain, denied any orthopnea, he has PND. Patient has previous history of DVT. Initial evaluation now showed no evidence of DVT..  07/27/2018 Since d/c notes hematuria with urination. Since d/c from the hospital    Congestive Heart Failure  Presents for follow-up (hx of CHF systolic dx 2 months, CKD is new) visit. Associated symptoms include chest pressure, fatigue, muscle weakness, near-syncope, orthopnea, palpitations, paroxysmal nocturnal dyspnea, shortness of breath and unexpected weight change. Pertinent negatives include no abdominal pain, chest pain, claudication, edema or nocturia. (Constipation is noted  Notes R sided knee pain Leg edema is better ) The symptoms have been worsening. Compliance problems include medication cost and adherence to diet  (not able to work on any jobs and so no dollars or med support).  Compliance with diet is 0-25%. Compliance with exercise is 0-25%. Compliance with medications is 76-100%.      Review of Systems  Constitutional: Positive for fatigue and unexpected weight change.  Respiratory: Positive for cough and shortness of breath. Negative for chest tightness and wheezing.   Cardiovascular: Positive for palpitations and near-syncope. Negative for chest pain, claudication and leg swelling.  Gastrointestinal: Positive for constipation. Negative for abdominal distention, abdominal pain, nausea and vomiting.       Last BM was 1 day ago  Genitourinary: Positive for hematuria. Negative for nocturia.  Musculoskeletal: Positive for muscle weakness.  Skin: Negative.   Neurological: Positive for weakness. Negative for dizziness, light-headedness and headaches.       Objective:   Physical Exam Vitals:   07/27/18 1432  BP: (!) 175/125  Pulse: 94  Resp: 20  Temp: 98.3 F (36.8 C)  TempSrc: Oral  SpO2: 98%  Weight: 286 lb (129.7 kg)    Gen: Pleasant, well-nourished, in no distress,  normal affect  ENT: No lesions,  mouth clear,  oropharynx clear, no postnasal drip  Neck: No JVD, no TMG, no carotid bruits  Lungs: No use of accessory muscles, no dullness to percussion, clear without rales or rhonchi  Cardiovascular: RRR, heart sounds normal, no murmur or gallops, no peripheral edema  Abdomen: soft and NT, no HSM,  BS normal  Musculoskeletal: No deformities, no cyanosis or clubbing  Neuro: alert, non focal  Skin: Warm, no lesions or rashes  Hemaccult Positive   No results found.  CXR 11/30: CM, mild edema  CBC Latest Ref Rng & Units 07/18/2018 07/17/2018 01/27/2017  WBC 4.0 - 10.5 K/uL 7.4 6.3 7.3  Hemoglobin 13.0 - 17.0 g/dL 11.8(L) 12.3(L) 16.9  Hematocrit 39.0 - 52.0 % 39.5 42.8 49.7  Platelets 150 - 400 K/uL 270 292 295   BMP Latest Ref Rng & Units 07/18/2018 07/17/2018 01/27/2017    Glucose 70 - 99 mg/dL 08(Q) 72 81  BUN 6 - 20 mg/dL 76(P) 95(K) 19  Creatinine 0.61 - 1.24 mg/dL 9.32(I) 7.12(W) 5.80(D)  Sodium 135 - 145 mmol/L 141 142 137  Potassium 3.5 - 5.1 mmol/L 3.5 3.4(L) 3.4(L)  Chloride 98 - 111 mmol/L 100 104 96(L)  CO2 22 - 32 mmol/L 28 28 27   Calcium 8.9 - 10.3 mg/dL 9.1 9.2 9.3   CrCl 48     Assessment & Plan:  I personally reviewed all images and lab data in the [facility] system as well as any outside material available during this office visit and agree with the  radiology impressions.   History of DVT (deep vein thrombosis) History of deep venous thrombosis right lower extremity now resolved  Now on Xarelto for atrial fibrillation  CKD (chronic kidney disease) stage 3, GFR 30-59 ml/min (HCC) Chronic kidney disease stage III Note this is due to chronic hypertension Note dosing of Xarelto will need to be adjusted  Plan Recheck bmet Reduce Xarelto to 15 mg daily  H/O noncompliance with medical treatment, presenting hazards to health The nonadherence issue is really more of an issue of lack of access to care and funding to afford medications as the patient has not been able to keep employment and has no insurance at this time  We will plan financial assistance and medication assistance  Systolic CHF, acute on chronic (HCC) Systolic heart failure was acute on chronic and is still decompensated with significant hypertension Ejection fraction was 30% Plan Referral to cardiology Continue Lasix 40 mg twice daily but with potassium supplement Continue hydralazine 50 mg 3 times daily Resume lisinopril at 40 mg daily Increase carvedilol to 25 mg twice daily Obtain BMP  Obstructive sleep apnea The patient has a previous diagnosis of sleep apnea the will need to be evaluated further  Uncontrolled hypertension Uncontrolled hypertension Review heart failure assessment  Heme positive stool Heme positive stool on exam likely from obstipation and  Xarelto increased dosage  Give patient a stool softener and obtain CBC and reduce Xarelto dose to 15 mg daily   Cody [name] was seen today for hospitalization follow-up.  Diagnoses and all orders for this visit:  Systolic CHF, acute on chronic (HCC) -     Basic metabolic panel; Future -     Ambulatory referral to Cardiology -     Basic metabolic panel  Uncontrolled hypertension  CKD (chronic kidney disease) stage 3, GFR 30-59 ml/min (HCC) -     Basic metabolic panel; Future -     POCT URINALYSIS DIP (CLINITEK) -     Basic metabolic panel  Anemia due to stage 3 chronic kidney disease (HCC) -     CBC with Differential/Platelet; Future -     CBC with Differential/Platelet  Encounter for Hemoccult screening -     Hemoccult - 1 Card (office)  History of DVT (deep vein thrombosis)  H/O noncompliance with medical treatment, presenting hazards to health  Obstructive sleep apnea  Heme positive stool  Other orders -     Rivaroxaban (XARELTO) 15 MG TABS tablet; Take 1 tablet (15 mg total) by mouth daily with  supper. -     potassium chloride SA (K-DUR,KLOR-CON) 20 MEQ tablet; Take 1 tablet (20 mEq total) by mouth 2 (two) times daily. -     lisinopril (PRINIVIL,ZESTRIL) 40 MG tablet; Take 1 tablet (40 mg total) by mouth daily. -     hydrALAZINE (APRESOLINE) 50 MG tablet; Take 1 tablet (50 mg total) by mouth 3 (three) times daily. -     furosemide (LASIX) 40 MG tablet; Take 1 tablet (40 mg total) by mouth 2 (two) times daily. -     carvedilol (COREG) 25 MG tablet; Take 1 tablet (25 mg total) by mouth 2 (two) times daily with a meal. -     docusate sodium (COLACE) 50 MG capsule; Take 1 capsule (50 mg total) by mouth 2 (two) times daily.

## 2018-07-27 NOTE — Assessment & Plan Note (Signed)
History of deep venous thrombosis right lower extremity now resolved  Now on Xarelto for atrial fibrillation

## 2018-07-27 NOTE — Progress Notes (Signed)
HFU- CHF, CKD, HTN  Always fatigue, SOB, feels full in stomach all the time  SOB feels worse: after showering he gets winded and walking short distances.   Needs RF on medication

## 2018-07-27 NOTE — Assessment & Plan Note (Signed)
The nonadherence issue is really more of an issue of lack of access to care and funding to afford medications as the patient has not been able to keep employment and has no insurance at this time  We will plan financial assistance and medication assistance

## 2018-07-27 NOTE — Assessment & Plan Note (Signed)
Systolic heart failure was acute on chronic and is still decompensated with significant hypertension Ejection fraction was 30% Plan Referral to cardiology Continue Lasix 40 mg twice daily but with potassium supplement Continue hydralazine 50 mg 3 times daily Resume lisinopril at 40 mg daily Increase carvedilol to 25 mg twice daily Obtain BMP

## 2018-07-27 NOTE — Assessment & Plan Note (Signed)
Chronic kidney disease stage III Note this is due to chronic hypertension Note dosing of Xarelto will need to be adjusted  Plan Recheck bmet Reduce Xarelto to 15 mg daily

## 2018-07-27 NOTE — Assessment & Plan Note (Signed)
The patient has a previous diagnosis of sleep apnea the will need to be evaluated further

## 2018-07-27 NOTE — Patient Instructions (Addendum)
Reduce Hydralazine to 50mg  three times daily Increase Coreg to 25mg  twice daily ( 2 12.5mg  tabs or 1  25mg  tab) Reduce Xarelto to 15mg  daily  Resume Lisinopril daily 40 mg Stay on lasix and potassium as your are taking Start Colace twice daily for your bowels  All medication refills sent to our pharmacy Labs today BMET, CBC, urinalysis  An appointment with Cardiology will be made  Return for recheck in 1 week with Dr Delford Field

## 2018-07-27 NOTE — Assessment & Plan Note (Signed)
Heme positive stool on exam likely from obstipation and Xarelto increased dosage  Give patient a stool softener and obtain CBC and reduce Xarelto dose to 15 mg daily

## 2018-07-27 NOTE — Assessment & Plan Note (Signed)
Uncontrolled hypertension Review heart failure assessment

## 2018-07-28 LAB — BASIC METABOLIC PANEL
BUN/Creatinine Ratio: 19 (ref 9–20)
BUN: 33 mg/dL — AB (ref 6–24)
CO2: 23 mmol/L (ref 20–29)
Calcium: 9.4 mg/dL (ref 8.7–10.2)
Chloride: 100 mmol/L (ref 96–106)
Creatinine, Ser: 1.74 mg/dL — ABNORMAL HIGH (ref 0.76–1.27)
GFR calc Af Amer: 53 mL/min/{1.73_m2} — ABNORMAL LOW (ref >59)
GFR calc non Af Amer: 46 mL/min/{1.73_m2} — ABNORMAL LOW (ref >59)
Glucose: 89 mg/dL (ref 65–99)
Potassium: 3.8 mmol/L (ref 3.5–5.2)
Sodium: 139 mmol/L (ref 134–144)

## 2018-07-28 LAB — CBC WITH DIFFERENTIAL/PLATELET
Basophils Absolute: 0.1 10*3/uL (ref 0.0–0.2)
Basos: 1 %
EOS (ABSOLUTE): 1.3 10*3/uL — AB (ref 0.0–0.4)
Eos: 18 %
Hematocrit: 35.1 % — ABNORMAL LOW (ref 37.5–51.0)
Hemoglobin: 11.2 g/dL — ABNORMAL LOW (ref 13.0–17.7)
Immature Grans (Abs): 0 10*3/uL (ref 0.0–0.1)
Immature Granulocytes: 0 %
Lymphocytes Absolute: 1.1 10*3/uL (ref 0.7–3.1)
Lymphs: 15 %
MCH: 25.5 pg — ABNORMAL LOW (ref 26.6–33.0)
MCHC: 31.9 g/dL (ref 31.5–35.7)
MCV: 80 fL (ref 79–97)
MONOS ABS: 0.8 10*3/uL (ref 0.1–0.9)
Monocytes: 10 %
Neutrophils Absolute: 4.2 10*3/uL (ref 1.4–7.0)
Neutrophils: 56 %
Platelets: 381 10*3/uL (ref 150–450)
RBC: 4.4 x10E6/uL (ref 4.14–5.80)
RDW: 13.8 % (ref 12.3–15.4)
WBC: 7.5 10*3/uL (ref 3.4–10.8)

## 2018-07-30 ENCOUNTER — Telehealth (INDEPENDENT_AMBULATORY_CARE_PROVIDER_SITE_OTHER): Payer: Self-pay

## 2018-07-30 ENCOUNTER — Encounter (INDEPENDENT_AMBULATORY_CARE_PROVIDER_SITE_OTHER): Payer: Self-pay

## 2018-07-30 NOTE — Telephone Encounter (Signed)
Left voicemail asking patient to return call to [facility] at [phone]. Results mailed as they are normal. Maryjean Morn, CMA

## 2018-07-30 NOTE — Telephone Encounter (Signed)
-----   Message from Storm Frisk, MD sent at 07/28/2018  2:56 PM EST ----- Eustace Pen, can you let the patient know his labs are all ok.  His kidney function is better, his blood counts are stable.  Potassium level ok.  No change in medications recommended

## 2018-08-02 ENCOUNTER — Ambulatory Visit: Payer: Medicaid Other | Attending: Critical Care Medicine | Admitting: Critical Care Medicine

## 2018-08-02 ENCOUNTER — Other Ambulatory Visit: Payer: Self-pay

## 2018-08-02 ENCOUNTER — Telehealth (INDEPENDENT_AMBULATORY_CARE_PROVIDER_SITE_OTHER): Payer: Self-pay

## 2018-08-02 ENCOUNTER — Encounter: Payer: Self-pay | Admitting: Critical Care Medicine

## 2018-08-02 VITALS — BP 154/89 | HR 89 | Temp 98.5°F | Resp 20 | Wt 289.0 lb

## 2018-08-02 DIAGNOSIS — I10 Essential (primary) hypertension: Secondary | ICD-10-CM

## 2018-08-02 DIAGNOSIS — N183 Chronic kidney disease, stage 3 unspecified: Secondary | ICD-10-CM

## 2018-08-02 DIAGNOSIS — Z8249 Family history of ischemic heart disease and other diseases of the circulatory system: Secondary | ICD-10-CM | POA: Diagnosis not present

## 2018-08-02 DIAGNOSIS — Z79899 Other long term (current) drug therapy: Secondary | ICD-10-CM | POA: Diagnosis not present

## 2018-08-02 DIAGNOSIS — Z7901 Long term (current) use of anticoagulants: Secondary | ICD-10-CM | POA: Insufficient documentation

## 2018-08-02 DIAGNOSIS — R195 Other fecal abnormalities: Secondary | ICD-10-CM | POA: Diagnosis not present

## 2018-08-02 DIAGNOSIS — I130 Hypertensive heart and chronic kidney disease with heart failure and stage 1 through stage 4 chronic kidney disease, or unspecified chronic kidney disease: Secondary | ICD-10-CM | POA: Diagnosis not present

## 2018-08-02 DIAGNOSIS — G4733 Obstructive sleep apnea (adult) (pediatric): Secondary | ICD-10-CM | POA: Diagnosis not present

## 2018-08-02 DIAGNOSIS — I5023 Acute on chronic systolic (congestive) heart failure: Secondary | ICD-10-CM | POA: Insufficient documentation

## 2018-08-02 DIAGNOSIS — Z86718 Personal history of other venous thrombosis and embolism: Secondary | ICD-10-CM | POA: Insufficient documentation

## 2018-08-02 DIAGNOSIS — I482 Chronic atrial fibrillation, unspecified: Secondary | ICD-10-CM | POA: Diagnosis not present

## 2018-08-02 MED ORDER — FUROSEMIDE 40 MG PO TABS: 80.0000 mg | ORAL_TABLET | Freq: Two times a day (BID) | ORAL | 6 refills | Status: DC

## 2018-08-02 MED ORDER — METOLAZONE 2.5 MG PO TABS: 2.5000 mg | ORAL_TABLET | Freq: Every day | ORAL | 6 refills | Status: DC

## 2018-08-02 MED FILL — metOLazone 2.5 MG TABS: 2.5 | 30 days supply | Qty: 30 | Fill #0

## 2018-08-02 NOTE — Assessment & Plan Note (Signed)
History of previous deep venous thrombosis which is stable at this time and no evidence of recurrence  Continue Xarelto at 15 mg daily

## 2018-08-02 NOTE — Assessment & Plan Note (Signed)
Hypertension is improved and needs further reduction by way of increasing diuretics

## 2018-08-02 NOTE — Telephone Encounter (Signed)
Attempt to call patient several times to redirect. No answer. Left message on voicemail to return call.

## 2018-08-02 NOTE — Progress Notes (Signed)
Still feeling lightheaded and dizzy with walking  And SOB.  Continues to take medications daily

## 2018-08-02 NOTE — Telephone Encounter (Signed)
Patient returned call and was informed that results are normal. Patient complained of still feeling short of breath, lightheaded and dizzy. He stated he was going to ED. Maryjean Morn, CMA

## 2018-08-02 NOTE — Telephone Encounter (Signed)
I can see him today if he will come here instead of ED

## 2018-08-02 NOTE — Assessment & Plan Note (Signed)
Chronic kidney disease stage III is stable at this time on repeat testing

## 2018-08-02 NOTE — Assessment & Plan Note (Signed)
Chronic atrial fibrillation rate is controlled with Coreg  We will continue also Xarelto

## 2018-08-02 NOTE — Assessment & Plan Note (Signed)
The patient does have sleep apnea and this will need to be evaluated again later

## 2018-08-02 NOTE — Progress Notes (Signed)
Subjective:    Patient ID: Cody [name], male    DOB: 08-07-1971, 47 y.o.   MRN: [id number]  47 y.o.M here for post hosp f/u for acute CHF.  The patient was admitted on November 30 and discharged on  December 2nd for acute heart failure   Past  medical history significant ofsystolic dysfunction CHF with previous 2017 echocardiogram showing EF of 55 - 60%, hypertension, atrial fibrillation on chronic anticoagulation. Medication noncompliance. History of DVT on chronic kidney disease stage III   The patient had been admitted between November 30 and December 2  08/02/2018 Since the office visit on 07/27/2018 the patient states his dyspnea is unchanged.  It is worse with exertion.  There is also associated dizziness with any walking.  He states when he swallows he feels as though food is sticking in the throat.  There is no cough.  There is no chest pain.  The patient denies any further hematuria.  The stools now are more regular and softer on Colace.  The patient's weight has increased. As below: Wt Readings from Last 3 Encounters: 08/02/18 : 289 lb (131.1 kg) 07/27/18 : 286 lb (129.7 kg) 07/18/18 : 290 lb (131.5 kg)  The patient has been compliant with the furosemide Coreg and hydralazine.  The patient also continues the Xarelto  The patient's last echocardiogram was in 2017 and showed an ejection fraction of 55 to 60%.  Patient does have documented chronic atrial fibrillation. Patient has not been seen by cardiology previously.    Congestive Heart Failure  Presents for follow-up (hx of CHF systolic dx 2 months, CKD is new) visit. Associated symptoms include chest pressure, edema, fatigue, muscle weakness, near-syncope, orthopnea, palpitations, paroxysmal nocturnal dyspnea, shortness of breath and unexpected weight change. Pertinent negatives include no abdominal pain, chest pain, claudication or nocturia. (Constipation is noted  Notes R sided knee pain Leg edema is better ) The  symptoms have been worsening. Compliance problems include medication cost and adherence to diet (not able to work on any jobs and so no dollars or med support).  Compliance with diet is 0-25%. Compliance with exercise is 0-25%. Compliance with medications is 76-100%.    Past Medical History:  Diagnosis Date  . Atrial fibrillation (HCC)   . CKD (chronic kidney disease), stage III (HCC)    Cody [name] 02/20/2016  . DVT (deep venous thrombosis) (HCC) 01/2016   a. RLE - Dx 01/2016 in [name] - Took Xarelto for 30 days - out x 2 wks.  [name]   . Hypertension   . Hypertensive heart disease with CHF (congestive heart failure) (HCC)      Family History  Problem Relation Age of Onset  . Asthma Mother        alive and well  . Hypertension Father        alive  . Lung cancer Father   . Cancer Father      Social History   Socioeconomic History  . Marital status: Single    Spouse name: Not on file  . Number of children: Not on file  . Years of education: Not on file  . Highest education level: Not on file  Occupational History  . Not on file  Social Needs  . Financial resource strain: Not on file  . Food insecurity:    Worry: Not on file    Inability: Not on file  . Transportation needs:    Medical: Not on file    Non-medical: Not on  file  Tobacco Use  . Smoking status: Never Smoker  . Smokeless tobacco: Never Used  Substance and Sexual Activity  . Alcohol use: No  . Drug use: No  . Sexual activity: Yes  Lifestyle  . Physical activity:    Days per week: Not on file    Minutes per session: Not on file  . Stress: Not on file  Relationships  . Social connections:    Talks on phone: Not on file    Gets together: Not on file    Attends religious service: Not on file    Active member of club or organization: Not on file    Attends meetings of clubs or organizations: Not on file    Relationship status: Not on file  . Intimate partner violence:    Fear of current or ex partner:  Not on file    Emotionally abused: Not on file    Physically abused: Not on file    Forced sexual activity: Not on file  Other Topics Concern  . Not on file  Social History Narrative   Lives in [place].  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     No Known Allergies   Outpatient Medications Prior to Visit  Medication Sig Dispense Refill  . carvedilol (COREG) 25 MG tablet Take 1 tablet (25 mg total) by mouth 2 (two) times daily with a meal. 60 tablet 6  . docusate sodium (COLACE) 50 MG capsule Take 1 capsule (50 mg total) by mouth 2 (two) times daily. 60 capsule 6  . hydrALAZINE (APRESOLINE) 50 MG tablet Take 1 tablet (50 mg total) by mouth 3 (three) times daily. 90 tablet 6  . lisinopril (PRINIVIL,ZESTRIL) 40 MG tablet Take 1 tablet (40 mg total) by mouth daily. 30 tablet 6  . potassium chloride SA (K-DUR,KLOR-CON) 20 MEQ tablet Take 1 tablet (20 mEq total) by mouth 2 (two) times daily. 60 tablet 6  . Rivaroxaban (XARELTO) 15 MG TABS tablet Take 1 tablet (15 mg total) by mouth daily with supper. 30 tablet 6  . furosemide (LASIX) 40 MG tablet Take 1 tablet (40 mg total) by mouth 2 (two) times daily. 60 tablet 6   No facility-administered medications prior to visit.      Review of Systems  Constitutional: Positive for fatigue and unexpected weight change.  Respiratory: Positive for cough and shortness of breath. Negative for chest tightness and wheezing.   Cardiovascular: Positive for palpitations and near-syncope. Negative for chest pain, claudication and leg swelling.  Gastrointestinal: Positive for constipation. Negative for abdominal distention, abdominal pain, nausea and vomiting.  Genitourinary: Positive for hematuria. Negative for nocturia.  Musculoskeletal: Positive for muscle weakness.  Skin: Negative.   Neurological: Positive for weakness. Negative for dizziness, light-headedness and headaches.       Objective:   Physical Exam  Vitals:    08/02/18 1131  BP: (!) 154/89  Pulse: 89  Resp: 20  Temp: 98.5 F (36.9 C)  TempSrc: Oral  SpO2: 98%  Weight: 289 lb (131.1 kg)    Gen: Pleasant, well-nourished, in no distress,  normal affect  ENT: No lesions,  mouth clear,  oropharynx clear, no postnasal drip  Neck: 2+ JVD, no TMG, no carotid bruits  Lungs: No use of accessory muscles, no dullness to percussion, distant BS  Cardiovascular:irreg irreg, heart sounds normal, no murmur or gallops, 4+  peripheral edema to pre tibial area  Abdomen: distended  ++ascites,  no HSM,  BS normal  Musculoskeletal: No deformities, no cyanosis or clubbing  Neuro: alert, non focal  Skin: Warm, no lesions or rashes     CXR 11/30: CM, mild edema  CBC Latest Ref Rng & Units 07/27/2018 07/18/2018 07/17/2018  WBC 3.4 - 10.8 x10E3/uL 7.5 7.4 6.3  Hemoglobin 13.0 - 17.7 g/dL 11.2(L) 11.8(L) 12.3(L)  Hematocrit 37.5 - 51.0 % 35.1(L) 39.5 42.8  Platelets 150 - 450 x10E3/uL 381 270 292   BMP Latest Ref Rng & Units 07/27/2018 07/18/2018 07/17/2018  Glucose 65 - 99 mg/dL 89 16(X) 72  BUN 6 - 24 mg/dL 09(U) 04(V) 40(J)  Creatinine 0.76 - 1.27 mg/dL 8.11(B) 1.47(W) 2.95(A)  BUN/Creat Ratio 9 - 20 19 - -  Sodium 134 - 144 mmol/L 139 141 142  Potassium 3.5 - 5.2 mmol/L 3.8 3.5 3.4(L)  Chloride 96 - 106 mmol/L 100 100 104  CO2 20 - 29 mmol/L 23 28 28   Calcium 8.7 - 10.2 mg/dL 9.4 9.1 9.2   CrCl  21/3 48 >> 12/10 52     Assessment & Plan:  I personally reviewed all images and lab data in the [facility] system as well as any outside material available during this office visit and agree with the  radiology impressions.   Systolic CHF, acute on chronic (HCC) Ongoing systolic heart failure with continued fluid retention Note echocardiogram has not been checked since 2017 and the patient has not been seen by cardiology  Plan Increase Lasix to 80 mg twice daily Add metolazone 2.5 milligrams daily Continue potassium supplementation Continue   hydralazine and Coreg at current doses Obtain repeat echocardiogram Referral to cardiology heart failure clinic Return to this clinic in 1 week  Uncontrolled hypertension Hypertension is improved and needs further reduction by way of increasing diuretics  Chronic atrial fibrillation Chronic atrial fibrillation rate is controlled with Coreg  We will continue also Xarelto  CKD (chronic kidney disease) stage 3, GFR 30-59 ml/min (HCC) Chronic kidney disease stage III is stable at this time on repeat testing  History of DVT (deep vein thrombosis) History of previous deep venous thrombosis which is stable at this time and no evidence of recurrence  Continue Xarelto at 15 mg daily  Heme positive stool Hemoccult positive stools with no change in hemoglobin  Continued observation  Obstructive sleep apnea The patient does have sleep apnea and this will need to be evaluated again later   Quaron was seen today for follow-up.  Diagnoses and all orders for this visit:  Systolic CHF, acute on chronic (HCC) -     AMB referral to CHF clinic -     ECHOCARDIOGRAM COMPLETE; Future  Chronic atrial fibrillation -     AMB referral to CHF clinic -     ECHOCARDIOGRAM COMPLETE; Future  Uncontrolled hypertension  CKD (chronic kidney disease) stage 3, GFR 30-59 ml/min (HCC)  History of DVT (deep vein thrombosis)  Heme positive stool  Obstructive sleep apnea  Other orders -     furosemide (LASIX) 40 MG tablet; Take 2 tablets (80 mg total) by mouth 2 (two) times daily. -     metolazone (ZAROXOLYN) 2.5 MG tablet; Take 1 tablet (2.5 mg total) by mouth daily.

## 2018-08-02 NOTE — Patient Instructions (Signed)
Increase furosemide to 80 mg twice daily  (2 40 mg tablets) Begin metolazone 2.5 mg 1 tablet daily All other medications are unchanged An echocardiogram will be obtained at [facility] A referral to cardiology at the heart failure clinic will be obtained Turn to see Dr. Delford Field in 1 week 08/09/2018

## 2018-08-02 NOTE — Assessment & Plan Note (Signed)
Hemoccult positive stools with no change in hemoglobin  Continued observation

## 2018-08-02 NOTE — Assessment & Plan Note (Addendum)
Ongoing systolic heart failure with continued fluid retention Note echocardiogram has not been checked since 2017 and the patient has not been seen by cardiology  Plan Increase Lasix to 80 mg twice daily Add metolazone 2.5 milligrams daily Continue potassium supplementation Continue  hydralazine and Coreg at current doses Obtain repeat echocardiogram Referral to cardiology heart failure clinic Return to this clinic in 1 week

## 2018-08-04 ENCOUNTER — Ambulatory Visit (HOSPITAL_COMMUNITY): Admission: RE | Admit: 2018-08-04 | Payer: Self-pay | Source: Ambulatory Visit

## 2018-08-09 ENCOUNTER — Ambulatory Visit: Payer: Self-pay | Admitting: Critical Care Medicine

## 2018-08-09 NOTE — Progress Notes (Deleted)
Subjective:    Patient ID: Cody SoursLamar K Woolworth, male    DOB: 1971/04/23, 47 y.o.   MRN: [id number]  47 y.o.M here for post hosp f/u for acute CHF.  The patient was admitted on November 30 and discharged on  December 2nd for acute heart failure   Past  medical history significant ofsystolic dysfunction CHF with previous 2017 echocardiogram showing EF of 55 - 60%, hypertension, atrial fibrillation on chronic anticoagulation. Medication noncompliance. History of DVT on chronic kidney disease stage III   The patient had been admitted between November 30 and December 2  08/02/2018 Since the office visit on 07/27/2018 the patient states his dyspnea is unchanged.  It is worse with exertion.  There is also associated dizziness with any walking.  He states when he swallows he feels as though food is sticking in the throat.  There is no cough.  There is no chest pain.  The patient denies any further hematuria.  The stools now are more regular and softer on Colace.  The patient's weight has increased. As below: Wt Readings from Last 3 Encounters: 08/02/18 : 289 lb (131.1 kg) 07/27/18 : 286 lb (129.7 kg) 07/18/18 : 290 lb (131.5 kg)  The patient has been compliant with the furosemide Coreg and hydralazine.  The patient also continues the Xarelto  The patient's last echocardiogram was in 2017 and showed an ejection fraction of 55 to 60%.  Patient does have documented chronic atrial fibrillation. Patient has not been seen by cardiology previously.  08/09/2018 Seen in f/u  At last OV 08/02/18  we increased lasix to 80mg  bid, added metolazone daily 2.5mg .  Pt was to obtaine Echo and CV CHF clinic appt. Needs BMP today.    Congestive Heart Failure  Presents for follow-up (hx of CHF systolic dx 2 months, CKD is new) visit. Associated symptoms include chest pressure, edema, fatigue, muscle weakness, near-syncope, orthopnea, palpitations, paroxysmal nocturnal dyspnea, shortness of breath and unexpected  weight change. Pertinent negatives include no abdominal pain, chest pain, claudication or nocturia. (Constipation is noted  Notes R sided knee pain Leg edema is better ) The symptoms have been worsening. Compliance problems include medication cost and adherence to diet (not able to work on any jobs and so no dollars or med support).  Compliance with diet is 0-25%. Compliance with exercise is 0-25%. Compliance with medications is 76-100%.    Past Medical History:  Diagnosis Date  . Atrial fibrillation (HCC)   . CKD (chronic kidney disease), stage III (HCC)    [name]/notes 02/20/2016  . DVT (deep venous thrombosis) (HCC) 01/2016   a. RLE - [place] [facility] - Took Xarelto for 30 days - out x 2 wks.  [name]. Dyspnea   . Hypertension   . Hypertensive heart disease with CHF (congestive heart failure) (HCC)      Family History  Problem Relation Age of Onset  . Asthma Mother        alive and well  . Hypertension Father        alive  . Lung cancer Father   . Cancer Father      Social History   Socioeconomic History  . Marital status: Single    Spouse name: Not on file  . Number of children: Not on file  . Years of education: Not on file  . Highest education level: Not on file  Occupational History  . Not on file  Social Needs  . Financial resource strain: Not on file  .  Food insecurity:    Worry: Not on file    Inability: Not on file  . Transportation needs:    Medical: Not on file    Non-medical: Not on file  Tobacco Use  . Smoking status: Never Smoker  . Smokeless tobacco: Never Used  Substance and Sexual Activity  . Alcohol use: No  . Drug use: No  . Sexual activity: Yes  Lifestyle  . Physical activity:    Days per week: Not on file    Minutes per session: Not on file  . Stress: Not on file  Relationships  . Social connections:    Talks on phone: Not on file    Gets together: Not on file    Attends religious service: Not on file    Active member of club or organization:  Not on file    Attends meetings of clubs or organizations: Not on file    Relationship status: Not on file  . Intimate partner violence:    Fear of current or ex partner: Not on file    Emotionally abused: Not on file    Physically abused: Not on file    Forced sexual activity: Not on file  Other Topics Concern  . Not on file  Social History Narrative   Lives in [place].  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     No Known Allergies   Outpatient Medications Prior to Visit  Medication Sig Dispense Refill  . carvedilol (COREG) 25 MG tablet Take 1 tablet (25 mg total) by mouth 2 (two) times daily with a meal. 60 tablet 6  . docusate sodium (COLACE) 50 MG capsule Take 1 capsule (50 mg total) by mouth 2 (two) times daily. 60 capsule 6  . furosemide (LASIX) 40 MG tablet Take 2 tablets (80 mg total) by mouth 2 (two) times daily. 120 tablet 6  . hydrALAZINE (APRESOLINE) 50 MG tablet Take 1 tablet (50 mg total) by mouth 3 (three) times daily. 90 tablet 6  . lisinopril (PRINIVIL,ZESTRIL) 40 MG tablet Take 1 tablet (40 mg total) by mouth daily. 30 tablet 6  . metolazone (ZAROXOLYN) 2.5 MG tablet Take 1 tablet (2.5 mg total) by mouth daily. 30 tablet 6  . potassium chloride SA (K-DUR,KLOR-CON) 20 MEQ tablet Take 1 tablet (20 mEq total) by mouth 2 (two) times daily. 60 tablet 6  . Rivaroxaban (XARELTO) 15 MG TABS tablet Take 1 tablet (15 mg total) by mouth daily with supper. 30 tablet 6   No facility-administered medications prior to visit.      Review of Systems  Constitutional: Positive for fatigue and unexpected weight change.  Respiratory: Positive for cough and shortness of breath. Negative for chest tightness and wheezing.   Cardiovascular: Positive for palpitations and near-syncope. Negative for chest pain, claudication and leg swelling.  Gastrointestinal: Positive for constipation. Negative for abdominal distention, abdominal pain, nausea and vomiting.    Genitourinary: Positive for hematuria. Negative for nocturia.  Musculoskeletal: Positive for muscle weakness.  Skin: Negative.   Neurological: Positive for weakness. Negative for dizziness, light-headedness and headaches.       Objective:   Physical Exam  There were no vitals filed for this visit.  Gen: Pleasant, well-nourished, in no distress,  normal affect  ENT: No lesions,  mouth clear,  oropharynx clear, no postnasal drip  Neck: 2+ JVD, no TMG, no carotid bruits  Lungs: No use of accessory muscles, no dullness to percussion, distant BS  Cardiovascular:irreg irreg, heart sounds normal, no murmur or gallops, 4+  peripheral edema to pre tibial area  Abdomen: distended  ++ascites,  no HSM,  BS normal  Musculoskeletal: No deformities, no cyanosis or clubbing  Neuro: alert, non focal  Skin: Warm, no lesions or rashes     CXR 11/30: CM, mild edema  CBC Latest Ref Rng & Units 07/27/2018 07/18/2018 07/17/2018  WBC 3.4 - 10.8 x10E3/uL 7.5 7.4 6.3  Hemoglobin 13.0 - 17.7 g/dL 11.2(L) 11.8(L) 12.3(L)  Hematocrit 37.5 - 51.0 % 35.1(L) 39.5 42.8  Platelets 150 - 450 x10E3/uL 381 270 292   BMP Latest Ref Rng & Units 07/27/2018 07/18/2018 07/17/2018  Glucose 65 - 99 mg/dL 89 16(X) 72  BUN 6 - 24 mg/dL 09(U) 04(V) 40(J)  Creatinine 0.76 - 1.27 mg/dL 8.11(B) 1.47(W) 2.95(A)  BUN/Creat Ratio 9 - 20 19 - -  Sodium 134 - 144 mmol/L 139 141 142  Potassium 3.5 - 5.2 mmol/L 3.8 3.5 3.4(L)  Chloride 96 - 106 mmol/L 100 100 104  CO2 20 - 29 mmol/L 23 28 28   Calcium 8.7 - 10.2 mg/dL 9.4 9.1 9.2   CrCl  21/3 48 >> 12/10 52     Assessment & Plan:  I personally reviewed all images and lab data in the [facility] system as well as any outside material available during this office visit and agree with the  radiology impressions.   No problem-specific Assessment & Plan notes found for this encounter.   There are no diagnoses linked to this encounter.

## 2018-08-17 ENCOUNTER — Ambulatory Visit (HOSPITAL_COMMUNITY): Payer: Self-pay | Attending: Critical Care Medicine

## 2018-09-07 ENCOUNTER — Emergency Department (HOSPITAL_COMMUNITY)
Admission: EM | Admit: 2018-09-07 | Discharge: 2018-09-07 | Disposition: A | Discharge status: Home / Self Care | Payer: Medicaid Other | Attending: Emergency Medicine | Admitting: Emergency Medicine

## 2018-09-07 ENCOUNTER — Other Ambulatory Visit: Payer: Self-pay

## 2018-09-07 ENCOUNTER — Emergency Department (HOSPITAL_COMMUNITY): Payer: Medicaid Other

## 2018-09-07 DIAGNOSIS — I5022 Chronic systolic (congestive) heart failure: Secondary | ICD-10-CM | POA: Insufficient documentation

## 2018-09-07 DIAGNOSIS — N289 Disorder of kidney and ureter, unspecified: Secondary | ICD-10-CM

## 2018-09-07 DIAGNOSIS — Z79899 Other long term (current) drug therapy: Secondary | ICD-10-CM | POA: Diagnosis not present

## 2018-09-07 DIAGNOSIS — N183 Chronic kidney disease, stage 3 (moderate): Secondary | ICD-10-CM | POA: Insufficient documentation

## 2018-09-07 DIAGNOSIS — I130 Hypertensive heart and chronic kidney disease with heart failure and stage 1 through stage 4 chronic kidney disease, or unspecified chronic kidney disease: Secondary | ICD-10-CM | POA: Insufficient documentation

## 2018-09-07 DIAGNOSIS — Z86718 Personal history of other venous thrombosis and embolism: Secondary | ICD-10-CM | POA: Insufficient documentation

## 2018-09-07 DIAGNOSIS — I4891 Unspecified atrial fibrillation: Secondary | ICD-10-CM | POA: Diagnosis not present

## 2018-09-07 DIAGNOSIS — D649 Anemia, unspecified: Secondary | ICD-10-CM | POA: Insufficient documentation

## 2018-09-07 DIAGNOSIS — Z7901 Long term (current) use of anticoagulants: Secondary | ICD-10-CM | POA: Diagnosis not present

## 2018-09-07 DIAGNOSIS — R002 Palpitations: Secondary | ICD-10-CM | POA: Diagnosis present

## 2018-09-07 LAB — BASIC METABOLIC PANEL
Anion gap: 11 (ref 5–15)
BUN: 31 mg/dL — ABNORMAL HIGH (ref 6–20)
CHLORIDE: 102 mmol/L (ref 98–111)
CO2: 27 mmol/L (ref 22–32)
Calcium: 9.7 mg/dL (ref 8.9–10.3)
Creatinine, Ser: 2.05 mg/dL — ABNORMAL HIGH (ref 0.61–1.24)
GFR calc Af Amer: 43 mL/min — ABNORMAL LOW (ref >60)
GFR calc non Af Amer: 37 mL/min — ABNORMAL LOW (ref >60)
Glucose, Bld: 94 mg/dL (ref 70–99)
POTASSIUM: 3.8 mmol/L (ref 3.5–5.1)
Sodium: 140 mmol/L (ref 135–145)

## 2018-09-07 LAB — CBC
HCT: 40.6 % (ref 39.0–52.0)
Hemoglobin: 12.5 g/dL — ABNORMAL LOW (ref 13.0–17.0)
MCH: 25 pg — ABNORMAL LOW (ref 26.0–34.0)
MCHC: 30.8 g/dL (ref 30.0–36.0)
MCV: 81.2 fL (ref 80.0–100.0)
Platelets: 311 10*3/uL (ref 150–400)
RBC: 5 MIL/uL (ref 4.22–5.81)
RDW: 16.5 % — ABNORMAL HIGH (ref 11.5–15.5)
WBC: 9.1 10*3/uL (ref 4.0–10.5)
nRBC: 0 % (ref 0.0–0.2)

## 2018-09-07 LAB — PROTIME-INR
INR: 1.32
Prothrombin Time: 16.2 seconds — ABNORMAL HIGH (ref 11.4–15.2)

## 2018-09-07 MED ORDER — LISINOPRIL 40 MG PO TABS: 40.0000 mg | ORAL_TABLET | Freq: Every day | ORAL | 0 refills | Status: DC

## 2018-09-07 MED ORDER — METOPROLOL TARTRATE 5 MG/5ML IV SOLN
5.0000 mg | Freq: Once | INTRAVENOUS | Status: AC
  Administered 2018-09-07: 5 mg via INTRAVENOUS
  Filled 2018-09-07: qty 5

## 2018-09-07 MED ORDER — SODIUM CHLORIDE 0.9% FLUSH: 3.0000 mL | Freq: Once | INTRAVENOUS | Status: DC

## 2018-09-07 MED ORDER — LISINOPRIL 20 MG PO TABS
40.0000 mg | ORAL_TABLET | Freq: Once | ORAL | Status: AC
  Administered 2018-09-07: 40 mg via ORAL
  Filled 2018-09-07: qty 2

## 2018-09-07 MED ORDER — CARVEDILOL 25 MG PO TABS: 25.0000 mg | ORAL_TABLET | Freq: Two times a day (BID) | ORAL | 0 refills | Status: DC

## 2018-09-07 MED ORDER — CARVEDILOL 12.5 MG PO TABS
25.0000 mg | ORAL_TABLET | Freq: Once | ORAL | Status: AC
  Administered 2018-09-07: 25 mg via ORAL
  Filled 2018-09-07: qty 2

## 2018-09-07 NOTE — Discharge Instructions (Addendum)
Do not miss any doses of your carvedilol.

## 2018-09-07 NOTE — ED Notes (Signed)
Pt discharged from ED; instructions provided and scripts given; Pt encouraged to return to ED if symptoms worsen and to f/u with PCP; Pt verbalized understanding of all instructions 

## 2018-09-07 NOTE — ED Provider Notes (Signed)
[facility] EMERGENCY DEPARTMENT Provider Note   CSN: [id number] Arrival date & time: 09/07/18  0200     History   Chief Complaint Chief Complaint  Patient presents with  . Palpitations    HPI Cody [name] is a 48 y.o. male.  The history is provided by the patient.  Palpitations  He has history of chronic atrial fibrillation, chronic kidney disease, hypertension, medication noncompliance comes in complaining that he feels like his heart is racing and is irregular.  This happened this morning and has persisted through the day.  He feels generally weak but denies chest pain, heaviness, tightness, or pressure.  He denies dyspnea.  He denies nausea or vomiting.  He states that he had run out of his blood pressure medicines of carvedilol and lisinopril with last dose having been taken January 19.  He states he has not missed any doses of his rivaroxaban.  Past Medical History:  Diagnosis Date  . Atrial fibrillation (HCC)   . CKD (chronic kidney disease), stage III (HCC)    [name] 02/20/2016  . DVT (deep venous thrombosis) (HCC) 01/2016   a. RLE - Dx 01/2016 in [name] - Took Xarelto for 30 days - out x 2 wks.  [name]   . Hypertension   . Hypertensive heart disease with CHF (congestive heart failure) [facility])     Patient Active Problem List   Diagnosis Date Noted  . Chronic atrial fibrillation 08/02/2018  . History of DVT (deep vein thrombosis) 07/27/2018  . Anemia due to stage 3 chronic kidney disease (HCC) 07/27/2018  . Heme positive stool 07/27/2018  . Uncontrolled hypertension 07/19/2018  . Systolic CHF, acute on chronic (HCC) 07/17/2018  . Obstructive sleep apnea 12/11/2016  . H/O noncompliance with medical treatment, presenting hazards to health   . CKD (chronic kidney disease) stage 3, GFR 30-59 ml/min (HCC) 02/20/2016    Past Surgical History:  Procedure Laterality Date  . NO PAST SURGERIES          Home Medications    Prior to Admission  medications   Medication Sig Start Date End Date Taking? Authorizing Provider  carvedilol (COREG) 25 MG tablet Take 1 tablet (25 mg total) by mouth 2 (two) times daily with a meal. 07/27/18   Storm Frisk, MD  docusate sodium (COLACE) 50 MG capsule Take 1 capsule (50 mg total) by mouth 2 (two) times daily. 07/27/18   Storm Frisk, MD  furosemide (LASIX) 40 MG tablet Take 2 tablets (80 mg total) by mouth 2 (two) times daily. 08/02/18   Storm Frisk, MD  hydrALAZINE (APRESOLINE) 50 MG tablet Take 1 tablet (50 mg total) by mouth 3 (three) times daily. 07/27/18   Storm Frisk, MD  lisinopril (PRINIVIL,ZESTRIL) 40 MG tablet Take 1 tablet (40 mg total) by mouth daily. 07/27/18   Storm Frisk, MD  metolazone (ZAROXOLYN) 2.5 MG tablet Take 1 tablet (2.5 mg total) by mouth daily. 08/02/18   Storm Frisk, MD  potassium chloride SA (K-DUR,KLOR-CON) 20 MEQ tablet Take 1 tablet (20 mEq total) by mouth 2 (two) times daily. 07/27/18   Storm Frisk, MD  Rivaroxaban (XARELTO) 15 MG TABS tablet Take 1 tablet (15 mg total) by mouth daily with supper. 07/27/18   Storm Frisk, MD    Family History Family History  Problem Relation Age of Onset  . Asthma Mother        alive and well  . Hypertension Father  alive  . Lung cancer Father   . Cancer Father     Social History Social History   Tobacco Use  . Smoking status: Never Smoker  . Smokeless tobacco: Never Used  Substance Use Topics  . Alcohol use: No  . Drug use: No     Allergies   Patient has no known allergies.   Review of Systems Review of Systems  Cardiovascular: Positive for palpitations.  All other systems reviewed and are negative.    Physical Exam Updated Vital Signs BP (!) 175/150   Pulse (!) 127   Temp (!) 97.5 F (36.4 C) (Oral)   Resp 18   Ht 6' (1.829 m)   Wt 117.9 kg   SpO2 98%   BMI 35.26 kg/m   Physical Exam Vitals signs and nursing note reviewed.    48 year old  male, resting comfortably and in no acute distress. Vital signs are significant for elevated heart rate and blood pressure. Oxygen saturation is 98%, which is normal. Head is normocephalic and atraumatic. PERRLA, EOMI. Oropharynx is clear. Neck is nontender and supple without adenopathy or JVD. Back is nontender and there is no CVA tenderness. Lungs are clear without rales, wheezes, or rhonchi. Chest is nontender. Heart is tachycardic and irregular without murmur. Abdomen is soft, flat, nontender without masses or hepatosplenomegaly and peristalsis is normoactive. Extremities have trace edema, full range of motion is present. Skin is warm and dry without rash. Neurologic: Mental status is normal, cranial nerves are intact, there are no motor or sensory deficits.  ED Treatments / Results  Labs (all labs ordered are listed, but only abnormal results are displayed) Labs Reviewed  BASIC METABOLIC PANEL - Abnormal; Notable for the following components:      Result Value   BUN 31 (*)    Creatinine, Ser 2.05 (*)    GFR calc non Af Amer 37 (*)    GFR calc Af Amer 43 (*)    All other components within normal limits  CBC - Abnormal; Notable for the following components:   Hemoglobin 12.5 (*)    MCH 25.0 (*)    RDW 16.5 (*)    All other components within normal limits  PROTIME-INR - Abnormal; Notable for the following components:   Prothrombin Time 16.2 (*)    All other components within normal limits    EKG EKG Interpretation  Date/Time:  Tuesday September 07 2018 02:10:02 EST Ventricular Rate:  137 PR Interval:    QRS Duration: 121 QT Interval:  304 QTC Calculation: 432 R Axis:   69 Text Interpretation:  Atrial fibrillation with rapid ventricular response LVH with secondary repolarization abnormality Baseline wander in lead(s) V1 When compared with ECG of 08/02/2018, HEART RATE has increased Confirmed by Dione Booze ([id number]) on 09/07/2018 2:12:47 AM   Radiology Dg Chest 2  View  Result Date: 09/07/2018 CLINICAL DATA:  Palpitation EXAM: CHEST - 2 VIEW COMPARISON:  07/17/2018, 10/31/2016 FINDINGS: Cardiomegaly with central vascular congestion. No consolidation or effusion. No pneumothorax. IMPRESSION: Cardiomegaly with central congestion Electronically Signed   By: Jasmine Pang M.D.   On: 09/07/2018 02:48    Procedures Procedures  CRITICAL CARE Performed by: Dione Booze Total critical care time: 35 minutes Critical care time was exclusive of separately billable procedures and treating other patients. Critical care was necessary to treat or prevent imminent or life-threatening deterioration. Critical care was time spent personally by me on the following activities: development of treatment plan with patient and/or surrogate as  well as nursing, discussions with consultants, evaluation of patient's response to treatment, examination of patient, obtaining history from patient or surrogate, ordering and performing treatments and interventions, ordering and review of laboratory studies, ordering and review of radiographic studies, pulse oximetry and re-evaluation of patient's condition.  Medications Ordered in ED Medications  sodium chloride flush (NS) 0.9 % injection 3 mL (has no administration in time range)  lisinopril (PRINIVIL,ZESTRIL) tablet 40 mg (has no administration in time range)  carvedilol (COREG) tablet 25 mg (has no administration in time range)  metoprolol tartrate (LOPRESSOR) injection 5 mg (has no administration in time range)     Initial Impression / Assessment and Plan / ED Course  I have reviewed the triage vital signs and the nursing notes.  Pertinent labs & imaging results that were available during my care of the patient were reviewed by me and considered in my medical decision making (see chart for details).  Atrial fibrillation with rapid ventricular response.  ECG shows heart rate increase over most recent ECG.  This is likely secondary  to his not taking his beta-blocker.  He will be given a dose of IV metoprolol, also given his dose of carvedilol and lisinopril.  Old records are reviewed confirming chronic atrial fibrillation along with chronic systolic heart failure.  Following above-noted treatment, heart rate is come down to the upper 90s.  Labs show stable anemia and stable renal insufficiency.  Chest x-ray shows no acute process.  He is given a prescription for 1 month supply of lisinopril and carvedilol.  During that time, he is to contact his primary care provider to arrange for ongoing prescriptions.  Advised to never miss a dose of the carvedilol.  CHA2DS2/VAS Stroke Risk Points  Current as of 5 minutes ago     2 >= 2 Points: High Risk  1 - 1.99 Points: Medium Risk  0 Points: Low Risk    The patient's score has not changed in the past year.:  No Change     Details    This score determines the patient's risk of having a stroke if the  patient has atrial fibrillation.       Points Metrics  1 Has Congestive Heart Failure:  Yes    Current as of 5 minutes ago  0 Has Vascular Disease:  No    Current as of 5 minutes ago  1 Has Hypertension:  Yes    Current as of 5 minutes ago  0 Age:  21    Current as of 5 minutes ago  0 Has Diabetes:  No    Current as of 5 minutes ago  0 Had Stroke:  No  Had TIA:  No  Had thromboembolism:  No    Current as of 5 minutes ago  0 Male:  No    Current as of 5 minutes ago     Final Clinical Impressions(s) / ED Diagnoses   Final diagnoses:  None    ED Discharge Orders    None       Dione Booze, MD 09/07/18 [phone]

## 2018-09-07 NOTE — ED Triage Notes (Signed)
Patient c/o palpitations that began earlier today. Also c/o SOB.

## 2018-09-24 ENCOUNTER — Telehealth: Payer: Self-pay | Admitting: Critical Care Medicine

## 2018-09-24 MED FILL — LISINOPRIL 40 MG TABLET: 40 | 30 days supply | Qty: 30 | Fill #1

## 2018-09-24 MED FILL — FUROSEMIDE 40 MG TAB: 40 | 30 days supply | Qty: 60 | Fill #1

## 2018-09-24 MED FILL — CARVEDILOL 25 MG TABLET: 25 | 30 days supply | Qty: 60 | Fill #1

## 2018-09-24 NOTE — Telephone Encounter (Signed)
1) Medication(s) Requested (by name): Lisinopril and Carvedilol   2) Pharmacy of Choice: [facility]  3) Special Requests:   Approved medications will be sent to the pharmacy, we will reach out if there is an issue.  Requests made after 3pm may not be addressed until the following business day!  If a patient is unsure of the name of the medication(s) please note and ask patient to call back when they are able to provide all info, do not send to responsible party until all information is available!

## 2018-09-24 NOTE — Telephone Encounter (Signed)
Pt had rx's sent to [facility] on 09/07/18, I will have the pharmacy transfer the prescriptions. He needs to make an appointment with a provider to establish care and be seen to receive further refills.

## 2018-10-07 ENCOUNTER — Emergency Department (HOSPITAL_COMMUNITY)
Admission: EM | Admit: 2018-10-07 | Discharge: 2018-10-07 | Disposition: A | Discharge status: Home / Self Care | Payer: Medicaid Other | Attending: Emergency Medicine | Admitting: Emergency Medicine

## 2018-10-07 ENCOUNTER — Other Ambulatory Visit: Payer: Self-pay

## 2018-10-07 ENCOUNTER — Encounter (HOSPITAL_COMMUNITY): Payer: Self-pay

## 2018-10-07 ENCOUNTER — Emergency Department (HOSPITAL_COMMUNITY): Payer: Medicaid Other

## 2018-10-07 DIAGNOSIS — N183 Chronic kidney disease, stage 3 (moderate): Secondary | ICD-10-CM | POA: Insufficient documentation

## 2018-10-07 DIAGNOSIS — M25561 Pain in right knee: Secondary | ICD-10-CM | POA: Diagnosis present

## 2018-10-07 DIAGNOSIS — Z7901 Long term (current) use of anticoagulants: Secondary | ICD-10-CM | POA: Diagnosis not present

## 2018-10-07 DIAGNOSIS — I130 Hypertensive heart and chronic kidney disease with heart failure and stage 1 through stage 4 chronic kidney disease, or unspecified chronic kidney disease: Secondary | ICD-10-CM | POA: Diagnosis not present

## 2018-10-07 DIAGNOSIS — I5022 Chronic systolic (congestive) heart failure: Secondary | ICD-10-CM | POA: Diagnosis not present

## 2018-10-07 DIAGNOSIS — M25461 Effusion, right knee: Secondary | ICD-10-CM | POA: Diagnosis not present

## 2018-10-07 DIAGNOSIS — Z79899 Other long term (current) drug therapy: Secondary | ICD-10-CM | POA: Insufficient documentation

## 2018-10-07 MED ORDER — HYDROCODONE-ACETAMINOPHEN 5-325 MG PO TABS: 1.0000 | ORAL_TABLET | Freq: Four times a day (QID) | ORAL | 0 refills | Status: DC | PRN

## 2018-10-07 MED ORDER — OXYCODONE-ACETAMINOPHEN 5-325 MG PO TABS
1.0000 | ORAL_TABLET | Freq: Once | ORAL | Status: AC
  Administered 2018-10-07: 1 via ORAL
  Filled 2018-10-07: qty 1

## 2018-10-07 NOTE — ED Notes (Signed)
Ortho Tech paiged.

## 2018-10-07 NOTE — ED Provider Notes (Signed)
Medical screening examination/treatment/procedure(s) were conducted as a shared visit with non-physician practitioner(s) and myself.  I personally evaluated the patient during the encounter.  None 48 year old male presents with large swelling of the right knee.  No known injury.  On Xarelto.  Pain increased with movement.  No weakness numbness.  Patient has moderately large effusion.  No warmth or erythema.  Lower leg without swelling or tenderness.  Consistent with nontraumatic effusion.  Agree with plan of management.   Arby Barrette, MD 10/09/18 [phone]

## 2018-10-07 NOTE — ED Triage Notes (Signed)
Pt endorses right knee pain that began yesterday, denies injury. Moderate amount of swelling noted. CMS intact. VSS.

## 2018-10-07 NOTE — Discharge Instructions (Addendum)
Please call and follow up closely with orthopedist today/tomorrow for further management of your knee swelling.

## 2018-10-07 NOTE — ED Notes (Signed)
Patient transported to X-ray 

## 2018-10-07 NOTE — ED Notes (Signed)
Pt not in treatment room.

## 2018-10-07 NOTE — ED Provider Notes (Signed)
[facility] EMERGENCY DEPARTMENT Provider Note   CSN: [id number] Arrival date & time: 10/07/18  1540    History   Chief Complaint Chief Complaint  Patient presents with  . Knee Pain    HPI Cody [name] is a 48 y.o. male.     The history is provided by the patient and medical records. No language interpreter was used.  Knee Pain  Associated symptoms: no fever      48 year old male with history of DVT, atrial fibrillation currently on Xarelto, CKD, CHF presenting for evaluation of right knee pain.  Patient reports atraumatic right knee pain and swelling ongoing over the past 3 days.  Pain is described as a tightness throbbing sensation, worsening when he initiates movement and with walking.  Rates pain as a of 10, nonradiating.  Increasing pain with flexion of the knee.  No pain to the hip or the ankle no numbness or weakness or rash.  He denies any injury, no fever or chills.  He is currently taking Xarelto for atrial fibrillation.  Report prior history of DVT many years in the past but denies any calf pain, chest pain or trouble breathing.  Past Medical History:  Diagnosis Date  . Atrial fibrillation (HCC)   . CKD (chronic kidney disease), stage III (HCC)    [name] 02/20/2016  . DVT (deep venous thrombosis) (HCC) 01/2016   a. RLE - Dx 01/2016 in [name] - Took Xarelto for 30 days - out x 2 wks.  [name]   . Hypertension   . Hypertensive heart disease with CHF (congestive heart failure) [facility])     Patient Active Problem List   Diagnosis Date Noted  . Chronic atrial fibrillation 08/02/2018  . History of DVT (deep vein thrombosis) 07/27/2018  . Anemia due to stage 3 chronic kidney disease (HCC) 07/27/2018  . Heme positive stool 07/27/2018  . Uncontrolled hypertension 07/19/2018  . Systolic CHF, acute on chronic (HCC) 07/17/2018  . Obstructive sleep apnea 12/11/2016  . H/O noncompliance with medical treatment, presenting hazards to health   . CKD (chronic  kidney disease) stage 3, GFR 30-59 ml/min (HCC) 02/20/2016    Past Surgical History:  Procedure Laterality Date  . NO PAST SURGERIES          Home Medications    Prior to Admission medications   Medication Sig Start Date End Date Taking? Authorizing Provider  carvedilol (COREG) 25 MG tablet Take 1 tablet (25 mg total) by mouth 2 (two) times daily with a meal. 09/07/18   Dione Booze, MD  docusate sodium (COLACE) 50 MG capsule Take 1 capsule (50 mg total) by mouth 2 (two) times daily. 07/27/18   Storm Frisk, MD  furosemide (LASIX) 40 MG tablet Take 2 tablets (80 mg total) by mouth 2 (two) times daily. 08/02/18   Storm Frisk, MD  hydrALAZINE (APRESOLINE) 50 MG tablet Take 1 tablet (50 mg total) by mouth 3 (three) times daily. 07/27/18   Storm Frisk, MD  lisinopril (PRINIVIL,ZESTRIL) 40 MG tablet Take 1 tablet (40 mg total) by mouth daily. 09/07/18   Dione Booze, MD  metolazone (ZAROXOLYN) 2.5 MG tablet Take 1 tablet (2.5 mg total) by mouth daily. 08/02/18   Storm Frisk, MD  potassium chloride SA (K-DUR,KLOR-CON) 20 MEQ tablet Take 1 tablet (20 mEq total) by mouth 2 (two) times daily. 07/27/18   Storm Frisk, MD  Rivaroxaban (XARELTO) 15 MG TABS tablet Take 1 tablet (15 mg total) by  mouth daily with supper. 07/27/18   Storm Frisk, MD    Family History Family History  Problem Relation Age of Onset  . Asthma Mother        alive and well  . Hypertension Father        alive  . Lung cancer Father   . Cancer Father     Social History Social History   Tobacco Use  . Smoking status: Never Smoker  . Smokeless tobacco: Never Used  Substance Use Topics  . Alcohol use: No  . Drug use: No     Allergies   Patient has no known allergies.   Review of Systems Review of Systems  Constitutional: Negative for fever.  Musculoskeletal: Positive for joint swelling.  Skin: Negative for rash and wound.     Physical Exam Updated Vital Signs BP (!)  150/118 (BP Location: Right Arm)   Pulse 89   Temp (!) 97.5 F (36.4 C)   Resp 18   SpO2 100%   Physical Exam Vitals signs and nursing note reviewed.  Constitutional:      General: He is not in acute distress.    Appearance: He is well-developed.  HENT:     Head: Atraumatic.  Eyes:     Conjunctiva/sclera: Conjunctivae normal.  Neck:     Musculoskeletal: Neck supple.  Musculoskeletal:        General: Swelling (Right knee: Moderate effusion noted to posterior patellar region tender to palpation with decreased knee flexion secondary to pain and restriction.  No surrounding skin erythema or warmth, no ecchymosis.  Patella is located.) present.  Skin:    Findings: No rash.  Neurological:     Mental Status: He is alert.      ED Treatments / Results  Labs (all labs ordered are listed, but only abnormal results are displayed) Labs Reviewed - No data to display  EKG None  Radiology Dg Knee Complete 4 Views Right  Result Date: 10/07/2018 CLINICAL DATA:  Lateral knee pain EXAM: RIGHT KNEE - COMPLETE 4+ VIEW COMPARISON:  None. FINDINGS: No fracture or dislocation of the right knee. Mild tricompartmental joint space narrowing and osteophytosis. Very large nonspecific knee joint effusion. Diffuse soft tissue edema about the right knee. IMPRESSION: No fracture or dislocation of the right knee. Mild tricompartmental joint space narrowing and osteophytosis. Very large nonspecific knee joint effusion. Diffuse soft tissue edema about the right knee. Electronically Signed   By: Lauralyn Primes M.D.   On: 10/07/2018 10:45    Procedures Procedures (including critical care time)  Medications Ordered in ED Medications - No data to display   Initial Impression / Assessment and Plan / ED Course  I have reviewed the triage vital signs and the nursing notes.  Pertinent labs & imaging results that were available during my care of the patient were reviewed by me and considered in my medical  decision making (see chart for details).        BP (!) 150/118 (BP Location: Right Arm)   Pulse 89   Temp (!) 97.5 F (36.4 C)   Resp 18   SpO2 100%    Final Clinical Impressions(s) / ED Diagnoses   Final diagnoses:  Knee effusion, right    ED Discharge Orders         Ordered    HYDROcodone-acetaminophen (NORCO/VICODIN) 5-325 MG tablet  Every 6 hours PRN     10/07/18 1128         10:09 AM Patient here  with atraumatic right knee pain and swelling.  Currently on Xarelto for chronic atrial fibrillation.  I suspect this is likely hemarthrosis.  X-rays ordered.  11:25 AM Right knee x-ray demonstrate very large nonspecific knee joint effusion and diffuse soft tissue edema about the right knee.  I suspect this is likely a hemarthrosis.  I have low suspicion for infectious etiology such as septic joint, or gout.  This is less likely to be bursitis.  I did discuss options of the joint aspiration for therapeutic management and include the risk and benefit of the procedure.  At this time, patient prefers to follow-up with Ortho for further care.  Will provide Ace wrap, knee immobilizer, crutches, and have patient follow-up with orthopedic.  Care discussed with Dr. Clarice Pole.  Return precaution discussed.   Fayrene Helper, PA-C 10/07/18 1130    Arby Barrette, MD 10/07/18 1131    Arby Barrette, MD 10/09/18 [phone]

## 2018-10-28 ENCOUNTER — Other Ambulatory Visit: Payer: Self-pay

## 2018-10-28 ENCOUNTER — Emergency Department (HOSPITAL_COMMUNITY): Payer: Medicaid Other

## 2018-10-28 ENCOUNTER — Inpatient Hospital Stay (HOSPITAL_COMMUNITY)
Admission: EM | Admit: 2018-10-28 | Discharge: 2018-11-02 | DRG: 308 | Disposition: A | Discharge status: Home / Self Care | Payer: Medicaid Other | Attending: Internal Medicine | Admitting: Internal Medicine

## 2018-10-28 DIAGNOSIS — I4891 Unspecified atrial fibrillation: Secondary | ICD-10-CM | POA: Diagnosis present

## 2018-10-28 DIAGNOSIS — M25461 Effusion, right knee: Secondary | ICD-10-CM

## 2018-10-28 DIAGNOSIS — N183 Chronic kidney disease, stage 3 unspecified: Secondary | ICD-10-CM | POA: Diagnosis present

## 2018-10-28 DIAGNOSIS — G4733 Obstructive sleep apnea (adult) (pediatric): Secondary | ICD-10-CM | POA: Diagnosis present

## 2018-10-28 DIAGNOSIS — I4819 Other persistent atrial fibrillation: Principal | ICD-10-CM | POA: Diagnosis present

## 2018-10-28 DIAGNOSIS — Z9114 Patient's other noncompliance with medication regimen: Secondary | ICD-10-CM

## 2018-10-28 DIAGNOSIS — I5043 Acute on chronic combined systolic (congestive) and diastolic (congestive) heart failure: Secondary | ICD-10-CM | POA: Diagnosis present

## 2018-10-28 DIAGNOSIS — Z6835 Body mass index (BMI) 35.0-35.9, adult: Secondary | ICD-10-CM

## 2018-10-28 DIAGNOSIS — E876 Hypokalemia: Secondary | ICD-10-CM | POA: Diagnosis present

## 2018-10-28 DIAGNOSIS — I428 Other cardiomyopathies: Secondary | ICD-10-CM | POA: Diagnosis present

## 2018-10-28 DIAGNOSIS — I130 Hypertensive heart and chronic kidney disease with heart failure and stage 1 through stage 4 chronic kidney disease, or unspecified chronic kidney disease: Secondary | ICD-10-CM | POA: Diagnosis present

## 2018-10-28 DIAGNOSIS — E669 Obesity, unspecified: Secondary | ICD-10-CM | POA: Diagnosis present

## 2018-10-28 DIAGNOSIS — D631 Anemia in chronic kidney disease: Secondary | ICD-10-CM | POA: Diagnosis present

## 2018-10-28 DIAGNOSIS — Z801 Family history of malignant neoplasm of trachea, bronchus and lung: Secondary | ICD-10-CM

## 2018-10-28 DIAGNOSIS — Z8249 Family history of ischemic heart disease and other diseases of the circulatory system: Secondary | ICD-10-CM

## 2018-10-28 DIAGNOSIS — M1711 Unilateral primary osteoarthritis, right knee: Secondary | ICD-10-CM | POA: Diagnosis present

## 2018-10-28 DIAGNOSIS — Z9119 Patient's noncompliance with other medical treatment and regimen: Secondary | ICD-10-CM

## 2018-10-28 DIAGNOSIS — I160 Hypertensive urgency: Secondary | ICD-10-CM | POA: Diagnosis present

## 2018-10-28 DIAGNOSIS — Z7901 Long term (current) use of anticoagulants: Secondary | ICD-10-CM

## 2018-10-28 DIAGNOSIS — Z825 Family history of asthma and other chronic lower respiratory diseases: Secondary | ICD-10-CM

## 2018-10-28 DIAGNOSIS — Z86718 Personal history of other venous thrombosis and embolism: Secondary | ICD-10-CM

## 2018-10-28 DIAGNOSIS — I447 Left bundle-branch block, unspecified: Secondary | ICD-10-CM | POA: Diagnosis present

## 2018-10-28 LAB — BASIC METABOLIC PANEL
Anion gap: 13 (ref 5–15)
BUN: 24 mg/dL — ABNORMAL HIGH (ref 6–20)
CO2: 23 mmol/L (ref 22–32)
Calcium: 9.3 mg/dL (ref 8.9–10.3)
Chloride: 102 mmol/L (ref 98–111)
Creatinine, Ser: 1.81 mg/dL — ABNORMAL HIGH (ref 0.61–1.24)
GFR calc Af Amer: 50 mL/min — ABNORMAL LOW (ref >60)
GFR calc non Af Amer: 44 mL/min — ABNORMAL LOW (ref >60)
Glucose, Bld: 95 mg/dL (ref 70–99)
Potassium: 3.1 mmol/L — ABNORMAL LOW (ref 3.5–5.1)
Sodium: 138 mmol/L (ref 135–145)

## 2018-10-28 LAB — CBC
HCT: 37 % — ABNORMAL LOW (ref 39.0–52.0)
Hemoglobin: 11.5 g/dL — ABNORMAL LOW (ref 13.0–17.0)
MCH: 24.7 pg — AB (ref 26.0–34.0)
MCHC: 31.1 g/dL (ref 30.0–36.0)
MCV: 79.6 fL — AB (ref 80.0–100.0)
NRBC: 0 % (ref 0.0–0.2)
Platelets: 380 10*3/uL (ref 150–400)
RBC: 4.65 MIL/uL (ref 4.22–5.81)
RDW: 18.5 % — ABNORMAL HIGH (ref 11.5–15.5)
WBC: 9.7 10*3/uL (ref 4.0–10.5)

## 2018-10-28 LAB — I-STAT TROPONIN, ED: Troponin i, poc: 0.14 ng/mL (ref 0.00–0.08)

## 2018-10-28 MED ORDER — HYDROCODONE-ACETAMINOPHEN 5-325 MG PO TABS
1.0000 | ORAL_TABLET | Freq: Once | ORAL | Status: AC
  Administered 2018-10-28: 1 via ORAL
  Filled 2018-10-28: qty 1

## 2018-10-28 MED ORDER — DILTIAZEM HCL 25 MG/5ML IV SOLN
20.0000 mg | Freq: Once | INTRAVENOUS | Status: AC
  Administered 2018-10-28: 20 mg via INTRAVENOUS

## 2018-10-28 MED ORDER — DILTIAZEM HCL-DEXTROSE 100-5 MG/100ML-% IV SOLN (PREMIX)
5.0000 mg/h | INTRAVENOUS | Status: DC
  Administered 2018-10-28 – 2018-10-29 (×2): 5 mg/h via INTRAVENOUS
  Filled 2018-10-28 (×2): qty 100

## 2018-10-28 MED ORDER — POTASSIUM CHLORIDE CRYS ER 20 MEQ PO TBCR
40.0000 meq | EXTENDED_RELEASE_TABLET | Freq: Once | ORAL | Status: AC
  Administered 2018-10-29: 40 meq via ORAL
  Filled 2018-10-28: qty 2

## 2018-10-28 MED ORDER — SODIUM CHLORIDE 0.9% FLUSH: 3.0000 mL | Freq: Once | INTRAVENOUS | Status: DC

## 2018-10-28 NOTE — ED Provider Notes (Signed)
[facility] EMERGENCY DEPARTMENT Provider Note   CSN: [id number] Arrival date & time: 10/28/18  1932    History   Chief Complaint Chief Complaint  Patient presents with  . Atrial Fibrillation  . Chest Pain    HPI Cody [name] is a 48 y.o. male who presents with A.fib with RVR and L knee pain.  Past medical history significant for paroxysmal A. fib on Xarelto, chronic kidney disease, history of DVT, hypertension, CHF, medical non-compliance.  The patient states that for the past 2 days he has felt really bad.  He reports heart palpitations, lightheadedness, central chest pressure, and shortness of breath.  He states that he has these symptoms when he goes into A. fib.  He has been taking his blood pressure medicines up until today when he ran out.  He also reports severe left knee pain.  His knee is extremely swollen.  He denies any injury to the area.  It is been this way for approximately 3 weeks.  He was seen in the ED on February 20 for the same and had an x-ray of the knee which showed a joint effusion without any bony abnormality.  He was recommended to follow-up with orthopedics.  He states that he called his primary doctor to help him set up an appointment with orthopedics but they would not be able to get him in until next week and advised him to come to the ED if he had severe pain.  He denies fever, syncope, wheezing, abdominal pain, nausea, vomiting, leg swelling.  He has not taken Xarelto for several weeks.     HPI  Past Medical History:  Diagnosis Date  . Atrial fibrillation (HCC)   . CKD (chronic kidney disease), stage III (HCC)    [name] 02/20/2016  . DVT (deep venous thrombosis) (HCC) 01/2016   a. RLE - Dx 01/2016 in [name] - Took Xarelto for 30 days - out x 2 wks.  [name]   . Hypertension   . Hypertensive heart disease with CHF (congestive heart failure) [facility])     Patient Active Problem List   Diagnosis Date Noted  . Chronic atrial  fibrillation 08/02/2018  . History of DVT (deep vein thrombosis) 07/27/2018  . Anemia due to stage 3 chronic kidney disease (HCC) 07/27/2018  . Heme positive stool 07/27/2018  . Uncontrolled hypertension 07/19/2018  . Systolic CHF, acute on chronic (HCC) 07/17/2018  . Obstructive sleep apnea 12/11/2016  . H/O noncompliance with medical treatment, presenting hazards to health   . CKD (chronic kidney disease) stage 3, GFR 30-59 ml/min (HCC) 02/20/2016    Past Surgical History:  Procedure Laterality Date  . NO PAST SURGERIES          Home Medications    Prior to Admission medications   Medication Sig Start Date End Date Taking? Authorizing Provider  carvedilol (COREG) 25 MG tablet Take 1 tablet (25 mg total) by mouth 2 (two) times daily with a meal. 09/07/18   Dione Booze, MD  docusate sodium (COLACE) 50 MG capsule Take 1 capsule (50 mg total) by mouth 2 (two) times daily. 07/27/18   Storm Frisk, MD  furosemide (LASIX) 40 MG tablet Take 2 tablets (80 mg total) by mouth 2 (two) times daily. 08/02/18   Storm Frisk, MD  hydrALAZINE (APRESOLINE) 50 MG tablet Take 1 tablet (50 mg total) by mouth 3 (three) times daily. 07/27/18   Storm Frisk, MD  HYDROcodone-acetaminophen (NORCO/VICODIN) 5-325 MG tablet  Take 1-2 tablets by mouth every 6 (six) hours as needed for moderate pain. 10/07/18   Fayrene Helper, PA-C  lisinopril (PRINIVIL,ZESTRIL) 40 MG tablet Take 1 tablet (40 mg total) by mouth daily. 09/07/18   Dione Booze, MD  metolazone (ZAROXOLYN) 2.5 MG tablet Take 1 tablet (2.5 mg total) by mouth daily. 08/02/18   Storm Frisk, MD  potassium chloride SA (K-DUR,KLOR-CON) 20 MEQ tablet Take 1 tablet (20 mEq total) by mouth 2 (two) times daily. 07/27/18   Storm Frisk, MD  Rivaroxaban (XARELTO) 15 MG TABS tablet Take 1 tablet (15 mg total) by mouth daily with supper. 07/27/18   Storm Frisk, MD    Family History Family History  Problem Relation Age of Onset  .  Asthma Mother        alive and well  . Hypertension Father        alive  . Lung cancer Father   . Cancer Father     Social History Social History   Tobacco Use  . Smoking status: Never Smoker  . Smokeless tobacco: Never Used  Substance Use Topics  . Alcohol use: No  . Drug use: No     Allergies   Patient has no known allergies.   Review of Systems Review of Systems  Constitutional: Positive for chills. Negative for fever.  Respiratory: Positive for cough and chest tightness. Negative for shortness of breath and wheezing.   Cardiovascular: Positive for chest pain and palpitations. Negative for leg swelling.  Gastrointestinal: Negative for abdominal pain, nausea and vomiting.  Genitourinary: Negative for difficulty urinating.  Musculoskeletal: Positive for joint swelling.  Neurological: Positive for light-headedness. Negative for syncope.  All other systems reviewed and are negative.    Physical Exam Updated Vital Signs BP (!) 181/157   Pulse (!) 42   Temp 98.6 F (37 C) (Oral)   Resp (!) 27   Ht 6' (1.829 m)   Wt 117.9 kg   SpO2 100%   BMI 35.26 kg/m   Physical Exam Vitals signs and nursing note reviewed.  Constitutional:      General: He is not in acute distress.    Appearance: He is well-developed. He is obese. He is not ill-appearing.     Comments: Calm and cooperative  HENT:     Head: Normocephalic and atraumatic.  Eyes:     General: No scleral icterus.       Right eye: No discharge.        Left eye: No discharge.     Conjunctiva/sclera: Conjunctivae normal.     Pupils: Pupils are equal, round, and reactive to light.  Neck:     Musculoskeletal: Normal range of motion.  Cardiovascular:     Rate and Rhythm: Tachycardia present. Rhythm regularly irregular.  Pulmonary:     Effort: Pulmonary effort is normal. Tachypnea present. No respiratory distress.     Breath sounds: No stridor. No wheezing, rhonchi or rales.  Chest:     Chest wall: No  tenderness.  Abdominal:     General: There is no distension.     Palpations: Abdomen is soft.     Tenderness: There is no abdominal tenderness.  Musculoskeletal:     Comments: Left knee: Large joint effusion.  No redness.  Decreased range of motion.  Intact distal pulse.  No significant lower extremity edema  Skin:    General: Skin is warm and dry.  Neurological:     Mental Status: He is alert and oriented  to person, place, and time.  Psychiatric:        Behavior: Behavior normal.      ED Treatments / Results  Labs (all labs ordered are listed, but only abnormal results are displayed) Labs Reviewed  BASIC METABOLIC PANEL - Abnormal; Notable for the following components:      Result Value   Potassium 3.1 (*)    BUN 24 (*)    Creatinine, Ser 1.81 (*)    GFR calc non Af Amer 44 (*)    GFR calc Af Amer 50 (*)    All other components within normal limits  CBC - Abnormal; Notable for the following components:   Hemoglobin 11.5 (*)    HCT 37.0 (*)    MCV 79.6 (*)    MCH 24.7 (*)    RDW 18.5 (*)    All other components within normal limits  I-STAT TROPONIN, ED - Abnormal; Notable for the following components:   Troponin i, poc 0.14 (*)    All other components within normal limits    EKG EKG Interpretation  Date/Time:  Thursday October 28 2018 18:38:56 EDT Ventricular Rate:  140 PR Interval:    QRS Duration: 126 QT Interval:  304 QTC Calculation: 464 R Axis:   108 Text Interpretation:  Atrial fibrillation with rapid ventricular response Rightward axis Non-specific intra-ventricular conduction block T wave abnormality, consider inferolateral ischemia Abnormal ECG No significant change since last tracing Confirmed by Jacalyn Lefevre [id number]) on 10/28/2018 7:49:59 PM   Radiology Dg Chest Portable 1 View  Result Date: 10/28/2018 CLINICAL DATA:  Right leg pain, chest pain and shortness of breath. History of hypertension. EXAM: PORTABLE CHEST 1 VIEW COMPARISON:  09/07/2018  FINDINGS: Moderate enlargement of the cardiopericardial silhouette, stable. No mediastinal or hilar masses. No convincing adenopathy. Mild prominence of the bronchovascular markings, stable. Lungs otherwise clear. No pleural effusion. No pneumothorax. Skeletal structures are grossly intact. IMPRESSION: 1. No acute cardiopulmonary disease. 2. Moderate cardiomegaly. Electronically Signed   By: Amie Portland M.D.   On: 10/28/2018 20:17    Procedures Procedures (including critical care time)  CRITICAL CARE Performed by: Bethel Born   Total critical care time: 35 minutes  Critical care time was exclusive of separately billable procedures and treating other patients.  Critical care was necessary to treat or prevent imminent or life-threatening deterioration.  Critical care was time spent personally by me on the following activities: development of treatment plan with patient and/or surrogate as well as nursing, discussions with consultants, evaluation of patient's response to treatment, examination of patient, obtaining history from patient or surrogate, ordering and performing treatments and interventions, ordering and review of laboratory studies, ordering and review of radiographic studies, pulse oximetry and re-evaluation of patient's condition.   Medications Ordered in ED Medications  sodium chloride flush (NS) 0.9 % injection 3 mL (3 mLs Intravenous Not Given 10/28/18 2222)  diltiazem (CARDIZEM) 100 mg in dextrose 5% (1 mg/mL) infusion (5 mg/hr Intravenous New Bag/Given 10/28/18 2044)  potassium chloride SA (K-DUR,KLOR-CON) CR tablet 40 mEq (has no administration in time range)  HYDROcodone-acetaminophen (NORCO/VICODIN) 5-325 MG per tablet 1 tablet (1 tablet Oral Given 10/28/18 2050)  diltiazem (CARDIZEM) injection 20 mg (20 mg Intravenous Given 10/28/18 2111)     Initial Impression / Assessment and Plan / ED Course  I have reviewed the triage vital signs and the nursing notes.   Pertinent labs & imaging results that were available during my care of the patient were reviewed by  me and considered in my medical decision making (see chart for details).  48 year old male presents with chest pain, SOB, lightheadedness and is found to be in A.fib with RVR. He is also complaining of R knee pain and swelling which has been ongoing for several weeks and he's already been seen in the ED once for this. He is hypertensive with initial HR in the 150s-170s. EKG confirms A.fib with RVR.  He was started on a diltiazem drip. Shared visit with Dr. Particia Nearing.   CBC is remarkable for mild anemia. CMP is remarkable for hypokalemia (3.1) and elevated SCr (1.8). I-stat trop is .14. He frequently has elevated troponins. Suspect demand ischemia in the setting of A.fib with RVR and uncontrolled hypertension. After he was rate controlled the patient reports his symptoms are improved. CXR is negative for pulmonary edema. Plan is to admit since pt is requiring IV dilt infusion and in the setting of poor social situation as pt does not have access to any meds. Discussed with Dr. Toniann Fail who will admit.    Final Clinical Impressions(s) / ED Diagnoses   Final diagnoses:  Atrial fibrillation with RVR (HCC)  Effusion, right knee    ED Discharge Orders    None       Bethel Born, PA-C 10/29/18 1516    Jacalyn Lefevre, MD 11/01/18 [phone]

## 2018-10-28 NOTE — ED Notes (Signed)
Pt denies CP, reports it has resolved

## 2018-10-28 NOTE — ED Notes (Signed)
Pt's I Stat Troponin was 0.14 it was reported to [employer].

## 2018-10-29 ENCOUNTER — Other Ambulatory Visit: Payer: Self-pay

## 2018-10-29 ENCOUNTER — Observation Stay (HOSPITAL_BASED_OUTPATIENT_CLINIC_OR_DEPARTMENT_OTHER): Payer: Medicaid Other

## 2018-10-29 ENCOUNTER — Observation Stay (HOSPITAL_COMMUNITY): Payer: Medicaid Other

## 2018-10-29 ENCOUNTER — Encounter (HOSPITAL_COMMUNITY): Payer: Self-pay | Admitting: Internal Medicine

## 2018-10-29 DIAGNOSIS — N183 Chronic kidney disease, stage 3 (moderate): Secondary | ICD-10-CM

## 2018-10-29 DIAGNOSIS — I340 Nonrheumatic mitral (valve) insufficiency: Secondary | ICD-10-CM

## 2018-10-29 DIAGNOSIS — I361 Nonrheumatic tricuspid (valve) insufficiency: Secondary | ICD-10-CM

## 2018-10-29 DIAGNOSIS — N179 Acute kidney failure, unspecified: Secondary | ICD-10-CM

## 2018-10-29 DIAGNOSIS — I4891 Unspecified atrial fibrillation: Secondary | ICD-10-CM

## 2018-10-29 DIAGNOSIS — G4733 Obstructive sleep apnea (adult) (pediatric): Secondary | ICD-10-CM

## 2018-10-29 DIAGNOSIS — I160 Hypertensive urgency: Secondary | ICD-10-CM | POA: Diagnosis present

## 2018-10-29 DIAGNOSIS — E876 Hypokalemia: Secondary | ICD-10-CM

## 2018-10-29 DIAGNOSIS — I4819 Other persistent atrial fibrillation: Secondary | ICD-10-CM | POA: Diagnosis present

## 2018-10-29 LAB — SEDIMENTATION RATE: Sed Rate: 12 mm/hr (ref 0–16)

## 2018-10-29 LAB — TROPONIN I
Troponin I: 0.09 ng/mL (ref <0.03)
Troponin I: 0.07 ng/mL (ref <0.03)
Troponin I: 0.06 ng/mL (ref <0.03)

## 2018-10-29 LAB — COMPREHENSIVE METABOLIC PANEL
ALK PHOS: 74 U/L (ref 38–126)
ALT: 24 U/L (ref 0–44)
AST: 32 U/L (ref 15–41)
Albumin: 3.6 g/dL (ref 3.5–5.0)
Anion gap: 12 (ref 5–15)
BUN: 23 mg/dL — ABNORMAL HIGH (ref 6–20)
CO2: 24 mmol/L (ref 22–32)
Calcium: 9.1 mg/dL (ref 8.9–10.3)
Chloride: 102 mmol/L (ref 98–111)
Creatinine, Ser: 1.83 mg/dL — ABNORMAL HIGH (ref 0.61–1.24)
GFR calc Af Amer: 50 mL/min — ABNORMAL LOW (ref >60)
GFR calc non Af Amer: 43 mL/min — ABNORMAL LOW (ref >60)
Glucose, Bld: 166 mg/dL — ABNORMAL HIGH (ref 70–99)
Potassium: 3 mmol/L — ABNORMAL LOW (ref 3.5–5.1)
Sodium: 138 mmol/L (ref 135–145)
Total Bilirubin: 2.4 mg/dL — ABNORMAL HIGH (ref 0.3–1.2)
Total Protein: 7 g/dL (ref 6.5–8.1)

## 2018-10-29 LAB — CBC
HCT: 37.6 % — ABNORMAL LOW (ref 39.0–52.0)
HEMOGLOBIN: 11.3 g/dL — AB (ref 13.0–17.0)
MCH: 24.2 pg — ABNORMAL LOW (ref 26.0–34.0)
MCHC: 30.1 g/dL (ref 30.0–36.0)
MCV: 80.7 fL (ref 80.0–100.0)
Platelets: 336 10*3/uL (ref 150–400)
RBC: 4.66 MIL/uL (ref 4.22–5.81)
RDW: 18.4 % — ABNORMAL HIGH (ref 11.5–15.5)
WBC: 9.3 10*3/uL (ref 4.0–10.5)
nRBC: 0 % (ref 0.0–0.2)

## 2018-10-29 LAB — ECHOCARDIOGRAM COMPLETE
Height: 72 in
WEIGHTICAEL: 4261.05 [oz_av]

## 2018-10-29 LAB — TSH: TSH: 1.474 u[IU]/mL (ref 0.350–4.500)

## 2018-10-29 LAB — MAGNESIUM: Magnesium: 1.7 mg/dL (ref 1.7–2.4)

## 2018-10-29 LAB — URIC ACID: Uric Acid, Serum: 13.4 mg/dL — ABNORMAL HIGH (ref 3.7–8.6)

## 2018-10-29 LAB — MRSA PCR SCREENING: MRSA by PCR: NEGATIVE

## 2018-10-29 MED ORDER — TRAMADOL HCL 50 MG PO TABS
50.0000 mg | ORAL_TABLET | Freq: Four times a day (QID) | ORAL | Status: DC | PRN
  Administered 2018-10-29 – 2018-10-31 (×4): 50 mg via ORAL
  Filled 2018-10-29 (×4): qty 1

## 2018-10-29 MED ORDER — RIVAROXABAN 20 MG PO TABS
20.0000 mg | ORAL_TABLET | Freq: Every day | ORAL | Status: DC
  Administered 2018-10-29 – 2018-11-01 (×4): 20 mg via ORAL
  Filled 2018-10-29 (×4): qty 1

## 2018-10-29 MED ORDER — ONDANSETRON HCL 4 MG/2ML IJ SOLN: 4.0000 mg | Freq: Four times a day (QID) | INTRAMUSCULAR | Status: DC | PRN

## 2018-10-29 MED ORDER — MORPHINE SULFATE (PF) 2 MG/ML IV SOLN
2.0000 mg | Freq: Once | INTRAVENOUS | Status: AC
  Administered 2018-10-29: 2 mg via INTRAVENOUS
  Filled 2018-10-29: qty 1

## 2018-10-29 MED ORDER — MORPHINE SULFATE (PF) 2 MG/ML IV SOLN
1.0000 mg | INTRAVENOUS | Status: DC | PRN
  Administered 2018-10-30 (×2): 2 mg via INTRAVENOUS
  Filled 2018-10-29 (×2): qty 1

## 2018-10-29 MED ORDER — HYDROCODONE-ACETAMINOPHEN 5-325 MG PO TABS
1.0000 | ORAL_TABLET | Freq: Four times a day (QID) | ORAL | Status: DC | PRN
  Administered 2018-10-29 – 2018-10-30 (×3): 2 via ORAL
  Filled 2018-10-29 (×3): qty 2

## 2018-10-29 MED ORDER — DICLOFENAC SODIUM 1 % TD GEL
2.0000 g | Freq: Four times a day (QID) | TRANSDERMAL | Status: DC
  Administered 2018-10-29 – 2018-11-02 (×12): 2 g via TOPICAL
  Filled 2018-10-29: qty 100

## 2018-10-29 MED ORDER — LISINOPRIL 20 MG PO TABS
40.0000 mg | ORAL_TABLET | Freq: Every day | ORAL | Status: DC
  Administered 2018-10-29 – 2018-11-02 (×5): 40 mg via ORAL
  Filled 2018-10-29 (×6): qty 2

## 2018-10-29 MED ORDER — HYDRALAZINE HCL 20 MG/ML IJ SOLN
5.0000 mg | INTRAMUSCULAR | Status: DC | PRN
  Administered 2018-10-31: 5 mg via INTRAVENOUS
  Filled 2018-10-29: qty 1

## 2018-10-29 MED ORDER — ONDANSETRON HCL 4 MG PO TABS: 4.0000 mg | ORAL_TABLET | Freq: Four times a day (QID) | ORAL | Status: DC | PRN

## 2018-10-29 MED ORDER — MORPHINE SULFATE (PF) 2 MG/ML IV SOLN
1.0000 mg | INTRAVENOUS | Status: DC | PRN
  Administered 2018-10-29 (×2): 1 mg via INTRAVENOUS
  Filled 2018-10-29 (×2): qty 1

## 2018-10-29 MED ORDER — HYDRALAZINE HCL 50 MG PO TABS
50.0000 mg | ORAL_TABLET | Freq: Three times a day (TID) | ORAL | Status: DC
  Administered 2018-10-29 – 2018-11-02 (×13): 50 mg via ORAL
  Filled 2018-10-29 (×11): qty 1
  Filled 2018-10-29: qty 2
  Filled 2018-10-29: qty 1

## 2018-10-29 MED ORDER — DOCUSATE SODIUM 100 MG PO CAPS
100.0000 mg | ORAL_CAPSULE | Freq: Two times a day (BID) | ORAL | Status: DC
  Administered 2018-10-29 – 2018-11-01 (×7): 100 mg via ORAL
  Filled 2018-10-29 (×8): qty 1

## 2018-10-29 MED ORDER — CARVEDILOL 25 MG PO TABS
25.0000 mg | ORAL_TABLET | Freq: Two times a day (BID) | ORAL | Status: DC
  Administered 2018-10-29: 25 mg via ORAL
  Filled 2018-10-29: qty 1

## 2018-10-29 MED ORDER — ACETAMINOPHEN 325 MG PO TABS: 650.0000 mg | ORAL_TABLET | Freq: Four times a day (QID) | ORAL | Status: DC | PRN

## 2018-10-29 MED ORDER — METOPROLOL TARTRATE 50 MG PO TABS
50.0000 mg | ORAL_TABLET | Freq: Two times a day (BID) | ORAL | Status: DC
  Administered 2018-10-29 – 2018-10-30 (×3): 50 mg via ORAL
  Filled 2018-10-29 (×3): qty 1

## 2018-10-29 MED ORDER — FUROSEMIDE 80 MG PO TABS
80.0000 mg | ORAL_TABLET | Freq: Two times a day (BID) | ORAL | Status: DC
  Administered 2018-10-29 – 2018-10-31 (×5): 80 mg via ORAL
  Filled 2018-10-29 (×5): qty 1

## 2018-10-29 MED ORDER — ACETAMINOPHEN 650 MG RE SUPP: 650.0000 mg | Freq: Four times a day (QID) | RECTAL | Status: DC | PRN

## 2018-10-29 MED ORDER — POTASSIUM CHLORIDE CRYS ER 20 MEQ PO TBCR
20.0000 meq | EXTENDED_RELEASE_TABLET | Freq: Two times a day (BID) | ORAL | Status: DC
  Administered 2018-10-29 – 2018-11-02 (×9): 20 meq via ORAL
  Filled 2018-10-29 (×9): qty 1

## 2018-10-29 NOTE — TOC Initial Note (Signed)
Transition of Care [facility]) - Initial/Assessment Note    Patient Details  Name: Cody [name] MRN: [id number] Date of Birth: Nov 24, 1970  [facility] of [facility]) CM/SW Contact:    Cherylann Parr, RN Phone Number: 10/29/2018, 8:56 AM  Clinical Narrative:  Pt independent from home.  Pt confirmed he is active with [facility] and has been approved for medication assistance with clinic pharmacy.  Pt informed CM that his meds cost him on average $3-$10 per refill.  Pt informed CM that he doesn't get paid until next week and therefore he was unable to pay copays for medications. Pt has already received MATCH within the last year and therefore benefit can not be utilized at this time.  CM spoke with pt about saving money from paychecks to pay for monthly medication expenses, pt accepted information and also informed CM that he would start reaching out to family and friends when he is unable to afford medications.  Pt will call [facility] and make post discharge follow up appt.  CM will continue to follow .              Expected Discharge Plan: Home/Self Care     Patient Goals and CMS Choice Patient states their goals for this hospitalization and ongoing recovery are:: To feel better      Expected Discharge Plan and Services Expected Discharge Plan: Home/Self Care     Living arrangements for the past 2 months: Single Family Home Expected Discharge Date: 10/31/18                        Prior Living Arrangements/Services Living arrangements for the past 2 months: Single Family Home   Patient language and need for interpreter reviewed:: Yes Do you feel safe going back to the place where you live?: Yes      Need for Family Participation in Patient Care: No (Comment) Care giver support system in place?: Yes (comment)(Independent)   Criminal Activity/Legal Involvement Pertinent to Current Situation/Hospitalization: No - Comment as needed  Activities of Daily Living Home Assistive Devices/Equipment:  None ADL Screening (condition at time of admission) Patient's cognitive ability adequate to safely complete daily activities?: Yes Is the patient deaf or have difficulty hearing?: No Does the patient have difficulty seeing, even when wearing glasses/contacts?: No Does the patient have difficulty concentrating, remembering, or making decisions?: No Patient able to express need for assistance with ADLs?: Yes Does the patient have difficulty dressing or bathing?: No Independently performs ADLs?: Yes (appropriate for developmental age) Does the patient have difficulty walking or climbing stairs?: Yes Weakness of Legs: Right Weakness of Arms/Hands: None  Permission Sought/Granted                  Emotional Assessment Appearance:: Appears stated age Attitude/Demeanor/Rapport: Self-Confident, Engaged Affect (typically observed): Accepting Orientation: : Oriented to Self, Oriented to Place, Oriented to  Time, Oriented to Situation   Psych Involvement: No (comment)  Admission diagnosis:  Atrial fibrillation with RVR (HCC) [I48.91] A-fib (HCC) [I48.91] Patient Active Problem List   Diagnosis Date Noted  . Atrial fibrillation with RVR (HCC) 10/29/2018  . Hypertensive urgency 10/29/2018  . A-fib (HCC) 10/29/2018  . Chronic atrial fibrillation 08/02/2018  . History of DVT (deep vein thrombosis) 07/27/2018  . Anemia due to stage 3 chronic kidney disease (HCC) 07/27/2018  . Heme positive stool 07/27/2018  . Uncontrolled hypertension 07/19/2018  . Systolic CHF, acute on chronic (HCC) 07/17/2018  . Obstructive sleep apnea  12/11/2016  . H/O noncompliance with medical treatment, presenting hazards to health   . CKD (chronic kidney disease) stage 3, GFR 30-59 ml/min (HCC) 02/20/2016   PCP:  Storm Frisk, MD Pharmacy:   [facility] [address] [place] - [address] [place] [address] Phone: [phone] Fax: [phone]  [facility] - [place], [place] - [address] [place] [address] Phone: [phone] Fax: [phone]     Social Determinants of Health (SDOH) Interventions    Readmission Risk Interventions  No flowsheet data found.

## 2018-10-29 NOTE — H&P (Addendum)
History and Physical    Cody [name] KCM:[id number] DOB: 09-22-1970 DOA: 10/28/2018  PCP: Storm Frisk, MD  Patient coming from: Home.  Chief Complaint: Shortness of breath.  HPI: Cody [name] is a 48 y.o. male with history of atrial fibrillation, hypertension, chronic kidney disease stage III, previous history of DVT, anemia, possible sleep apnea and recently has been having right knee swelling presents to the ER because of increasing shortness of breath last 2 days.  Patient gets short of breath on exertion.  Has some chest tightness.  Denies any fever chills or productive cough.  Patient states he has ran out of his medications and has not taken any of them for last 2 days.  Has not taken his Xarelto for last 3 weeks.  Over the last 3 weeks patient also noted increasing swelling in his right knee.  Denies any trauma insect bite.  No erythema or warmth over the right knee.  ED Course: In the ER patient is found to be in A. fib with RVR.  Chest x-ray does not show anything acute.  Troponin was mildly elevated.  Blood pressure was elevated systolic more than 180.  Patient was started on Cardizem infusion admitted for A. fib with RVR.  Review of Systems: As per HPI, rest all negative.   Past Medical History:  Diagnosis Date   Atrial fibrillation (HCC)    CKD (chronic kidney disease), stage III (HCC)    [name] 02/20/2016   DVT (deep venous thrombosis) (HCC) 01/2016   a. RLE - Dx 01/2016 in [name] - Took Xarelto for 30 days - out x 2 wks.   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 He has never used smokeless tobacco. He reports that he does not drink alcohol or use drugs.  No Known Allergies  Family History  Problem Relation Age of Onset   Asthma Mother        alive and well   Hypertension Father        alive   Lung cancer Father    Cancer Father     Prior to Admission medications   Medication Sig Start Date End Date Taking? Authorizing Provider  acetaminophen (TYLENOL) 650 MG CR tablet Take 1,300 mg by mouth every 12 (twelve) hours as needed for pain.    Yes [provider]  carvedilol (COREG) 25 MG tablet Take 1 tablet (25 mg total) by mouth 2 (two) times daily with a meal. 09/07/18  Yes Dione Booze, MD  furosemide (LASIX) 40 MG tablet Take 2 tablets (80 mg total) by mouth 2 (two) times daily. 08/02/18  Yes Storm Frisk, MD  hydrALAZINE (APRESOLINE) 50 MG tablet Take 1 tablet (50 mg total) by mouth 3 (three) times daily. 07/27/18  Yes Storm Frisk, MD  lisinopril (PRINIVIL,ZESTRIL) 40 MG tablet Take 1 tablet (40 mg total) by mouth daily. 09/07/18  Yes Dione Booze, MD  metolazone (ZAROXOLYN) 2.5 MG tablet Take 1 tablet (2.5 mg total) by mouth daily. 08/02/18  Yes Storm Frisk, MD  potassium chloride SA (K-DUR,KLOR-CON) 20 MEQ tablet Take 1 tablet (20 mEq total) by mouth 2 (two) times daily. 07/27/18  Yes Storm Frisk, MD  Rivaroxaban (XARELTO) 15 MG TABS tablet Take 1 tablet (15 mg total) by  mouth daily with supper. 07/27/18  Yes Storm Frisk, MD  docusate sodium (COLACE) 50 MG capsule Take 1 capsule (50 mg total) by mouth 2 (two) times daily. Patient not taking: Reported on 10/28/2018 07/27/18   Storm Frisk, MD  HYDROcodone-acetaminophen (NORCO/VICODIN) 5-325 MG tablet Take 1-2 tablets by mouth every 6 (six) hours as needed for moderate pain. Patient not taking: Reported on 10/28/2018 10/07/18   Fayrene Helper, PA-C    Physical Exam: Vitals:   10/28/18 2300 10/28/18 2315 10/29/18 0000 10/29/18 0216  BP: (!) 189/130 (!) 174/119 (!) 155/114 (!) 174/118  Pulse: 85 87 86 91  Resp: 18 19 (!) 23 20  Temp:      TempSrc:      SpO2: 100% 100% 100% 99%  Weight:      Height:          Constitutional: Moderately built and nourished. Vitals:    10/28/18 2300 10/28/18 2315 10/29/18 0000 10/29/18 0216  BP: (!) 189/130 (!) 174/119 (!) 155/114 (!) 174/118  Pulse: 85 87 86 91  Resp: 18 19 (!) 23 20  Temp:      TempSrc:      SpO2: 100% 100% 100% 99%  Weight:      Height:       Eyes: Anicteric no pallor. ENMT: No discharge from the ears eyes nose and mouth. Neck: No mass or.  No neck rigidity.  JVD elevated. Respiratory: No rhonchi or crepitations. Cardiovascular: S1-S2 heard. Abdomen: Soft nontender bowel sounds present. Musculoskeletal: Right knee swollen but no warmth or erythema. Skin: No rash. Neurologic: Alert awake oriented to time place and person.  Moves all extremities. Psychiatric: Appears normal.   Labs on Admission: I have personally reviewed following labs and imaging studies  CBC: Recent Labs  Lab 10/28/18 1948  WBC 9.7  HGB 11.5*  HCT 37.0*  MCV 79.6*  PLT 380   Basic Metabolic Panel: Recent Labs  Lab 10/28/18 1948  NA 138  K 3.1*  CL 102  CO2 23  GLUCOSE 95  BUN 24*  CREATININE 1.81*  CALCIUM 9.3   GFR: Estimated Creatinine Clearance: 66.9 mL/min (A) (by C-G formula based on SCr of 1.81 mg/dL (H)). Liver Function Tests: No results for input(s): AST, ALT, ALKPHOS, BILITOT, PROT, ALBUMIN in the last 168 hours. No results for input(s): LIPASE, AMYLASE in the last 168 hours. No results for input(s): AMMONIA in the last 168 hours. Coagulation Profile: No results for input(s): INR, PROTIME in the last 168 hours. Cardiac Enzymes: No results for input(s): CKTOTAL, CKMB, CKMBINDEX, TROPONINI in the last 168 hours. BNP (last 3 results) No results for input(s): PROBNP in the last 8760 hours. HbA1C: No results for input(s): HGBA1C in the last 72 hours. CBG: No results for input(s): GLUCAP in the last 168 hours. Lipid Profile: No results for input(s): CHOL, HDL, LDLCALC, TRIG, CHOLHDL, LDLDIRECT in the last 72 hours. Thyroid Function Tests: No results for input(s): TSH, T4TOTAL, FREET4, T3FREE,  THYROIDAB in the last 72 hours. Anemia Panel: No results for input(s): VITAMINB12, FOLATE, FERRITIN, TIBC, IRON, RETICCTPCT in the last 72 hours. Urine analysis:    Component Value Date/Time   LABSPEC >=1.030 01/27/2017 1343   PHURINE 6.5 01/27/2017 1343   GLUCOSEU NEGATIVE 01/27/2017 1343   HGBUR TRACE (A) 01/27/2017 1343   BILIRUBINUR negative 07/27/2018 1530   KETONESUR negative 07/27/2018 1530   KETONESUR NEGATIVE 01/27/2017 1343   PROTEINUR >=300 (A) 01/27/2017 1343   UROBILINOGEN 1.0 07/27/2018 1530  UROBILINOGEN 2.0 (H) 01/27/2017 1343   NITRITE Negative 07/27/2018 1530   NITRITE NEGATIVE 01/27/2017 1343   LEUKOCYTESUR Negative 07/27/2018 1530   Sepsis Labs: @LABRCNTIP (procalcitonin:4,lacticidven:4) )No results found for this or any previous visit (from the past 240 hour(s)).   Radiological Exams on Admission: Dg Chest Portable 1 View  Result Date: 10/28/2018 CLINICAL DATA:  Right leg pain, chest pain and shortness of breath. History of hypertension. EXAM: PORTABLE CHEST 1 VIEW COMPARISON:  09/07/2018 FINDINGS: Moderate enlargement of the cardiopericardial silhouette, stable. No mediastinal or hilar masses. No convincing adenopathy. Mild prominence of the bronchovascular markings, stable. Lungs otherwise clear. No pleural effusion. No pneumothorax. Skeletal structures are grossly intact. IMPRESSION: 1. No acute cardiopulmonary disease. 2. Moderate cardiomegaly. Electronically Signed   By: Amie Portland M.D.   On: 10/28/2018 20:17    EKG: Independently reviewed.  A. fib with RVR.  Assessment/Plan Principal Problem:   Atrial fibrillation with RVR (HCC) Active Problems:   CKD (chronic kidney disease) stage 3, GFR 30-59 ml/min (HCC)   Obstructive sleep apnea   Hypertensive urgency   A-fib (HCC)    1. A. fib with RVR likely precipitated by patient being noncompliant with his medication including Coreg.  Has missed his medication last 2 days.  Cardizem infusion has been  started.  Coreg has been ordered up once Coreg has been started and heart rate improves may wean off Cardizem infusion.  Patient has not taken his Xarelto for last 3 weeks.  Will start anticoagulation once CAT scan of his right knee does not show any definite evidence of bleeding.  Since patient also has some chest tightness will check cardiac markers.  Check TSH. 2. Hypertensive urgency likely from patient being noncompliant with medications.  We have restarted his home medications including lisinopril Coreg hydralazine Lasix.  Patient is also on Cardizem infusion.  Closely follow blood pressure trends. 3. Right knee swelling cause not clear.  Will check sed rate uric acid CT scan is pending.  May need arthrocentesis.  Patient does not look septic or other knee does not look warm or erythematous. 4. Chronic CHF last EF measured was in 2017 and patient's primary care physician was planning to get 2D echo.  Given the shortness of breath symptoms we will recheck 2D echo continue Lasix.  Last EF measured was 50 to 55% in 2017. 5. Chronic kidney disease stage III creatinine appears to be at baseline.  Note that patient is on Lasix and lisinopril.  Closely follow metabolic panel. 6. Anemia likely from renal disease follow CBC. 7. Possible sleep apnea as per the recent primary care notes. 8. Hypokalemia correct potassium before giving Lasix.  Check magnesium.   DVT prophylaxis: SCDs for now will change to anticoagulation once CT scan does not show any hemarthrosis. Code Status: Full code. Family Communication: Discussed with patient. Disposition Plan: Home. Consults called: None. Admission status: Observation.   Eduard Clos MD [facility] Pager [phone].  If 7PM-7AM, please contact night-coverage [email or web address] Password [facility]  10/29/2018, 2:36 AM

## 2018-10-29 NOTE — Progress Notes (Signed)
  Echocardiogram 2D Echocardiogram has been performed.  Leta Jungling M 10/29/2018, 8:35 AM

## 2018-10-29 NOTE — ED Notes (Signed)
Patient transported to CT 

## 2018-10-29 NOTE — ED Notes (Signed)
ED TO INPATIENT HANDOFF REPORT  ED Nurse Name and Phone #: Lew Dawes RN [phone]  S Name/Age/Gender Wynonia Sours 48 y.o. male Room/Bed: 030C/030C  Code Status   Code Status: Full Code  Home/SNF/Other home {Patient oriented x4 Is this baseline?yes  Triage Complete: Triage complete  Chief Complaint SOB and right leg pain  Triage Note Patient presents to ed c/o chest pain and right knee pain. States right knee has been hurting for a long time increased pain with weigh bearing. Denies injury. Patient was in a-fib rate of 180-150 upon arrival. cardizem bolus of 20 mg decreased rate to 80, now on drip at 5 Allergies No Known Allergies  Level of Care/Admitting Diagnosis ED Disposition    ED Disposition Condition Comment   Admit  Hospital Area: [facility] [[id number]]  Level of Care: Progressive [102]  I expect the patient will be discharged within 24 hours: No (not a candidate for 5C-Observation unit)  Diagnosis: A-fib [facility]) [[id number]]  Admitting Physician: Eduard Clos [phone]  Attending Physician: Eduard Clos Florian.Pax  PT Class (Do Not Modify): Observation [104]  PT Acc Code (Do Not Modify): Observation [10022]       B Medical/Surgery History Past Medical History:  Diagnosis Date  . Atrial fibrillation (HCC)   . CKD (chronic kidney disease), stage III (HCC)    [name] 02/20/2016  . DVT (deep venous thrombosis) (HCC) 01/2016   a. RLE - Dx 01/2016 in [name] - Took Xarelto for 30 days - out x 2 wks.  [name]   . Hypertension   . Hypertensive heart disease with CHF (congestive heart failure) (HCC)    Past Surgical History:  Procedure Laterality Date  . NO PAST SURGERIES       A IV Location/Drains/Wounds Patient Lines/Drains/Airways Status   Active Line/Drains/Airways    Name:   Placement date:   Placement time:   Site:   Days:   Peripheral IV 10/28/18 Right Antecubital   10/28/18    2006    Antecubital   1   Peripheral IV 10/29/18  Right Hand   10/29/18    0224    Hand   less than 1          Intake/Output Last 24 hours No intake or output data in the 24 hours ending 10/29/18 0415  Labs/Imaging Results for orders placed or performed during the hospital encounter of 10/28/18 (from the past 48 hour(s))  Basic metabolic panel     Status: Abnormal   Collection Time: 10/28/18  7:48 PM  Result Value Ref Range   Sodium 138 135 - 145 mmol/L   Potassium 3.1 (L) 3.5 - 5.1 mmol/L   Chloride 102 98 - 111 mmol/L   CO2 23 22 - 32 mmol/L   Glucose, Bld 95 70 - 99 mg/dL   BUN 24 (H) 6 - 20 mg/dL   Creatinine, Ser 5.83 (H) 0.61 - 1.24 mg/dL   Calcium 9.3 8.9 - 09.4 mg/dL   GFR calc non Af Amer 44 (L) >60 mL/min   GFR calc Af Amer 50 (L) >60 mL/min   Anion gap 13 5 - 15    Comment: Performed at [facility], [address], [place], [place] [address]  CBC     Status: Abnormal   Collection Time: 10/28/18  7:48 PM  Result Value Ref Range   WBC 9.7 4.0 - 10.5 K/uL   RBC 4.65 4.22 - 5.81 MIL/uL   Hemoglobin 11.5 (  L) 13.0 - 17.0 g/dL   HCT 70.7 (L) 86.7 - 54.4 %   MCV 79.6 (L) 80.0 - 100.0 fL   MCH 24.7 (L) 26.0 - 34.0 pg   MCHC 31.1 30.0 - 36.0 g/dL   RDW 92.0 (H) 10.0 - 71.2 %   Platelets 380 150 - 400 K/uL   nRBC 0.0 0.0 - 0.2 %    Comment: Performed at [facility], [address], [place], [place] [address]  I-stat troponin, ED     Status: Abnormal   Collection Time: 10/28/18  7:53 PM  Result Value Ref Range   Troponin i, poc 0.14 (HH) 0.00 - 0.08 ng/mL   Comment NOTIFIED PHYSICIAN    Comment 3            Comment: Due to the release kinetics of cTnI, a negative result within the first hours of the onset of symptoms does not rule out myocardial infarction with certainty. If myocardial infarction is still suspected, repeat the test at appropriate intervals.   CBC     Status: Abnormal   Collection Time: 10/29/18  3:12 AM  Result Value Ref Range   WBC 9.3 4.0 - 10.5 K/uL   RBC 4.66 4.22 - 5.81  MIL/uL   Hemoglobin 11.3 (L) 13.0 - 17.0 g/dL   HCT 83.2 (L) 54.9 - 82.6 %   MCV 80.7 80.0 - 100.0 fL   MCH 24.2 (L) 26.0 - 34.0 pg   MCHC 30.1 30.0 - 36.0 g/dL   RDW 41.5 (H) 83.0 - 94.0 %   Platelets 336 150 - 400 K/uL   nRBC 0.0 0.0 - 0.2 %    Comment: Performed at [facility], [address] [place], [place] [address]   Ct Knee Right Wo Contrast  Result Date: 10/29/2018 CLINICAL DATA:  Right knee pain and swelling for weeks without known injury. EXAM: CT OF THE RIGHT KNEE WITHOUT CONTRAST TECHNIQUE: Multidetector CT imaging of the RIGHT knee was performed according to the standard protocol. Multiplanar CT image reconstructions were also generated. COMPARISON:  Knee radiographs 10/07/2018 FINDINGS: Bones/Joint/Cartilage Subchondral degenerative cystic change is noted of the tibial plateau more so laterally with degenerative spurring of the tibial spine, tibial plateau and both femoral condyles. The presence of subchondral degenerative change raise suspicion for full thickness chondral loss and or fissuring especially of the lateral tibial plateau cartilage. No acute fracture or suspicious osseous lesions. Moderate to large suprapatellar joint effusion is partially included. Prepatellar bursal fluid is also seen. Soft tissue induration of Hoffa's fat pad is noted, nonspecific. The constellation of these findings may represent a marked synovitis. Ligaments Suboptimally assessed by CT. Slight undulating appearance of the ACL may represent a sprain or tear, series 9/43. MRI may help for further correlation if possible. The course and morphology of the PCL appears largely intact. The collateral ligaments are suboptimally assessed on CT of the expected course of the collateral ligaments appear largely intact. Muscles and Tendons No atrophy.  No popliteal cyst.  Intact popliteus. Soft tissues Prepatellar bursal fluid with soft tissue edema anteriorly. IMPRESSION: 1. Tricompartmental osteoarthritis  greatest along the lateral femorotibial compartment with there are scattered areas of subchondral degenerative cystic change and/or erosions of the lateral tibial plateau. Findings may represent a secondary sign of full thickness chondral fissuring or loss allowing for the erosive/degenerative change. 2. Moderate to large suprapatellar joint effusion is partially included without fat fluid level to suggest an acute fracture. Findings may represent a  synovitis given induration of Hoffa's fat pad. 3. Prepatellar bursal fluid consistent with bursitis or potentially posttraumatic change/remote hematoma. 4. No acute osseous abnormality. Electronically Signed   By: Tollie Eth M.D.   On: 10/29/2018 03:25   Dg Chest Portable 1 View  Result Date: 10/28/2018 CLINICAL DATA:  Right leg pain, chest pain and shortness of breath. History of hypertension. EXAM: PORTABLE CHEST 1 VIEW COMPARISON:  09/07/2018 FINDINGS: Moderate enlargement of the cardiopericardial silhouette, stable. No mediastinal or hilar masses. No convincing adenopathy. Mild prominence of the bronchovascular markings, stable. Lungs otherwise clear. No pleural effusion. No pneumothorax. Skeletal structures are grossly intact. IMPRESSION: 1. No acute cardiopulmonary disease. 2. Moderate cardiomegaly. Electronically Signed   By: Amie Portland M.D.   On: 10/28/2018 20:17    Pending Labs Unresulted Labs (From admission, onward)    Start     Ordered   10/29/18 0500  Comprehensive metabolic panel  Tomorrow morning,   R     10/29/18 0235   10/29/18 0235  TSH  Once,   R     10/29/18 0235   10/29/18 0235  Magnesium  Once,   R     10/29/18 0235   10/29/18 0235  Troponin I - Now Then Q6H  Now then every 6 hours,   R     10/29/18 0235   10/29/18 0235  Sedimentation rate  Once,   R     10/29/18 0235   10/29/18 0235  Uric acid  Once,   R     10/29/18 0235          Vitals/Pain Today's Vitals   10/28/18 2300 10/28/18 2315 10/29/18 0000 10/29/18 0216   BP: (!) 189/130 (!) 174/119 (!) 155/114 (!) 174/118  Pulse: 85 87 86 91  Resp: 18 19 (!) 23 20  Temp:      TempSrc:      SpO2: 100% 100% 100% 99%  Weight:      Height:      PainSc:        Isolation Precautions No active isolations  Medications Medications  sodium chloride flush (NS) 0.9 % injection 3 mL (3 mLs Intravenous Not Given 10/28/18 2222)  diltiazem (CARDIZEM) 100 mg in dextrose 5% (1 mg/mL) infusion (5 mg/hr Intravenous New Bag/Given 10/28/18 2044)  carvedilol (COREG) tablet 25 mg (has no administration in time range)  furosemide (LASIX) tablet 80 mg (has no administration in time range)  hydrALAZINE (APRESOLINE) tablet 50 mg (has no administration in time range)  lisinopril (PRINIVIL,ZESTRIL) tablet 40 mg (40 mg Oral Given 10/29/18 0310)  docusate sodium (COLACE) capsule 100 mg (has no administration in time range)  potassium chloride SA (K-DUR,KLOR-CON) CR tablet 20 mEq (has no administration in time range)  acetaminophen (TYLENOL) tablet 650 mg (has no administration in time range)    Or  acetaminophen (TYLENOL) suppository 650 mg (has no administration in time range)  ondansetron (ZOFRAN) tablet 4 mg (has no administration in time range)    Or  ondansetron (ZOFRAN) injection 4 mg (has no administration in time range)  HYDROcodone-acetaminophen (NORCO/VICODIN) 5-325 MG per tablet 1 tablet (1 tablet Oral Given 10/28/18 2050)  diltiazem (CARDIZEM) injection 20 mg (20 mg Intravenous Given 10/28/18 2111)  potassium chloride SA (K-DUR,KLOR-CON) CR tablet 40 mEq (40 mEq Oral Given 10/29/18 0310)    Mobility walks Low fall risk   Focused Assessments Cardiac Assessment Handoff:  Cardiac Rhythm: Atrial fibrillation Lab Results  Component Value Date   TROPONINI 0.14 (HH)  10/31/2016   No results found for: DDIMER Does the Patient currently have chest pain? No     R Recommendations: See Admitting Provider Note  Report given to:   Additional Notes: c/o right  knee pain , denies chest pain

## 2018-10-29 NOTE — Progress Notes (Signed)
PROGRESS NOTE    Cody [name]  ZYS:[id number] DOB: 06/02/1971 DOA: 10/28/2018 PCP: Storm Frisk, MD    Brief Narrative:  48 year old male who presented with dyspnea.  He does have significant past medical history for atrial fibrillation, hypertension, chronic kidney disease stage III, chronic anemia, history of DVT.  Reported 2 days of dyspnea, worse with exertion, and associated with chest tightness.  Apparently he ran out of his medications 2 days ago.  Has not taken rivaroxaban for the last 3 weeks.  On his initial physical examination his heart rate was 142 bpm, temperature 98.6, blood pressure 181/157, respiratory 27, oxygen saturation 100%.  His lungs are clear to auscultation, heart S1-S2 present, irregularly irregular, abdomen soft, positive right knee edema.  Sodium 138, potassium 3.1, chloride 102, bicarbonate 30, glucose 95, BUN 24, creatinine 1.81, troponin 0.14, white count 9.7, hemoglobin 11.5, hematocrit 37.0, platelets 380.  His chest x-ray had cardiomegaly, vascular congestion and fluid in the right fissure.  His EKG elation, 140 bpm, right axis, left bundle branch block, t wave inversions in the inferior lateral leads with poor R wave progression.  Patient was admitted to the hospital working diagnosis of atrial fibrillation with rapid ventricular response, complicated by decompensated heart failure.    Assessment & Plan:   Principal Problem:   Atrial fibrillation with RVR (HCC) Active Problems:   CKD (chronic kidney disease) stage 3, GFR 30-59 ml/min (HCC)   Obstructive sleep apnea   Hypertensive urgency   A-fib (HCC)   1. Atrial fibrillation with rapid ventricular response, complicated with acute on chronic diastolic heart failure decompensation. Improved rate control with continuous infusion of IV diltiazem at 5 mg per H, will change carvedilol to metoprolol 50 mg po bid and continue to wean off diltiazem drip to target heart rate less than 130 bpm. Continue diuresis  with furosemide, follow urine output. Continue ace inh, blood pressure 140 to 150 systolic. Follow with echocardiogram. Resume anticoagulation with rivaroxaban.   2. Uncontrolled HTN/ urgency. Will continue blood pressure control with hydralazine, 50 mg tid, lisinopril 40 mg daily and metoprolol, diuresis with furosemide.   3. AKI on CKD stage 3 with hypokalemia. Serum cr at 1,8, old records personally reviewed base cr at 1,5. K today at 3,0 and serum bicarbonate at 24, will correct K with kcl and will follow on renal panel in am. Avoid hypotension and nephrotoxic medications.   4. Right knee pain. Pain at the lateral knee joint, no clinical signs of gout, noted elevated lactic acid, but arthritis with no clinical features of gout, no erythema, increase local temperature, no localized pain. Add local voltaren and follow on MRI.   5. Reported history of DVT. Will resume rivaroxaban for anticoagulation.   DVT prophylaxis: rivaroxaban    Code Status:  full Family Communication: no family at the bedside  Disposition Plan/ discharge barriers: pending clinical improvement.   Body mass index is 36.12 kg/m. Malnutrition Type:      Malnutrition Characteristics:      Nutrition Interventions:     RN Pressure Injury Documentation:     Consultants:     Procedures:     Antimicrobials:       Subjective: Dyspnea has improved, but not yet back to baseline, he has not been out of bed yet. No palpitations or chest pain. Positive right knee pain on the lateral aspect. No recent injury or trauma.   Objective: Vitals:   10/29/18 0400 10/29/18 0557 10/29/18 0600 10/29/18 0732  BP: [name])  168/110  (!) 152/112 (!) 148/95  Pulse: (!) 116 68 95 92  Resp: (!) 24 (!) 23 (!) 22 (!) 21  Temp:    97.8 F (36.6 C)  TempSrc:    Oral  SpO2: 96% 100% 100% 97%  Weight:  120.8 kg    Height:  6' (1.829 m)      Intake/Output Summary (Last 24 hours) at 10/29/2018 1112 Last data filed at  10/29/2018 0733 Gross per 24 hour  Intake 308.72 ml  Output 150 ml  Net 158.72 ml   Filed Weights   10/28/18 2008 10/29/18 0557  Weight: 117.9 kg 120.8 kg    Examination:   General: Not in pain or dyspnea, deconditioned  Neurology: Awake and alert, non focal  E ENT: mild pallor, no icterus, oral mucosa moist Cardiovascular: No JVD. S1-S2 present, rhythmic, no gallops, rubs, or murmurs. ++ lower extremity edema. Pulmonary: positive breath sounds bilaterally, adequate air movement, no wheezing, rhonchi or rales. Gastrointestinal. Abdomen protuberant, no organomegaly, non tender, no rebound or guarding Skin. No rashes Musculoskeletal: no joint deformities/ right knee with local edema, tender at the lateral aspect, no erythema or increased local temperature. Decreased range of motion due to pain.      Data Reviewed: I have personally reviewed following labs and imaging studies  CBC: Recent Labs  Lab 10/28/18 1948 10/29/18 0312  WBC 9.7 9.3  HGB 11.5* 11.3*  HCT 37.0* 37.6*  MCV 79.6* 80.7  PLT 380 336   Basic Metabolic Panel: Recent Labs  Lab 10/28/18 1948 10/29/18 0312  NA 138 138  K 3.1* 3.0*  CL 102 102  CO2 23 24  GLUCOSE 95 166*  BUN 24* 23*  CREATININE 1.81* 1.83*  CALCIUM 9.3 9.1  MG  --  1.7   GFR: Estimated Creatinine Clearance: 67 mL/min (A) (by C-G formula based on SCr of 1.83 mg/dL (H)). Liver Function Tests: Recent Labs  Lab 10/29/18 0312  AST 32  ALT 24  ALKPHOS 74  BILITOT 2.4*  PROT 7.0  ALBUMIN 3.6   No results for input(s): LIPASE, AMYLASE in the last 168 hours. No results for input(s): AMMONIA in the last 168 hours. Coagulation Profile: No results for input(s): INR, PROTIME in the last 168 hours. Cardiac Enzymes: Recent Labs  Lab 10/29/18 0312 10/29/18 0918  TROPONINI 0.09* 0.07*   BNP (last 3 results) No results for input(s): PROBNP in the last 8760 hours. HbA1C: No results for input(s): HGBA1C in the last 72 hours. CBG:  No results for input(s): GLUCAP in the last 168 hours. Lipid Profile: No results for input(s): CHOL, HDL, LDLCALC, TRIG, CHOLHDL, LDLDIRECT in the last 72 hours. Thyroid Function Tests: Recent Labs    10/29/18 0312  TSH 1.474   Anemia Panel: No results for input(s): VITAMINB12, FOLATE, FERRITIN, TIBC, IRON, RETICCTPCT in the last 72 hours.    Radiology Studies: I have reviewed all of the imaging during this hospital visit personally     Scheduled Meds: . carvedilol  25 mg Oral BID WC  . docusate sodium  100 mg Oral BID  . furosemide  80 mg Oral BID  . hydrALAZINE  50 mg Oral Q8H  . lisinopril  40 mg Oral Daily  . potassium chloride SA  20 mEq Oral BID  . sodium chloride flush  3 mL Intravenous Once   Continuous Infusions: . diltiazem (CARDIZEM) infusion 5 mg/hr (10/29/18 1000)     LOS: 0 days        Brace Welte  Gerome Apley, MD

## 2018-10-29 NOTE — Progress Notes (Signed)
ANTICOAGULATION CONSULT NOTE - Initial Consult  Pharmacy Consult for xarelto Indication: atrial fibrillation and DVT  No Known Allergies  Patient Measurements: Height: 6' (182.9 cm) Weight: 266 lb 5.1 oz (120.8 kg) IBW/kg (Calculated) : 77.6  Vital Signs: Temp: 97.5 F (36.4 C) (03/13 1116) Temp Source: Oral (03/13 1116) BP: 141/100 (03/13 1116) Pulse Rate: 77 (03/13 1116)  Labs: Recent Labs    10/28/18 1948 10/29/18 0312 10/29/18 0918  HGB 11.5* 11.3*  --   HCT 37.0* 37.6*  --   PLT 380 336  --   CREATININE 1.81* 1.83*  --   TROPONINI  --  0.09* 0.07*    Estimated Creatinine Clearance: 67 mL/min (A) (by C-G formula based on SCr of 1.83 mg/dL (H)).   Medical History: Past Medical History:  Diagnosis Date  . Atrial fibrillation (HCC)   . CKD (chronic kidney disease), stage III (HCC)    [name] 02/20/2016  . DVT (deep venous thrombosis) (HCC) 01/2016   a. RLE - Dx 01/2016 in [name] - Took Xarelto for 30 days - out x 2 wks.  [name]   . Hypertension   . Hypertensive heart disease with CHF (congestive heart failure) [name])     Assessment: 48 year old male with previous history of DVT and atrial fibrillation on chronic xarelto prior to admit. Of note he hasn't taken any xarelto in the past 3 weeks.   It was also stated he was on reduced dose (15mg  daily) prior to admit. Although he does have CKD with scr ~1.8, his crcl is well over 50 ml/min and he should be on full dose. His cbc appears stable and low normal and no bleeding history is noted, will continue with full dose for now.   Goal of Therapy:  Monitor platelets by anticoagulation protocol: Yes   Plan:  Xarelto 20mg  daily  [facility] [name] PharmD., BCPS Clinical Pharmacist 10/29/2018 11:57 AM

## 2018-10-30 ENCOUNTER — Observation Stay (HOSPITAL_COMMUNITY): Payer: Medicaid Other

## 2018-10-30 DIAGNOSIS — I4819 Other persistent atrial fibrillation: Secondary | ICD-10-CM | POA: Diagnosis present

## 2018-10-30 DIAGNOSIS — Z801 Family history of malignant neoplasm of trachea, bronchus and lung: Secondary | ICD-10-CM | POA: Diagnosis not present

## 2018-10-30 DIAGNOSIS — I160 Hypertensive urgency: Secondary | ICD-10-CM | POA: Diagnosis present

## 2018-10-30 DIAGNOSIS — E876 Hypokalemia: Secondary | ICD-10-CM | POA: Diagnosis present

## 2018-10-30 DIAGNOSIS — G4733 Obstructive sleep apnea (adult) (pediatric): Secondary | ICD-10-CM | POA: Diagnosis present

## 2018-10-30 DIAGNOSIS — I447 Left bundle-branch block, unspecified: Secondary | ICD-10-CM | POA: Diagnosis present

## 2018-10-30 DIAGNOSIS — Z8249 Family history of ischemic heart disease and other diseases of the circulatory system: Secondary | ICD-10-CM | POA: Diagnosis not present

## 2018-10-30 DIAGNOSIS — Z7901 Long term (current) use of anticoagulants: Secondary | ICD-10-CM | POA: Diagnosis not present

## 2018-10-30 DIAGNOSIS — D631 Anemia in chronic kidney disease: Secondary | ICD-10-CM | POA: Diagnosis present

## 2018-10-30 DIAGNOSIS — Z825 Family history of asthma and other chronic lower respiratory diseases: Secondary | ICD-10-CM | POA: Diagnosis not present

## 2018-10-30 DIAGNOSIS — I428 Other cardiomyopathies: Secondary | ICD-10-CM | POA: Diagnosis present

## 2018-10-30 DIAGNOSIS — I130 Hypertensive heart and chronic kidney disease with heart failure and stage 1 through stage 4 chronic kidney disease, or unspecified chronic kidney disease: Secondary | ICD-10-CM | POA: Diagnosis present

## 2018-10-30 DIAGNOSIS — Z6835 Body mass index (BMI) 35.0-35.9, adult: Secondary | ICD-10-CM | POA: Diagnosis not present

## 2018-10-30 DIAGNOSIS — I4891 Unspecified atrial fibrillation: Secondary | ICD-10-CM | POA: Diagnosis present

## 2018-10-30 DIAGNOSIS — I5043 Acute on chronic combined systolic (congestive) and diastolic (congestive) heart failure: Secondary | ICD-10-CM | POA: Diagnosis present

## 2018-10-30 DIAGNOSIS — M1711 Unilateral primary osteoarthritis, right knee: Secondary | ICD-10-CM | POA: Diagnosis present

## 2018-10-30 DIAGNOSIS — Z86718 Personal history of other venous thrombosis and embolism: Secondary | ICD-10-CM | POA: Diagnosis not present

## 2018-10-30 DIAGNOSIS — Z9119 Patient's noncompliance with other medical treatment and regimen: Secondary | ICD-10-CM | POA: Diagnosis not present

## 2018-10-30 DIAGNOSIS — Z9114 Patient's other noncompliance with medication regimen: Secondary | ICD-10-CM | POA: Diagnosis not present

## 2018-10-30 DIAGNOSIS — N183 Chronic kidney disease, stage 3 (moderate): Secondary | ICD-10-CM | POA: Diagnosis present

## 2018-10-30 DIAGNOSIS — E669 Obesity, unspecified: Secondary | ICD-10-CM | POA: Diagnosis present

## 2018-10-30 DIAGNOSIS — I5023 Acute on chronic systolic (congestive) heart failure: Secondary | ICD-10-CM | POA: Diagnosis not present

## 2018-10-30 LAB — BASIC METABOLIC PANEL
Anion gap: 10 (ref 5–15)
BUN: 24 mg/dL — ABNORMAL HIGH (ref 6–20)
CO2: 26 mmol/L (ref 22–32)
Calcium: 8.9 mg/dL (ref 8.9–10.3)
Chloride: 101 mmol/L (ref 98–111)
Creatinine, Ser: 1.69 mg/dL — ABNORMAL HIGH (ref 0.61–1.24)
GFR calc Af Amer: 55 mL/min — ABNORMAL LOW (ref >60)
GFR, EST NON AFRICAN AMERICAN: 47 mL/min — AB (ref >60)
Glucose, Bld: 93 mg/dL (ref 70–99)
Potassium: 3.5 mmol/L (ref 3.5–5.1)
Sodium: 137 mmol/L (ref 135–145)

## 2018-10-30 MED ORDER — MORPHINE SULFATE (PF) 2 MG/ML IV SOLN: 2.0000 mg | INTRAVENOUS | Status: DC | PRN

## 2018-10-30 MED ORDER — HYDROCODONE-ACETAMINOPHEN 5-325 MG PO TABS
2.0000 | ORAL_TABLET | ORAL | Status: DC | PRN
  Administered 2018-10-30 – 2018-11-01 (×5): 2 via ORAL
  Filled 2018-10-30 (×5): qty 2

## 2018-10-30 MED ORDER — METOPROLOL TARTRATE 5 MG/5ML IV SOLN: 5.0000 mg | INTRAVENOUS | Status: DC | PRN

## 2018-10-30 MED ORDER — METOPROLOL TARTRATE 50 MG PO TABS
75.0000 mg | ORAL_TABLET | Freq: Two times a day (BID) | ORAL | Status: DC
  Administered 2018-10-30 – 2018-11-01 (×4): 75 mg via ORAL
  Filled 2018-10-30 (×4): qty 1

## 2018-10-30 NOTE — Progress Notes (Signed)
Md notified.  Pt still having right knee pain.   Notes talk about MRI however not ordered.  Pt questioned why taking so long for test if Dr. Rosine Abe know what's going on with his knee.   Provided reassurance. Pain medication given. Will continue to monitor. Karena Addison T

## 2018-10-30 NOTE — Progress Notes (Signed)
Tab metoprolol 75 mg PO given, current HR is 109 to 120, will continue to monitor  Lonia Farber, RN

## 2018-10-30 NOTE — Progress Notes (Signed)
PROGRESS NOTE    Cody [name]  JJO:[id number] DOB: 04-Feb-1971 DOA: 10/28/2018 PCP: Storm Frisk, MD    Brief Narrative:  48 year old male who presented with dyspnea.  He does have significant past medical history for atrial fibrillation, hypertension, chronic kidney disease stage III, chronic anemia, history of DVT.  Reported 2 days of dyspnea, worse with exertion, and associated with chest tightness.  Apparently he ran out of his medications 2 days ago.  Has not taken rivaroxaban for the last 3 weeks.  On his initial physical examination his heart rate was 142 bpm, temperature 98.6, blood pressure 181/157, respiratory 27, oxygen saturation 100%.  His lungs are clear to auscultation, heart S1-S2 present, irregularly irregular, abdomen soft, positive right knee edema.  Sodium 138, potassium 3.1, chloride 102, bicarbonate 30, glucose 95, BUN 24, creatinine 1.81, troponin 0.14, white count 9.7, hemoglobin 11.5, hematocrit 37.0, platelets 380.  His chest x-ray had cardiomegaly, vascular congestion and fluid in the right fissure.  His EKG elation, 140 bpm, right axis, left bundle branch block, t wave inversions in the inferior lateral leads with poor R wave progression.  Patient was admitted to the hospital working diagnosis of atrial fibrillation with rapid ventricular response, complicated by decompensated heart failure.    Assessment & Plan:   Principal Problem:   Atrial fibrillation with RVR (HCC) Active Problems:   CKD (chronic kidney disease) stage 3, GFR 30-59 ml/min (HCC)   Obstructive sleep apnea   Hypertensive urgency   A-fib (HCC)   1. Atrial fibrillation with rapid ventricular response, complicated with acute on chronic systolic heart failure decompensation. Patient now is off diltiazem infusion, HR has been low 100, continue atrial fibrillation, will continue rate control with metoprolol 50 mg po bid, will continue anticoagulation with rivaroxaban. His urine output 1,630 ml over  last 24 H. His LV function has worsened to 20 to 25%, moderately increased left ventricular cavity. Continue diuresis with IV furosemide.   2. Uncontrolled HTN/ urgency. Improved blood pressure with systolic 118 mmHg, will continue with hydralazine and lisinopril.   3. AKI on CKD stage 3 with hypokalemia. Renal function with serum cr at 1,69 with K at 3,5, will continue to follow on renal panel in am, avoid hypotension and nephrotoxic medications, continue diuresis with furosemide.  4. Right knee pain. Right knee MRI with significant degenerative changes, most severe in the lateral compartment. Large complex effusion and synovitis.   5. Reported history of DVT. Patient has been resumed on anticoagulation with rivaroxaban.   6. Obesity. Calculated bmi is 36, will need outpatient follow up.   DVT prophylaxis: rivaroxaban    Code Status:  full Family Communication: no family at the bedside  Disposition Plan/ discharge barriers: pending clinical improvement.    Body mass index is 36.12 kg/m. Malnutrition Type:      Malnutrition Characteristics:      Nutrition Interventions:     RN Pressure Injury Documentation:     Consultants:     Procedures:     Antimicrobials:       Subjective: Patient reports improvement in his dyspnea and edema, continue to have severe right knee pain, has been persistent despite analgesics, no nausea or vomiting.   Objective: Vitals:   10/30/18 1000 10/30/18 1134 10/30/18 1200 10/30/18 1300  BP:  (!) 118/105    Pulse: 81  (!) 115 62  Resp: 13 18 15  (!) 26  Temp:  98 F (36.7 C)    TempSrc:  Oral  SpO2: 99% 98% 100% 97%  Weight:      Height:        Intake/Output Summary (Last 24 hours) at 10/30/2018 1424 Last data filed at 10/30/2018 1258 Gross per 24 hour  Intake 1440 ml  Output 1200 ml  Net 240 ml   Filed Weights   10/28/18 2008 10/29/18 0557  Weight: 117.9 kg 120.8 kg    Examination:   General: deconditioned   Neurology: Awake and alert, non focal  E ENT: mild pallor, no icterus, oral mucosa moist Cardiovascular: No JVD. S1-S2 present, rhythmic, no gallops, rubs, or murmurs. No lower extremity edema. Pulmonary: positive breath sounds bilaterally, adequate air movement, no wheezing, rhonchi or rales. Gastrointestinal. Abdomen protuberant no organomegaly, non tender, no rebound or guarding Skin. No rashes Musculoskeletal: no joint deformities/ right knee edema. Tender to palpation on the lateral aspect.      Data Reviewed: I have personally reviewed following labs and imaging studies  CBC: Recent Labs  Lab 10/28/18 1948 10/29/18 0312  WBC 9.7 9.3  HGB 11.5* 11.3*  HCT 37.0* 37.6*  MCV 79.6* 80.7  PLT 380 336   Basic Metabolic Panel: Recent Labs  Lab 10/28/18 1948 10/29/18 0312 10/30/18 0237  NA 138 138 137  K 3.1* 3.0* 3.5  CL 102 102 101  CO2 23 24 26   GLUCOSE 95 166* 93  BUN 24* 23* 24*  CREATININE 1.81* 1.83* 1.69*  CALCIUM 9.3 9.1 8.9  MG  --  1.7  --    GFR: Estimated Creatinine Clearance: 72.5 mL/min (A) (by C-G formula based on SCr of 1.69 mg/dL (H)). Liver Function Tests: Recent Labs  Lab 10/29/18 0312  AST 32  ALT 24  ALKPHOS 74  BILITOT 2.4*  PROT 7.0  ALBUMIN 3.6   No results for input(s): LIPASE, AMYLASE in the last 168 hours. No results for input(s): AMMONIA in the last 168 hours. Coagulation Profile: No results for input(s): INR, PROTIME in the last 168 hours. Cardiac Enzymes: Recent Labs  Lab 10/29/18 0312 10/29/18 0918 10/29/18 1503  TROPONINI 0.09* 0.07* 0.06*   BNP (last 3 results) No results for input(s): PROBNP in the last 8760 hours. HbA1C: No results for input(s): HGBA1C in the last 72 hours. CBG: No results for input(s): GLUCAP in the last 168 hours. Lipid Profile: No results for input(s): CHOL, HDL, LDLCALC, TRIG, CHOLHDL, LDLDIRECT in the last 72 hours. Thyroid Function Tests: Recent Labs    10/29/18 0312  TSH 1.474    Anemia Panel: No results for input(s): VITAMINB12, FOLATE, FERRITIN, TIBC, IRON, RETICCTPCT in the last 72 hours.    Radiology Studies: I have reviewed all of the imaging during this hospital visit personally     Scheduled Meds: . diclofenac sodium  2 g Topical QID  . docusate sodium  100 mg Oral BID  . furosemide  80 mg Oral BID  . hydrALAZINE  50 mg Oral Q8H  . lisinopril  40 mg Oral Daily  . metoprolol tartrate  50 mg Oral BID  . potassium chloride SA  20 mEq Oral BID  . rivaroxaban  20 mg Oral Q supper  . sodium chloride flush  3 mL Intravenous Once   Continuous Infusions: . diltiazem (CARDIZEM) infusion Stopped (10/29/18 1400)     LOS: 0 days        Mauricio Annett Gula, MD

## 2018-10-30 NOTE — Progress Notes (Addendum)
Pt's HR is upto 160 lowest 100, paged MD Dr Ella Jubilee, waiting for response Pt is asymptomatic, talking over phone, sitting in a chair

## 2018-10-31 DIAGNOSIS — M1711 Unilateral primary osteoarthritis, right knee: Secondary | ICD-10-CM

## 2018-10-31 LAB — BASIC METABOLIC PANEL
ANION GAP: 10 (ref 5–15)
BUN: 28 mg/dL — ABNORMAL HIGH (ref 6–20)
CO2: 28 mmol/L (ref 22–32)
Calcium: 9.3 mg/dL (ref 8.9–10.3)
Chloride: 100 mmol/L (ref 98–111)
Creatinine, Ser: 1.73 mg/dL — ABNORMAL HIGH (ref 0.61–1.24)
GFR calc Af Amer: 53 mL/min — ABNORMAL LOW (ref >60)
GFR calc non Af Amer: 46 mL/min — ABNORMAL LOW (ref >60)
GLUCOSE: 86 mg/dL (ref 70–99)
Potassium: 3.7 mmol/L (ref 3.5–5.1)
Sodium: 138 mmol/L (ref 135–145)

## 2018-10-31 MED ORDER — PREDNISONE 50 MG PO TABS
60.0000 mg | ORAL_TABLET | Freq: Once | ORAL | Status: AC
  Administered 2018-10-31: 60 mg via ORAL
  Filled 2018-10-31: qty 1

## 2018-10-31 MED ORDER — FUROSEMIDE 80 MG PO TABS
80.0000 mg | ORAL_TABLET | Freq: Every day | ORAL | Status: DC
  Administered 2018-11-01 – 2018-11-02 (×2): 80 mg via ORAL
  Filled 2018-10-31 (×2): qty 1

## 2018-10-31 NOTE — Progress Notes (Signed)
Pt ambulated in a hallway twice, no distress, prednisone made him feel little better, pain medicine given around the clock, will continue to monitor the patient  Lonia Farber, RN

## 2018-10-31 NOTE — Progress Notes (Signed)
PROGRESS NOTE    Cody [name]  WYO:[id number] DOB: June 08, 1971 DOA: 10/28/2018 PCP: Storm Frisk, MD    Brief Narrative:  47 year old male who presented with dyspnea. He does have significant past medical history for atrial fibrillation, hypertension, chronic kidney disease stage III, chronic anemia, history of DVT. Reported 2 days of dyspnea, worse with exertion, andassociated with chest tightness.Apparently he ran out of his medications 2 days ago. Has not taken rivaroxaban for the last 3 weeks. On his initial physical examination his heart rate was 142 bpm, temperature 98.6,blood pressure 181/157, respiratory 27, oxygen saturation 100%.His lungs are clear to auscultation, heart S1-S2 present, irregularly irregular, abdomen soft, positive right knee edema. Sodium 138, potassium 3.1, chloride 102, bicarbonate 30, glucose 95, BUN 24, creatinine 1.81, troponin 0.14,white count 9.7, hemoglobin 11.5, hematocrit 37.0, platelets 380.His chest x-ray had cardiomegaly, vascular congestion and fluid in the right fissure.His EKG elation, 140 bpm, right axis, left bundle branch block, t waveinversions in the inferior lateral leads with poor R wave progression.  Patient was admitted to the hospital working diagnosis of atrial fibrillation with rapid ventricular response,complicated by decompensated heart failure.   Assessment & Plan:   Principal Problem:   Atrial fibrillation with RVR (HCC) Active Problems:   CKD (chronic kidney disease) stage 3, GFR 30-59 ml/min (HCC)   Obstructive sleep apnea   Hypertensive urgency   A-fib (HCC)  1. Atrial fibrillation with rapid ventricular response, complicated with acute on chronic systolic heart failure decompensation/ LV systolic function 20- 25% with moderately increased left ventricular cavity. Had to increase dose of metoprolol due to persistent RVR. Now on 75 mg po BID, with improved rate control, continue anticoagulation with  rivaroxaban.  His urine output 3,200 ml over last 24 H. Continue ace inh and hydralazine.   2. Uncontrolled HTN/ urgency. Continue blood pressure control with hydralazine and metoprolol, blood pressure 137 and 159 mmHg.  3. AKI on CKD stage 3 with hypokalemia.  Stable renal function with serum cr at 1,7, with K at 3,7, will continue with diuresis, furosemide 80 mg daily to avoid overdiuresis. Follow up renal panel in am.   4. Right knee pain..  Right knee MRI with significant degenerative changes, most severe in the lateral compartment. Large complex effusion and synovitis. Over the phone conversation with orthopedics, will continue conservative management, pain control and will add OT/PT may need a knee brace. Will do trial of one dose of prednisone.   5. Reported history of DVT. Continue rivaroxaban for anticoagulation.   6. Obesity. Calculated bmi is 36. Needs life style modification.    DVT prophylaxis:rivaroxaban Code Status:full Family Communication:no family at the bedside Disposition Plan/ discharge barriers:pending clinical improvement.   Body mass index is 36.12 kg/m. Malnutrition Type:      Malnutrition Characteristics:      Nutrition Interventions:     RN Pressure Injury Documentation:     Consultants:     Procedures:     Antimicrobials:       Subjective: Patient continue to have right knee pain, worse with movement, sharp in nature. Metoprolol was increased due to uncontrolled tachycardia.   Objective: Vitals:   10/31/18 0300 10/31/18 0524 10/31/18 0719 10/31/18 0900  BP: 134/71 (!) 136/109 (!) 159/110   Pulse: 87  100   Resp: 15  20 17   Temp: 98.6 F (37 C)  98.9 F (37.2 C)   TempSrc: Oral  Oral   SpO2: 99%  96%   Weight:  Height:        Intake/Output Summary (Last 24 hours) at 10/31/2018 1111 Last data filed at 10/31/2018 0900 Gross per 24 hour  Intake 960 ml  Output 2950 ml  Net -1990 ml   Filed Weights    10/28/18 2008 10/29/18 0557  Weight: 117.9 kg 120.8 kg    Examination:   General: deconditioned  Neurology: Awake and alert, non focal  E ENT: no pallor, no icterus, oral mucosa moist Cardiovascular: No JVD. S1-S2 present,irregularly irregular, no gallops, rubs, or murmurs. No lower extremity edema. Pulmonary: positive breath sounds bilaterally, adequate air movement, no wheezing, rhonchi or rales. Gastrointestinal. Abdomen with no organomegaly, non tender, no rebound or guarding Skin. No rashes Musculoskeletal: right knee edema, with decreased range of motion.      Data Reviewed: I have personally reviewed following labs and imaging studies  CBC: Recent Labs  Lab 10/28/18 1948 10/29/18 0312  WBC 9.7 9.3  HGB 11.5* 11.3*  HCT 37.0* 37.6*  MCV 79.6* 80.7  PLT 380 336   Basic Metabolic Panel: Recent Labs  Lab 10/28/18 1948 10/29/18 0312 10/30/18 0237 10/31/18 0215  NA 138 138 137 138  K 3.1* 3.0* 3.5 3.7  CL 102 102 101 100  CO2 23 24 26 28   GLUCOSE 95 166* 93 86  BUN 24* 23* 24* 28*  CREATININE 1.81* 1.83* 1.69* 1.73*  CALCIUM 9.3 9.1 8.9 9.3  MG  --  1.7  --   --    GFR: Estimated Creatinine Clearance: 70.9 mL/min (A) (by C-G formula based on SCr of 1.73 mg/dL (H)). Liver Function Tests: Recent Labs  Lab 10/29/18 0312  AST 32  ALT 24  ALKPHOS 74  BILITOT 2.4*  PROT 7.0  ALBUMIN 3.6   No results for input(s): LIPASE, AMYLASE in the last 168 hours. No results for input(s): AMMONIA in the last 168 hours. Coagulation Profile: No results for input(s): INR, PROTIME in the last 168 hours. Cardiac Enzymes: Recent Labs  Lab 10/29/18 0312 10/29/18 0918 10/29/18 1503  TROPONINI 0.09* 0.07* 0.06*   BNP (last 3 results) No results for input(s): PROBNP in the last 8760 hours. HbA1C: No results for input(s): HGBA1C in the last 72 hours. CBG: No results for input(s): GLUCAP in the last 168 hours. Lipid Profile: No results for input(s): CHOL, HDL,  LDLCALC, TRIG, CHOLHDL, LDLDIRECT in the last 72 hours. Thyroid Function Tests: Recent Labs    10/29/18 0312  TSH 1.474   Anemia Panel: No results for input(s): VITAMINB12, FOLATE, FERRITIN, TIBC, IRON, RETICCTPCT in the last 72 hours.    Radiology Studies: I have reviewed all of the imaging during this hospital visit personally     Scheduled Meds: . diclofenac sodium  2 g Topical QID  . docusate sodium  100 mg Oral BID  . furosemide  80 mg Oral BID  . hydrALAZINE  50 mg Oral Q8H  . lisinopril  40 mg Oral Daily  . metoprolol tartrate  75 mg Oral BID  . potassium chloride SA  20 mEq Oral BID  . rivaroxaban  20 mg Oral Q supper  . sodium chloride flush  3 mL Intravenous Once   Continuous Infusions: . diltiazem (CARDIZEM) infusion Stopped (10/29/18 1400)     LOS: 1 day         Annett Gula, MD

## 2018-11-01 DIAGNOSIS — I482 Chronic atrial fibrillation, unspecified: Secondary | ICD-10-CM

## 2018-11-01 DIAGNOSIS — I5023 Acute on chronic systolic (congestive) heart failure: Secondary | ICD-10-CM

## 2018-11-01 LAB — BASIC METABOLIC PANEL
Anion gap: 9 (ref 5–15)
BUN: 28 mg/dL — ABNORMAL HIGH (ref 6–20)
CO2: 26 mmol/L (ref 22–32)
Calcium: 9.6 mg/dL (ref 8.9–10.3)
Chloride: 102 mmol/L (ref 98–111)
Creatinine, Ser: 1.43 mg/dL — ABNORMAL HIGH (ref 0.61–1.24)
GFR calc Af Amer: >60 mL/min (ref >60)
GFR calc non Af Amer: 58 mL/min — ABNORMAL LOW (ref >60)
GLUCOSE: 135 mg/dL — AB (ref 70–99)
Potassium: 4.3 mmol/L (ref 3.5–5.1)
Sodium: 137 mmol/L (ref 135–145)

## 2018-11-01 MED ORDER — PREDNISONE 20 MG PO TABS
20.0000 mg | ORAL_TABLET | Freq: Every day | ORAL | Status: DC
  Administered 2018-11-01 – 2018-11-02 (×2): 20 mg via ORAL
  Filled 2018-11-01 (×2): qty 1

## 2018-11-01 MED ORDER — FUROSEMIDE 10 MG/ML IJ SOLN
80.0000 mg | Freq: Once | INTRAMUSCULAR | Status: AC
  Administered 2018-11-01: 80 mg via INTRAVENOUS
  Filled 2018-11-01: qty 8

## 2018-11-01 MED ORDER — CARVEDILOL 25 MG PO TABS
25.0000 mg | ORAL_TABLET | Freq: Two times a day (BID) | ORAL | Status: DC
  Administered 2018-11-01 – 2018-11-02 (×2): 25 mg via ORAL
  Filled 2018-11-01 (×2): qty 1

## 2018-11-01 NOTE — Consult Note (Addendum)
CARDIOLOGY CONSULT NOTE  Patient ID: Cody [name] MRN: [id number] DOB/AGE: 1970-08-29 48 y.o.  Admit date: 10/28/2018 Referring Physician: [facility] Primary Physician:  Storm Frisk, MD Reason for Consultation  Afib, HFrEF  HPI:   48 y.o. African American male  with hypertension, untreated OSA, h/o RLE DVT (2017), persistent Afib, CKD3, admitted with shortness of breath on 10/29/2018. Workup so far has showed Afib RVR, new diagnosis of EF 20-25% on echocardiogram. EF previously normal in 2017. Trop mildly elevated and flat.   Patient lives in [place] with his mother and two children. He has had progressively worsening dyspnea for a long time. He has had several ED/hospital admissions with similar complaints. There is documentation of EF 30% on D/C summary in 07/2018, although I do not see an echocardiogram performed since 2017. He has not consistently been on anticoagulation.  Currently, taking Xarelto 20 mg daily.  Most recently, he has been on carvedilol 25 mg twice daily, lisinopril 40 mg once daily, hydralazine 50 mg 3 times daily. During this hospitalization, he has been on metoprolol tartarate 75 mg bid.   Past Medical History:  Diagnosis Date  . Atrial fibrillation (HCC)   . CKD (chronic kidney disease), stage III (HCC)    [name] 02/20/2016  . DVT (deep venous thrombosis) (HCC) 01/2016   a. RLE - Dx 01/2016 in [name] - Took Xarelto for 30 days - out x 2 wks.  [name]   . Hypertension   . Hypertensive heart disease with CHF (congestive heart failure) (HCC)      Past Surgical History:  Procedure Laterality Date  . NO PAST SURGERIES       Family History  Problem Relation Age of Onset  . Asthma Mother        alive and well  . Hypertension Father        alive  . Lung cancer Father   . Cancer Father      Social History: Social History   Socioeconomic History  . Marital status: Single    Spouse name: Not on file  . Number of children: Not on file  .  Years of education: Not on file  . Highest education level: Not on file  Occupational History  . Not on file  Social Needs  . Financial resource strain: Not on file  . Food insecurity:    Worry: Not on file    Inability: Not on file  . Transportation needs:    Medical: Not on file    Non-medical: Not on file  Tobacco Use  . Smoking status: Never Smoker  . Smokeless tobacco: Never Used  Substance and Sexual Activity  . Alcohol use: No  . Drug use: No  . Sexual activity: Yes  Lifestyle  . Physical activity:    Days per week: Not on file    Minutes per session: Not on file  . Stress: Not on file  Relationships  . Social connections:    Talks on phone: Not on file    Gets together: Not on file    Attends religious service: Not on file    Active member of club or organization: Not on file    Attends meetings of clubs or organizations: Not on file    Relationship status: Not on file  . Intimate partner violence:    Fear of current or ex partner: Not on file    Emotionally abused: Not on file    Physically abused: Not on file  Forced sexual activity: Not on file  Other Topics Concern  . Not on file  Social History Narrative   Lives in [place].  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     Medications Prior to Admission  Medication Sig Dispense Refill Last Dose  . acetaminophen (TYLENOL) 650 MG CR tablet Take 1,300 mg by mouth every 12 (twelve) hours as needed for pain.    Past Week at Unknown time  . carvedilol (COREG) 25 MG tablet Take 1 tablet (25 mg total) by mouth 2 (two) times daily with a meal. 60 tablet 0 10/27/2018 at 1800  . furosemide (LASIX) 40 MG tablet Take 2 tablets (80 mg total) by mouth 2 (two) times daily. 120 tablet 6 10/26/2018 at pm  . hydrALAZINE (APRESOLINE) 50 MG tablet Take 1 tablet (50 mg total) by mouth 3 (three) times daily. 90 tablet 6 Past Month at Unknown time  . lisinopril (PRINIVIL,ZESTRIL) 40 MG tablet Take 1 tablet  (40 mg total) by mouth daily. 30 tablet 0 10/26/2018 at Unknown time  . metolazone (ZAROXOLYN) 2.5 MG tablet Take 1 tablet (2.5 mg total) by mouth daily. 30 tablet 6 Past Week at Unknown time  . potassium chloride SA (K-DUR,KLOR-CON) 20 MEQ tablet Take 1 tablet (20 mEq total) by mouth 2 (two) times daily. 60 tablet 6 10/26/2018 at Unknown time  . Rivaroxaban (XARELTO) 15 MG TABS tablet Take 1 tablet (15 mg total) by mouth daily with supper. 30 tablet 6 10/07/2018 at 1800  . docusate sodium (COLACE) 50 MG capsule Take 1 capsule (50 mg total) by mouth 2 (two) times daily. (Patient not taking: Reported on 10/28/2018) 60 capsule 6 Not Taking at Unknown time  . HYDROcodone-acetaminophen (NORCO/VICODIN) 5-325 MG tablet Take 1-2 tablets by mouth every 6 (six) hours as needed for moderate pain. (Patient not taking: Reported on 10/28/2018) 10 tablet 0 Not Taking at Unknown time    Review of Systems  Constitution: Negative for decreased appetite, malaise/fatigue, weight gain and weight loss.  HENT: Negative for congestion.   Eyes: Negative for visual disturbance.  Cardiovascular: Positive for dyspnea on exertion, leg swelling (R>L) and palpitations. Negative for chest pain (Stable, chronic) and syncope.  Respiratory: Positive for shortness of breath.   Endocrine: Negative for cold intolerance.  Hematologic/Lymphatic: Does not bruise/bleed easily.  Skin: Negative for itching and rash.  Musculoskeletal: Negative for myalgias.  Gastrointestinal: Negative for abdominal pain, nausea and vomiting.  Genitourinary: Negative for dysuria.  Neurological: Negative for dizziness and weakness.  Psychiatric/Behavioral: The patient is not nervous/anxious.   All other systems reviewed and are negative.     Physical Exam: Physical Exam  Constitutional: He is oriented to person, place, and time. He appears well-developed and well-nourished. No distress.  HENT:  Head: Normocephalic and atraumatic.  Eyes: Pupils are  equal, round, and reactive to light. Conjunctivae are normal.  Neck: No JVD present.  Cardiovascular: Normal rate, regular rhythm and intact distal pulses.  Murmur (III/VI holosystolic mitral and tricuspid area) heard. Rapid increase in heart rate with minimal activity.   Pulmonary/Chest: Effort normal and breath sounds normal. He has no wheezes. He has no rales.  Abdominal: Soft. Bowel sounds are normal. There is no rebound.  Musculoskeletal:        General: Edema (R>L) present.  Lymphadenopathy:    He has no cervical adenopathy.  Neurological: He is alert and oriented to person, place, and time. No cranial nerve deficit.  Skin: Skin is  warm and dry.  Psychiatric: He has a normal mood and affect.  Nursing note and vitals reviewed.    Labs:   Lab Results  Component Value Date   WBC 9.3 10/29/2018   HGB 11.3 (L) 10/29/2018   HCT 37.6 (L) 10/29/2018   MCV 80.7 10/29/2018   PLT 336 10/29/2018    Recent Labs  Lab 10/29/18 0312  11/01/18 0225  NA 138   < > 137  K 3.0*   < > 4.3  CL 102   < > 102  CO2 24   < > 26  BUN 23*   < > 28*  CREATININE 1.83*   < > 1.43*  CALCIUM 9.1   < > 9.6  PROT 7.0  --   --   BILITOT 2.4*  --   --   ALKPHOS 74  --   --   ALT 24  --   --   AST 32  --   --   GLUCOSE 166*   < > 135*   < > = values in this interval not displayed.    Lipid Panel  No results found for: CHOL, TRIG, HDL, CHOLHDL, VLDL, LDLCALC  BNP (last 3 results) Recent Labs    07/17/18 1644  BNP 814.4*    HEMOGLOBIN A1C Lab Results  Component Value Date   HGBA1C 6.0 (H) 01/27/2017   MPG 126 01/27/2017    Cardiac Panel (last 3 results) Recent Labs    10/29/18 0312 10/29/18 0918 10/29/18 1503  TROPONINI 0.09* 0.07* 0.06*    Lab Results  Component Value Date   TROPONINI 0.06 (HH) 10/29/2018     TSH Recent Labs    10/29/18 0312  TSH 1.474      Radiology: No results found.  Scheduled Meds: . diclofenac sodium  2 g Topical QID  . docusate sodium   100 mg Oral BID  . furosemide  80 mg Oral Daily  . hydrALAZINE  50 mg Oral Q8H  . lisinopril  40 mg Oral Daily  . metoprolol tartrate  75 mg Oral BID  . potassium chloride SA  20 mEq Oral BID  . predniSONE  20 mg Oral Q breakfast  . rivaroxaban  20 mg Oral Q supper  . sodium chloride flush  3 mL Intravenous Once   Continuous Infusions: . diltiazem (CARDIZEM) infusion Stopped (10/29/18 1400)   PRN Meds:.acetaminophen **OR** acetaminophen, hydrALAZINE, HYDROcodone-acetaminophen, metoprolol tartrate, morphine injection, ondansetron **OR** ondansetron (ZOFRAN) IV, traMADol  CARDIAC STUDIES:  EKG 10/28/2018: Afib. LBBB  Echocardiogram 10/29/2018:  1. The left ventricle has severely reduced systolic function, with an ejection fraction of 20-25%. The cavity size was mildly dilated. There is moderately increased left ventricular wall thickness. Left ventricular diastolic function could not be  evaluated secondary to atrial fibrillation.  2. The right ventricle has moderately reduced systolic function. The cavity was normal. There is mildly increased right ventricular wall thickness.  3. Left atrial size was moderately dilated.  4. The aortic valve is tricuspid Aortic valve regurgitation is trivial by color flow Doppler.  5. The aortic root and ascending aorta are normal in size and structure.  6. The inferior vena cava was dilated in size with <50% respiratory variability.  7. The interatrial septum was not assessed.   Assessment & Recommendations:  48 y.o. African American male  with hypertension, possible OSA, h/o RLE DVT (2017), persistent Afib, CKD3, now with new diagnosis of HFrEF, likely acute on chronic.   Acute on chronic  systolic heart failure: Systolic heart failure has likely been present for long duration.  Etiology for chronic systolic heart failure could include ischemic or nonischemic cardiomyopathy, hypertensive cardiomyopathy, as well as amyloidosis.  He will need  ischemic evaluation, as well as evaluation for amyloidosis.  Outpatient can be performed outpatient. Mild troponin elevation is chronic and does not represent acute coronary syndrome.  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  He understands that he may have significant restrictions during this work.  However, he bleeds to let him go for orientation tomorrow morning, after which he will be able to seek short-term disability.  While this is not ideal, it may be reasonable as long as patient can ambulate without significant dyspnea and tachycardia.  While he does have right leg edema, overall appears relatively euvolemic.  I think his tachycardia with minimal activity is due to the fact that he is not on his carvedilol 25 mg twice daily, which is his home medication. Resume carvedilol 25 mg twice daily.  Stop diltiazem drip, contraindicated in EF less than 30%.Conitnue lisinopril 40 mg daily dow now, will switch to [facility] outpatient. Continue hydralazine 50 mg tid for now. Will switch to [facility] outpatient.   Afib w/RVR: Persistent Afib. Do not recommend cardioversion at this time, as he has not been consistently on anticoagulation. Continue Xarelto 20 mg daily for now. Will consider this outpatient in future. Resume carvedilol, as above. If rate not controlled, add Amiodarone PO 400 mg tid. Will titrate this down outpatient.  Needs outpatient sleep study and treatment for OSA.  H/o RLE DVT: Recommend RLE [place] to rule out recurrence.   Elder Negus, MD 11/01/2018, 3:11 PM [facility] Pager: [phone] Office: [phone] If no answer Cell [phone]

## 2018-11-01 NOTE — Progress Notes (Signed)
Pt has complained multiple times about right knee pain, states that the only thing that has helped was the prednisone he took yesterday. PRN pain medication is ordered and was offered by RN to the patient, but patient refused stating he doesn't see the need in taking something that doesn't help. Primary MD paged about pts request for another dose of steriods and about pts questions on getting out of here possibly today or tomorrow morning, no return phone call at this time. Will monitor.

## 2018-11-01 NOTE — Progress Notes (Signed)
OT Cancellation Note and Discharge  Patient Details Name: Cody [name] MRN: [id number] DOB: [birth year]   Cancelled Treatment:    Reason Eval/Treat Not Completed: OT screened, no needs identified, will sign off. Received text chat from PT, see their note.  Ignacia Palma, OTR/L Acute Rehab Services Pager [phone] Office [phone]    Evette Georges 11/01/2018, 2:41 PM

## 2018-11-01 NOTE — Progress Notes (Signed)
PT Cancellation Note  Patient Details Name: Cody [name] MRN: [id number] DOB: 1970-09-23   Cancelled Treatment:    Reason Eval/Treat Not Completed: PT screened, no needs identified, will sign off. Pt reports rt knee pain that improved with steroids yesterday. Reports he has been mobilizing and his only limitation is his knee pain. Recommend follow up with outpatient ortho.   Angelina Ok [place] 11/01/2018, 2:23 PM Skip Mayer PT Acute Rehabilitation Services Pager [phone] Office [phone]

## 2018-11-01 NOTE — Progress Notes (Signed)
PROGRESS NOTE        PATIENT DETAILS Name: Cody [name] Age: 48 y.o. Sex: male Date of Birth: 1970/08/20 Admit Date: 10/28/2018 Admitting Physician Eduard Clos, MD [id number], Charlcie Cradle, MD  Brief Narrative: Patient is a 48 y.o. male with past medical history significant for atrial fibrillation, hypertension, CKD stage III, chronic anemia, and systolic CHF, who presented to the ED with chief complaint of heart palpitations, lightheadedness, chest pressure and shortness of breath. He reports that these symptoms are similar to when he goes into A Fib. He ran out of his blood pressure medications two days ago, and has not taken rivaroxaban for the past 3 weeks. He also complains of atraumatic, right knee pain and swelling. He had an outpatient appointment scheduled with orthopedics next week however due to his severe pain, was advised to go to the ED. In ED, patient found to be hypertensive and tachycardic. EKG confirmed atrial fibrillation with RVR. CBC with mild anemia. Troponin elevated at 0.14, however he frequently has elevated troponins. CXR negative for pulmonary edema. Patient was admitted to the hospital for further evaluation and treatment of atrial fibrillation with RVR.   Subjective: Patient is sitting up comfortably, eating his breakfast. He reports his shortness of breath and chest pressure have improved since admission. He still complains of right knee pain however it has improved some with Prednisone. He denies chest pain, SOB, nausea, vomiting, or abdominal pain.  Assessment/Plan: Principal Problem:   Atrial fibrillation with RVR (HCC) Active Problems:   CKD (chronic kidney disease) stage 3, GFR 30-59 ml/min (HCC)   Obstructive sleep apnea   Hypertensive urgency   A-fib (HCC)  1. Atrial fibrillation with RVR, complicated by acute on chronic systolic heart failure decompensation with EF of 20-25% -Improved rate control, with increased dose of  po metoprolol to 75mg  bid. HR still elevated in 90-100s. Will consult cardiology for evaluation for possible cardioversion   -Continue anticoagulation with rivaroxaban  -Patient is still volume overloaded with 2+ pitting edema of right lower extremity., 1+ pitting edema of left lower extremity. Continue po lasix qd  -Continue lisinopril, hydralazine   2. Hypertensive urgency -SBP 157, stable. Will continue blood pressure control with hydralazine and metoprolol.   3. AKI, CKD stage III with hypokalemia -Renal function improved, Cr down to 1.43. K stable, at 4.3. Continue diuresis with po lasix 80mg  qd. Monitor with renal panel    4. Right knee pain -MRI performed 3/14 revealed tricompartmental degenerative changes, most severe in the lateral compartment, large complex joint effusion and synovitis, and severely degenerated and torn lateral meniscus. Patient given one dose of prednisone which provided him with significant relief.  -Pain control with tramadol, morphine as needed  -Will need outpatient follow-up with orthopedics  -PT, OT consult   5. OSA -Follow-up as outpatient   6. Obesity  -Calculated BMI 36.12. Follow-up on outpatient basis  Telemetry (independently reviewed):atrial fibrillation   Echo (reviewed):EF 20-25% on TTE done on 10/29/18  Morning labs/Imaging ordered: yes  DVT Prophylaxis: Full dose anticoagulation Xarelto  Code Status: Full code  Family Communication: None  Disposition Plan: Remain inpatient pending clinical improvement   Antimicrobial agents: Anti-infectives (From admission, onward)   None      MEDICATIONS: Scheduled Meds:  diclofenac sodium  2 g Topical QID   docusate sodium  100 mg Oral BID  furosemide  80 mg Oral Daily   hydrALAZINE  50 mg Oral Q8H   lisinopril  40 mg Oral Daily   metoprolol tartrate  75 mg Oral BID   potassium chloride SA  20 mEq Oral BID   rivaroxaban  20 mg Oral Q supper   sodium chloride flush  3 mL  Intravenous Once   Continuous Infusions:  diltiazem (CARDIZEM) infusion Stopped (10/29/18 1400)   PRN Meds:.acetaminophen **OR** acetaminophen, hydrALAZINE, HYDROcodone-acetaminophen, metoprolol tartrate, morphine injection, ondansetron **OR** ondansetron (ZOFRAN) IV, traMADol   PHYSICAL EXAM: Vital signs: Vitals:   11/01/18 0000 11/01/18 0500 11/01/18 0616 11/01/18 0809  BP:  (!) 154/106 (!) 154/106 (!) 157/95  Pulse:      Resp: 12 17    Temp:  97.8 F (36.6 C)  (!) 97.5 F (36.4 C)  TempSrc:  Oral  Oral  SpO2:      Weight:      Height:       Filed Weights   10/28/18 2008 10/29/18 0557  Weight: 117.9 kg 120.8 kg   Body mass index is 36.12 kg/m.   General: Awake, alert, not in any distress. Appears older than stated age  HEENT: Atraumatic. Oral mucosa moist. No scleral icterus  Pulm: Good air entry bilaterally, no wheezing, rhonchi, or rales  CVS: Irregular. No murmurs, rubs, or gallops  GI: Non tender and not distended  Extremities: 2+ pitting edema of right lower extremity, 1+ pitting edema of left lower extremity  Neurology: Speech clear, sensation grossly intac Psychiatric: Normal judgment and insight Musculoskeletal: Right knee edema, tender to palpation  Skin: No rash   LABORATORY DATA: CBC: Recent Labs  Lab 10/28/18 1948 10/29/18 0312  WBC 9.7 9.3  HGB 11.5* 11.3*  HCT 37.0* 37.6*  MCV 79.6* 80.7  PLT 380 336    Basic Metabolic Panel: Recent Labs  Lab 10/28/18 1948 10/29/18 0312 10/30/18 0237 10/31/18 0215 11/01/18 0225  NA 138 138 137 138 137  K 3.1* 3.0* 3.5 3.7 4.3  CL 102 102 101 100 102  CO2 23 24 26 28 26   GLUCOSE 95 166* 93 86 135*  BUN 24* 23* 24* 28* 28*  CREATININE 1.81* 1.83* 1.69* 1.73* 1.43*  CALCIUM 9.3 9.1 8.9 9.3 9.6  MG  --  1.7  --   --   --     GFR: Estimated Creatinine Clearance: 85.7 mL/min (A) (by C-G formula based on SCr of 1.43 mg/dL (H)).  Liver Function Tests: Recent Labs  Lab 10/29/18 0312  AST 32    ALT 24  ALKPHOS 74  BILITOT 2.4*  PROT 7.0  ALBUMIN 3.6   No results for input(s): LIPASE, AMYLASE in the last 168 hours. No results for input(s): AMMONIA in the last 168 hours.  Coagulation Profile: No results for input(s): INR, PROTIME in the last 168 hours.  Cardiac Enzymes: Recent Labs  Lab 10/29/18 0312 10/29/18 0918 10/29/18 1503  TROPONINI 0.09* 0.07* 0.06*    BNP (last 3 results) No results for input(s): PROBNP in the last 8760 hours.  HbA1C: No results for input(s): HGBA1C in the last 72 hours.  CBG: No results for input(s): GLUCAP in the last 168 hours.  Lipid Profile: No results for input(s): CHOL, HDL, LDLCALC, TRIG, CHOLHDL, LDLDIRECT in the last 72 hours.  Thyroid Function Tests: No results for input(s): TSH, T4TOTAL, FREET4, T3FREE, THYROIDAB in the last 72 hours.  Anemia Panel: No results for input(s): VITAMINB12, FOLATE, FERRITIN, TIBC, IRON, RETICCTPCT in the last 72  hours.  Urine analysis:    Component Value Date/Time   LABSPEC >=1.030 01/27/2017 1343   PHURINE 6.5 01/27/2017 1343   GLUCOSEU NEGATIVE 01/27/2017 1343   HGBUR TRACE (A) 01/27/2017 1343   BILIRUBINUR negative 07/27/2018 1530   KETONESUR negative 07/27/2018 1530   KETONESUR NEGATIVE 01/27/2017 1343   PROTEINUR >=300 (A) 01/27/2017 1343   UROBILINOGEN 1.0 07/27/2018 1530   UROBILINOGEN 2.0 (H) 01/27/2017 1343   NITRITE Negative 07/27/2018 1530   NITRITE NEGATIVE 01/27/2017 1343   LEUKOCYTESUR Negative 07/27/2018 1530    Sepsis Labs: Lactic Acid, Venous No results found for: LATICACIDVEN  MICROBIOLOGY: Recent Results (from the past 240 hour(s))  MRSA PCR Screening     Status: None   Collection Time: 10/29/18  5:32 AM  Result Value Ref Range Status   MRSA by PCR NEGATIVE NEGATIVE Final    Comment:        The GeneXpert MRSA Assay (FDA approved for NASAL specimens only), is one component of a comprehensive MRSA colonization surveillance program. It is not intended to  diagnose MRSA infection nor to guide or monitor treatment for MRSA infections. Performed at [facility], [address] [place], [place] [address]     RADIOLOGY STUDIES/RESULTS: Ct Knee Right Wo Contrast  Result Date: 10/29/2018 CLINICAL DATA:  Right knee pain and swelling for weeks without known injury. EXAM: CT OF THE RIGHT KNEE WITHOUT CONTRAST TECHNIQUE: Multidetector CT imaging of the RIGHT knee was performed according to the standard protocol. Multiplanar CT image reconstructions were also generated. COMPARISON:  Knee radiographs 10/07/2018 FINDINGS: Bones/Joint/Cartilage Subchondral degenerative cystic change is noted of the tibial plateau more so laterally with degenerative spurring of the tibial spine, tibial plateau and both femoral condyles. The presence of subchondral degenerative change raise suspicion for full thickness chondral loss and or fissuring especially of the lateral tibial plateau cartilage. No acute fracture or suspicious osseous lesions. Moderate to large suprapatellar joint effusion is partially included. Prepatellar bursal fluid is also seen. Soft tissue induration of Hoffa's fat pad is noted, nonspecific. The constellation of these findings may represent a marked synovitis. Ligaments Suboptimally assessed by CT. Slight undulating appearance of the ACL may represent a sprain or tear, series 9/43. MRI may help for further correlation if possible. The course and morphology of the PCL appears largely intact. The collateral ligaments are suboptimally assessed on CT of the expected course of the collateral ligaments appear largely intact. Muscles and Tendons No atrophy.  No popliteal cyst.  Intact popliteus. Soft tissues Prepatellar bursal fluid with soft tissue edema anteriorly. IMPRESSION: 1. Tricompartmental osteoarthritis greatest along the lateral femorotibial compartment with there are scattered areas of subchondral degenerative cystic change and/or erosions of the  lateral tibial plateau. Findings may represent a secondary sign of full thickness chondral fissuring or loss allowing for the erosive/degenerative change. 2. Moderate to large suprapatellar joint effusion is partially included without fat fluid level to suggest an acute fracture. Findings may represent a synovitis given induration of Hoffa's fat pad. 3. Prepatellar bursal fluid consistent with bursitis or potentially posttraumatic change/remote hematoma. 4. No acute osseous abnormality. Electronically Signed   By: Tollie Eth M.D.   On: 10/29/2018 03:25   Mr Knee Right Wo Contrast  Result Date: 10/30/2018 CLINICAL DATA:  Knee pain and swelling. EXAM: MRI OF THE RIGHT KNEE WITHOUT CONTRAST TECHNIQUE: Multiplanar, multisequence MR imaging of the knee was performed. No intravenous contrast was administered. COMPARISON:  CT scan 10/29/2018 FINDINGS: MENISCI Medial meniscus:  Degenerative  changes but no obvious tear. Lateral meniscus:  Severely degenerated and torn. LIGAMENTS Cruciates:  Intact Collaterals:  Intact CARTILAGE Patellofemoral:  Moderate degenerative chondrosis. Medial: Moderate degenerative chondrosis. Early spurring and subchondral cystic change. Lateral: Advanced degenerative chondrosis with areas of full or near full-thickness cartilage loss, joint space narrowing, spurring and significant reactive marrow edema or osteitis. No definite stress fracture. Joint: There is a very large joint effusion with numerous fibrous bands and synovitis. I do not see any obvious erosions but an inflammatory arthropathy is possible. Joint aspiration may be helpful for further evaluation and for symptomatic relief. Popliteal Fossa: No popliteal mass or Baker's cyst. There is some fluid and edema tracking back along the popliteus tendon. Extensor Mechanism: The patella retinacular structures are intact and the quadriceps and patellar tendons are intact. Bones: No acute bony findings. No fracture or osteochondral lesion.  Other: Normal knee musculature. IMPRESSION: 1. Tricompartmental degenerative changes, most severe in the lateral compartment. 2. Large complex joint effusion and synovitis. No obvious erosions but could not exclude an inflammatory arthropathy. Joint aspiration may be helpful for diagnostic and therapeutic purposes. 3. Severely degenerated and torn lateral meniscus. 4. Intact ligamentous structures. Electronically Signed   By: Rudie Meyer M.D.   On: 10/30/2018 11:32   Dg Chest Portable 1 View  Result Date: 10/28/2018 CLINICAL DATA:  Right leg pain, chest pain and shortness of breath. History of hypertension. EXAM: PORTABLE CHEST 1 VIEW COMPARISON:  09/07/2018 FINDINGS: Moderate enlargement of the cardiopericardial silhouette, stable. No mediastinal or hilar masses. No convincing adenopathy. Mild prominence of the bronchovascular markings, stable. Lungs otherwise clear. No pleural effusion. No pneumothorax. Skeletal structures are grossly intact. IMPRESSION: 1. No acute cardiopulmonary disease. 2. Moderate cardiomegaly. Electronically Signed   By: Amie Portland M.D.   On: 10/28/2018 20:17   Dg Knee Complete 4 Views Right  Result Date: 10/07/2018 CLINICAL DATA:  Lateral knee pain EXAM: RIGHT KNEE - COMPLETE 4+ VIEW COMPARISON:  None. FINDINGS: No fracture or dislocation of the right knee. Mild tricompartmental joint space narrowing and osteophytosis. Very large nonspecific knee joint effusion. Diffuse soft tissue edema about the right knee. IMPRESSION: No fracture or dislocation of the right knee. Mild tricompartmental joint space narrowing and osteophytosis. Very large nonspecific knee joint effusion. Diffuse soft tissue edema about the right knee. Electronically Signed   By: Lauralyn Primes M.D.   On: 10/07/2018 10:45     LOS: 2 days   Letta Pate, PA-S  11/01/2018, 1:40 PM

## 2018-11-01 NOTE — Progress Notes (Addendum)
PROGRESS NOTE    Cody [name]  KPT:[id number] DOB: Jun 06, 1971 DOA: 10/28/2018 PCP: Storm Frisk, MD    Brief Narrative:  48 year old male who presented with dyspnea. He does have significant past medical history for atrial fibrillation, hypertension, chronic kidney disease stage III, chronic anemia, history of DVT. Reported 2 days of dyspnea, worse with exertion, andassociated with chest tightness.Apparently he ran out of his medications 2 days ago. Has not taken rivaroxaban for the last 3 weeks. On his initial physical examination his heart rate was 142 bpm, temperature 98.6,blood pressure 181/157, respiratory 27, oxygen saturation 100%.His lungs are clear to auscultation, heart S1-S2 present, irregularly irregular, abdomen soft, positive right knee edema. Sodium 138, potassium 3.1, chloride 102, bicarbonate 30, glucose 95, BUN 24, creatinine 1.81, troponin 0.14,white count 9.7, hemoglobin 11.5, hematocrit 37.0, platelets 380.His chest x-ray had cardiomegaly, vascular congestion and fluid in the right fissure.His EKG elation, 140 bpm, right axis, left bundle branch block, t waveinversions in the inferior lateral leads with poor R wave progression.  Patient was admitted to the hospital working diagnosis of atrial fibrillation with rapid ventricular response,complicated by decompensated heart failure.   Assessment & Plan:   Principal Problem:   Atrial fibrillation with RVR (HCC) Active Problems:   CKD (chronic kidney disease) stage 3, GFR 30-59 ml/min (HCC)   Obstructive sleep apnea   Hypertensive urgency   A-fib (HCC)   1. Atrial fibrillation with rapid ventricular response, complicated with acute on chronicsystolicheart failure decompensation/ LV systolic function 20- 25% with moderately increased left ventricular cavity.  His heart rate has been in the high 90 and low 100's. This am with persistent lower extremity edema. 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 Will give extra dose of IV furosemide today and continue with po furosemide. Continue hydralazine and lisinopril for after load reduction.   2. Uncontrolled HTN/ urgency.Hydralazine for blood pressure control.  3. AKI on CKD stage 3 with hypokalemia. Renal function with serum cr at 1,43, with K at 4,3 and serum bicarbonate at 26, will follow on renal panel in am. Output not documented today.   4. Right knee pain. Right knee MRI with significant degenerative changes, most severe in the lateral compartment. Large complex effusion and synovitis. His pain has improved with prednisone, will continue OT and PT. Will need outpatient follow up with orthopedics.   5. Reported history of DVT. On rivaroxaban, tolerating well.    6. Obesity. Calculated bmi is 36. Will need outpatient follow up.   DVT prophylaxis:rivaroxaban Code Status:full Family Communication:no family at the bedside Disposition Plan/ discharge barriers:possible dc in am.   Body mass index is 36.12 kg/m. Malnutrition Type:      Malnutrition Characteristics:      Nutrition Interventions:     RN Pressure Injury Documentation:     Consultants:   Cardiology   Procedures:     Antimicrobials:       Subjective: Patient is tolerating po well, his knee pain has improved, positive lower extremity edema, dyspnea has improved.   Objective: Vitals:   11/01/18 0000 11/01/18 0500 11/01/18 0616 11/01/18 0809  BP:  (!) 154/106 (!) 154/106 (!) 157/95  Pulse:      Resp: 12 17    Temp:  97.8 F (36.6 C)  (!) 97.5 F (36.4 C)  TempSrc:  Oral  Oral  SpO2:      Weight:      Height:  Intake/Output Summary (Last 24 hours) at 11/01/2018 1625 Last data filed at 10/31/2018 1700 Gross per 24 hour  Intake 240 ml  Output 0 ml  Net 240 ml   Filed Weights   10/28/18 2008 10/29/18 0557   Weight: 117.9 kg 120.8 kg    Examination:   General: deconditioned  Neurology: Awake and alert, non focal  E ENT: mild  pallor, no icterus, oral mucosa moist Cardiovascular: No JVD. S1-S2 present, rhythmic, no gallops, rubs, or murmurs. No lower extremity edema. Pulmonary: positive breath sounds bilaterally, mild decreased breath sounds at bases, no wheezing, rhonchi or rales. Gastrointestinal. Abdomen with no organomegaly, non tender, no rebound or guarding Skin. No rashes Musculoskeletal: no joint deformities/ right knee with improved range of motion and edema.      Data Reviewed: I have personally reviewed following labs and imaging studies  CBC: Recent Labs  Lab 10/28/18 1948 10/29/18 0312  WBC 9.7 9.3  HGB 11.5* 11.3*  HCT 37.0* 37.6*  MCV 79.6* 80.7  PLT 380 336   Basic Metabolic Panel: Recent Labs  Lab 10/28/18 1948 10/29/18 0312 10/30/18 0237 10/31/18 0215 11/01/18 0225  NA 138 138 137 138 137  K 3.1* 3.0* 3.5 3.7 4.3  CL 102 102 101 100 102  CO2 23 24 26 28 26   GLUCOSE 95 166* 93 86 135*  BUN 24* 23* 24* 28* 28*  CREATININE 1.81* 1.83* 1.69* 1.73* 1.43*  CALCIUM 9.3 9.1 8.9 9.3 9.6  MG  --  1.7  --   --   --    GFR: Estimated Creatinine Clearance: 85.7 mL/min (A) (by C-G formula based on SCr of 1.43 mg/dL (H)). Liver Function Tests: Recent Labs  Lab 10/29/18 0312  AST 32  ALT 24  ALKPHOS 74  BILITOT 2.4*  PROT 7.0  ALBUMIN 3.6   No results for input(s): LIPASE, AMYLASE in the last 168 hours. No results for input(s): AMMONIA in the last 168 hours. Coagulation Profile: No results for input(s): INR, PROTIME in the last 168 hours. Cardiac Enzymes: Recent Labs  Lab 10/29/18 0312 10/29/18 0918 10/29/18 1503  TROPONINI 0.09* 0.07* 0.06*   BNP (last 3 results) No results for input(s): PROBNP in the last 8760 hours. HbA1C: No results for input(s): HGBA1C in the last 72 hours. CBG: No results for input(s): GLUCAP in the last 168 hours.  Lipid Profile: No results for input(s): CHOL, HDL, LDLCALC, TRIG, CHOLHDL, LDLDIRECT in the last 72 hours. Thyroid Function Tests: No results for input(s): TSH, T4TOTAL, FREET4, T3FREE, THYROIDAB in the last 72 hours. Anemia Panel: No results for input(s): VITAMINB12, FOLATE, FERRITIN, TIBC, IRON, RETICCTPCT in the last 72 hours.    Radiology Studies: I have reviewed all of the imaging during this hospital visit personally     Scheduled Meds: . carvedilol  25 mg Oral BID WC  . diclofenac sodium  2 g Topical QID  . docusate sodium  100 mg Oral BID  . furosemide  80 mg Intravenous Once  . furosemide  80 mg Oral Daily  . hydrALAZINE  50 mg Oral Q8H  . lisinopril  40 mg Oral Daily  . potassium chloride SA  20 mEq Oral BID  . predniSONE  20 mg Oral Q breakfast  . rivaroxaban  20 mg Oral Q supper  . sodium chloride flush  3 mL Intravenous Once   Continuous Infusions:   LOS: 2 days        Diva Lemberger Annett Gula, MD

## 2018-11-02 ENCOUNTER — Inpatient Hospital Stay (HOSPITAL_COMMUNITY): Payer: Medicaid Other

## 2018-11-02 DIAGNOSIS — I82409 Acute embolism and thrombosis of unspecified deep veins of unspecified lower extremity: Secondary | ICD-10-CM

## 2018-11-02 LAB — BASIC METABOLIC PANEL
Anion gap: 8 (ref 5–15)
BUN: 30 mg/dL — ABNORMAL HIGH (ref 6–20)
CO2: 28 mmol/L (ref 22–32)
Calcium: 9.6 mg/dL (ref 8.9–10.3)
Chloride: 101 mmol/L (ref 98–111)
Creatinine, Ser: 1.54 mg/dL — ABNORMAL HIGH (ref 0.61–1.24)
GFR calc Af Amer: >60 mL/min (ref >60)
GFR calc non Af Amer: 53 mL/min — ABNORMAL LOW (ref >60)
Glucose, Bld: 117 mg/dL — ABNORMAL HIGH (ref 70–99)
Potassium: 4 mmol/L (ref 3.5–5.1)
Sodium: 137 mmol/L (ref 135–145)

## 2018-11-02 MED ORDER — POTASSIUM CHLORIDE CRYS ER 20 MEQ PO TBCR: 20.0000 meq | EXTENDED_RELEASE_TABLET | Freq: Two times a day (BID) | ORAL | 0 refills | Status: DC

## 2018-11-02 MED ORDER — LISINOPRIL 40 MG PO TABS: 40.0000 mg | ORAL_TABLET | Freq: Every day | ORAL | 0 refills | Status: DC

## 2018-11-02 MED ORDER — PREDNISONE 10 MG PO TABS: 10.0000 mg | ORAL_TABLET | Freq: Every day | ORAL | 0 refills | Status: AC

## 2018-11-02 MED ORDER — AMIODARONE HCL 200 MG PO TABS: 400.0000 mg | ORAL_TABLET | Freq: Three times a day (TID) | ORAL | Status: DC

## 2018-11-02 MED ORDER — RIVAROXABAN 15 MG PO TABS: 15.0000 mg | ORAL_TABLET | Freq: Every day | ORAL | 0 refills | Status: DC

## 2018-11-02 MED ORDER — AMIODARONE HCL 400 MG PO TABS: 400.0000 mg | ORAL_TABLET | Freq: Three times a day (TID) | ORAL | 0 refills | Status: DC

## 2018-11-02 MED ORDER — METOLAZONE 2.5 MG PO TABS: 2.5000 mg | ORAL_TABLET | Freq: Every day | ORAL | 6 refills | Status: DC | PRN

## 2018-11-02 MED ORDER — HYDRALAZINE HCL 50 MG PO TABS: 50.0000 mg | ORAL_TABLET | Freq: Three times a day (TID) | ORAL | 0 refills | Status: DC

## 2018-11-02 MED ORDER — CARVEDILOL 25 MG PO TABS: 25.0000 mg | ORAL_TABLET | Freq: Two times a day (BID) | ORAL | 0 refills | Status: DC

## 2018-11-02 MED ORDER — FUROSEMIDE 40 MG PO TABS: 80.0000 mg | ORAL_TABLET | Freq: Two times a day (BID) | ORAL | 0 refills | Status: DC

## 2018-11-02 MED FILL — AMIODARONE HCL 200 MG TAB: 200 | 20 days supply | Qty: 120 | Fill #0

## 2018-11-02 MED FILL — predniSONE 10 MG TABS: 10 | 3 days supply | Qty: 3 | Fill #0

## 2018-11-02 NOTE — Progress Notes (Signed)
Explained patient to give discharge instruction, pt understood it. Vascular lab came to do doppler on Rt. Leg due to Rt. Knee swelling. Came back to his room, he was gone. Found patient at the ER exit area. Explained patient need to get discharge instructions then he can go home. Patient agreed with this nurse. Pt took out PIV access. Case manager Sam talked patient regarding his medications and he understood it. Patient took his all belongings. HS Sabra Heck

## 2018-11-02 NOTE — Progress Notes (Signed)
Lower extremity has been completed.   Preliminary results in CV Proc.   Blanch Media 11/02/2018 9:27 AM

## 2018-11-02 NOTE — Progress Notes (Signed)
Pt wanted to go home at 8:30 am when shift changes. Explained patient regarding discharge process, but patient said that he has been hospitalized 5 days, he needs to return the work. Explained pt's HR is still high, but he talked cardiologist to check outpatient this Thursday. Patient insisted to go home at 8:30 am. Paging Dr. Ella Jubilee for this matter. HS [employer]

## 2018-11-02 NOTE — TOC Transition Note (Addendum)
Transition of Care [facility]) - CM/SW Discharge Note   Patient Details  Name: Cody [name] MRN: [id number] Date of Birth: 11/15/1970  [facility] CM/SW Contact:  Cherylann Parr, RN Phone Number: 11/02/2018, 9:23 AM   Clinical Narrative:   Pt to discharge home today, pts son will take him home via private vehicle.  Pt informed CM that he can borrow money from family to pay for maintenance meds refills today - pt gets paid on Friday and plans to reimburse him at that time.    CM contacted [facility] pharmacy. Pharmacy informed CM that pt has already received free medications in the past, and also that pt has been encouraged to complete the blue card application - however pt has not yet completed.   Pharmacy thas agreed to allow a non payment for  the two new discharge meds today only  (pt will be billed) - pt will need to complete blue card application.  CM reiterated to pt the urgency of applying for blue card.  Pt declined for CM to make post discharge follow up appt - pt informed CM that he will make it     Barriers to Discharge: Barriers Resolved   Patient Goals and CMS Choice Patient states their goals for this hospitalization and ongoing recovery are:: To feel better      Discharge Placement Home                        Discharge Plan and Services: See above                     Social Determinants of Health (SDOH) Interventions     Readmission Risk Interventions No flowsheet data found.

## 2018-11-02 NOTE — Discharge Summary (Signed)
Physician Discharge Summary  ADONUS USELMAN ZOX:[id number] DOB: 15-Jan-1971 DOA: 10/28/2018  PCP: Storm Frisk, MD  Admit date: 10/28/2018 Discharge date: 11/02/2018  Admitted From: Home  Disposition:  Home   Recommendations for Outpatient Follow-up and new medication changes:  1. Follow up with Dr. Rosemary Holms on Thursday, Cardiology.  2. Patient is requesting to be discharged, he has a important orientation for a new job that he can not miss. I explained him that his heart rate is still not controlled and he has high risk for decompensation. He understands the risk and still wants to be discharge in order to assist to his job orientation. I will give him his prescriptions and have social worked help with his medications.  3. Patient has been resumed on carvedilol, heart rate is still not controlled, with peaks up to 130, will add amiodarone per cardiology recommendations.   4. Patient will resume diuresis with furosemide and will change metolazone to use as needed.  5. Follow up with Dr. Delford Field in 7 days.   Home Health:no   Equipment/Devices: no    Discharge Condition: stable  CODE STATUS: full  Diet recommendation: heart healthy and heart failure prudent.   Brief/Interim Summary: 48 year old male who presented with dyspnea. He does have significant past medical history for atrial fibrillation, hypertension, chronic kidney disease stage III, chronic anemia, and history of DVT. Reported 2 days of dyspnea, worse with exertion, andassociated with chest tightness.Apparently he ran out of his medications 2 days ago. Has not taken rivaroxaban for the last 3 weeks. On his initial physical examination his heart rate was 142 bpm, temperature 98.6,blood pressure 181/157, respiratory rate 27, oxygen saturation 100%.His lungs were clear to auscultation, heart S1-S2 present, irregularly irregular, abdomen soft, positive right knee edema. Sodium 138, potassium 3.1, chloride 102, bicarbonate 30,  glucose 95, BUN 24, creatinine 1.81, troponin 0.14,white count 9.7, hemoglobin 11.5, hematocrit 37.0, platelets 380.His chest x-ray had cardiomegaly, vascular congestion and fluid in the right fissure.His EKG had atrial fibrillation rhythm at 140 bpm, right axis, left bundle branch block, t waveinversions in the inferior lateral leads with poor R wave progression.  Patient was admitted to the hospital working diagnosis of atrial fibrillation with rapid ventricular response,complicated by decompensated heart failure.  1.  Atrial fibrillation with rapid ventricular response, complicated with acute on chronic systolic heart failure/ nonischemic cardiomyopathy, LV systolic function 20 to 25% with moderately increased left ventricular cavity.  Patient was admitted to the stepdown unit, he initially was placed on a diltiazem drip with improvement of his heart rate, he was transitioned to beta-blockade with metoprolol and then change to carvedilol.  His heart rate still in the 100s with peaks of 130s, anticoagulation with rivaroxaban.  Due to uncontrolled heart rate amiodarone has been started 400 mg three time daily, follow-up with the cardiology clinic in 48 hours.  Patient has requested to be discharged, even though his heart rate is not still fully controlled, he understands the risk of leaving the hospital without appropriate rate control, risk of rapid decompensation and death.  He understands the risk and is still willing to be discharged. He will follow-up with cardiology in 48 hours.  Will assist him in getting his medications.  Patient was diuresed with IV furosemide, negative fluid balance was achieved, (he lost about 5 kg, discharge weight 115 kg from admission 120 kg), with significant improvement of his symptoms.  Patient will continue taking furosemide 80 mg twice daily and as needed metolazone.  Continue potassium  supplements.  Patient will need further cardiac work-up, he will follow-up  with Dr. Rosemary Holms in the outpatient cardiology clinic.   2.  Acute kidney injury on chronic kidney disease stage III with hypokalemia.  Patient responded well to diuresis with furosemide, his discharge creatinine is 1.54, potassium 4.0 and serum bicarbonate of 28.  Patient will continue taking furosemide 80 mg twice daily along with as needed metolazone.  Will need close follow-up of kidney function electrolytes.  Continue potassium supplements.  3.  Uncontrolled hypertension/urgency.  She was resumed on hydralazine and lisinopril with improvement of his blood pressure.  4.  Right knee osteoarthritis.  Patient had significant right knee pain, further work-up with knee MRI shows significant  tricompartmental degenerative changes most severe in the lateral compartment, large complex effusion and synovitis.  Severely degenerated and torn lateral meniscus.  Patient was seen by physical therapy and occupational therapy, a trial of prednisone was started with improvement of his symptoms.  Orthopedics was contacted and recommended continued supportive medical therapy.  5.  Reported history of DVT.  Continue anticoagulation with rivaroxaban.  6.  Obesity.  Calculated BMI 36, outpatient follow-up.  Discharge Diagnoses:  Principal Problem:   Atrial fibrillation with RVR (HCC) Active Problems:   CKD (chronic kidney disease) stage 3, GFR 30-59 ml/min (HCC)   Obstructive sleep apnea   Hypertensive urgency   A-fib [facility])    Discharge Instructions   Allergies as of 11/02/2018   No Known Allergies     Medication List    STOP taking these medications   acetaminophen 650 MG CR tablet Commonly known as:  TYLENOL   docusate sodium 50 MG capsule Commonly known as:  COLACE   HYDROcodone-acetaminophen 5-325 MG tablet Commonly known as:  NORCO/VICODIN     TAKE these medications   amiodarone 400 MG tablet Commonly known as:  PACERONE Take 1 tablet (400 mg total) by mouth 3 (three) times daily  for 20 days.   carvedilol 25 MG tablet Commonly known as:  COREG Take 1 tablet (25 mg total) by mouth 2 (two) times daily with a meal.   furosemide 40 MG tablet Commonly known as:  LASIX Take 2 tablets (80 mg total) by mouth 2 (two) times daily for 30 days.   hydrALAZINE 50 MG tablet Commonly known as:  APRESOLINE Take 1 tablet (50 mg total) by mouth 3 (three) times daily for 30 days.   lisinopril 40 MG tablet Commonly known as:  PRINIVIL,ZESTRIL Take 1 tablet (40 mg total) by mouth daily for 30 days.   metolazone 2.5 MG tablet Commonly known as:  ZAROXOLYN Take 1 tablet (2.5 mg total) by mouth daily as needed (in case of weight gain 3 lbs in 24 hours, or 5 lbs in 7 days.). What changed:    when to take this  reasons to take this   potassium chloride SA 20 MEQ tablet Commonly known as:  K-DUR,KLOR-CON Take 1 tablet (20 mEq total) by mouth 2 (two) times daily for 30 days.   predniSONE 10 MG tablet Commonly known as:  DELTASONE Take 1 tablet (10 mg total) by mouth daily with breakfast for 3 days. Start taking on:  November 03, 2018   Rivaroxaban 15 MG Tabs tablet Commonly known as:  XARELTO Take 1 tablet (15 mg total) by mouth daily with supper for 30 days.      Follow-up Information    Storm Frisk, MD.   Specialty:  Pulmonary Disease Contact information: [address] [employer]  [place] [address] [phone]          No Known Allergies  Consultations:  Cardiology    Procedures/Studies: Ct Knee Right Wo Contrast  Result Date: 10/29/2018 CLINICAL DATA:  Right knee pain and swelling for weeks without known injury. EXAM: CT OF THE RIGHT KNEE WITHOUT CONTRAST TECHNIQUE: Multidetector CT imaging of the RIGHT knee was performed according to the standard protocol. Multiplanar CT image reconstructions were also generated. COMPARISON:  Knee radiographs 10/07/2018 FINDINGS: Bones/Joint/Cartilage Subchondral degenerative cystic change is noted of the tibial  plateau more so laterally with degenerative spurring of the tibial spine, tibial plateau and both femoral condyles. The presence of subchondral degenerative change raise suspicion for full thickness chondral loss and or fissuring especially of the lateral tibial plateau cartilage. No acute fracture or suspicious osseous lesions. Moderate to large suprapatellar joint effusion is partially included. Prepatellar bursal fluid is also seen. Soft tissue induration of Hoffa's fat pad is noted, nonspecific. The constellation of these findings may represent a marked synovitis. Ligaments Suboptimally assessed by CT. Slight undulating appearance of the ACL may represent a sprain or tear, series 9/43. MRI may help for further correlation if possible. The course and morphology of the PCL appears largely intact. The collateral ligaments are suboptimally assessed on CT of the expected course of the collateral ligaments appear largely intact. Muscles and Tendons No atrophy.  No popliteal cyst.  Intact popliteus. Soft tissues Prepatellar bursal fluid with soft tissue edema anteriorly. IMPRESSION: 1. Tricompartmental osteoarthritis greatest along the lateral femorotibial compartment with there are scattered areas of subchondral degenerative cystic change and/or erosions of the lateral tibial plateau. Findings may represent a secondary sign of full thickness chondral fissuring or loss allowing for the erosive/degenerative change. 2. Moderate to large suprapatellar joint effusion is partially included without fat fluid level to suggest an acute fracture. Findings may represent a synovitis given induration of Hoffa's fat pad. 3. Prepatellar bursal fluid consistent with bursitis or potentially posttraumatic change/remote hematoma. 4. No acute osseous abnormality. Electronically Signed   By: Tollie Eth M.D.   On: 10/29/2018 03:25   Mr Knee Right Wo Contrast  Result Date: 10/30/2018 CLINICAL DATA:  Knee pain and swelling. EXAM: MRI OF  THE RIGHT KNEE WITHOUT CONTRAST TECHNIQUE: Multiplanar, multisequence MR imaging of the knee was performed. No intravenous contrast was administered. COMPARISON:  CT scan 10/29/2018 FINDINGS: MENISCI Medial meniscus:  Degenerative changes but no obvious tear. Lateral meniscus:  Severely degenerated and torn. LIGAMENTS Cruciates:  Intact Collaterals:  Intact CARTILAGE Patellofemoral:  Moderate degenerative chondrosis. Medial: Moderate degenerative chondrosis. Early spurring and subchondral cystic change. Lateral: Advanced degenerative chondrosis with areas of full or near full-thickness cartilage loss, joint space narrowing, spurring and significant reactive marrow edema or osteitis. No definite stress fracture. Joint: There is a very large joint effusion with numerous fibrous bands and synovitis. I do not see any obvious erosions but an inflammatory arthropathy is possible. Joint aspiration may be helpful for further evaluation and for symptomatic relief. Popliteal Fossa: No popliteal mass or Baker's cyst. There is some fluid and edema tracking back along the popliteus tendon. Extensor Mechanism: The patella retinacular structures are intact and the quadriceps and patellar tendons are intact. Bones: No acute bony findings. No fracture or osteochondral lesion. Other: Normal knee musculature. IMPRESSION: 1. Tricompartmental degenerative changes, most severe in the lateral compartment. 2. Large complex joint effusion and synovitis. No obvious erosions but could not exclude an inflammatory arthropathy. Joint aspiration may be helpful for diagnostic and  therapeutic purposes. 3. Severely degenerated and torn lateral meniscus. 4. Intact ligamentous structures. Electronically Signed   By: Rudie Meyer M.D.   On: 10/30/2018 11:32   Dg Chest Portable 1 View  Result Date: 10/28/2018 CLINICAL DATA:  Right leg pain, chest pain and shortness of breath. History of hypertension. EXAM: PORTABLE CHEST 1 VIEW COMPARISON:   09/07/2018 FINDINGS: Moderate enlargement of the cardiopericardial silhouette, stable. No mediastinal or hilar masses. No convincing adenopathy. Mild prominence of the bronchovascular markings, stable. Lungs otherwise clear. No pleural effusion. No pneumothorax. Skeletal structures are grossly intact. IMPRESSION: 1. No acute cardiopulmonary disease. 2. Moderate cardiomegaly. Electronically Signed   By: Amie Portland M.D.   On: 10/28/2018 20:17   Dg Knee Complete 4 Views Right  Result Date: 10/07/2018 CLINICAL DATA:  Lateral knee pain EXAM: RIGHT KNEE - COMPLETE 4+ VIEW COMPARISON:  None. FINDINGS: No fracture or dislocation of the right knee. Mild tricompartmental joint space narrowing and osteophytosis. Very large nonspecific knee joint effusion. Diffuse soft tissue edema about the right knee. IMPRESSION: No fracture or dislocation of the right knee. Mild tricompartmental joint space narrowing and osteophytosis. Very large nonspecific knee joint effusion. Diffuse soft tissue edema about the right knee. Electronically Signed   By: Lauralyn Primes M.D.   On: 10/07/2018 10:45       Subjective: Patient is feeling better, no further right knee pain, no nausea or vomiting, no chest pain, dyspnea or palpitations.   Discharge Exam: Vitals:   11/02/18 0631 11/02/18 0722  BP: (!) 169/109 (!) 142/89  Pulse: 90   Resp:    Temp:  97.8 F (36.6 C)  SpO2:     Vitals:   11/01/18 2343 11/02/18 0420 11/02/18 0631 11/02/18 0722  BP: (!) 150/107 (!) 146/96 (!) 169/109 (!) 142/89  Pulse:   90   Resp:      Temp: 98.4 F (36.9 C) 98.7 F (37.1 C)  97.8 F (36.6 C)  TempSrc: Oral Oral  Oral  SpO2:      Weight:  115.8 kg    Height:        General: Not in pain or dyspnea  Neurology: Awake and alert, non focal  E ENT: no pallor, no icterus, oral mucosa moist Cardiovascular: No JVD. S1-S2 present, irregularly irregular with no gallops, rubs, or murmurs. Trace lower extremity edema. Pulmonary: positive  breath sounds bilaterally, adequate air movement, no wheezing, rhonchi or rales. Gastrointestinal. Abdomen with, no organomegaly, non tender, no rebound or guarding Skin. No rashes Musculoskeletal: no joint deformities   The results of significant diagnostics from this hospitalization (including imaging, microbiology, ancillary and laboratory) are listed below for reference.     Microbiology: Recent Results (from the past 240 hour(s))  MRSA PCR Screening     Status: None   Collection Time: 10/29/18  5:32 AM  Result Value Ref Range Status   MRSA by PCR NEGATIVE NEGATIVE Final    Comment:        The GeneXpert MRSA Assay (FDA approved for NASAL specimens only), is one component of a comprehensive MRSA colonization surveillance program. It is not intended to diagnose MRSA infection nor to guide or monitor treatment for MRSA infections. Performed at [facility], [address] [place], [place] [address]      Labs: BNP (last 3 results) Recent Labs    07/17/18 1644  BNP 814.4*   Basic Metabolic Panel: Recent Labs  Lab 10/29/18 0312 10/30/18 0237 10/31/18 0215 11/01/18 0225 11/02/18 0214  NA 138 137 138 137 137  K 3.0* 3.5 3.7 4.3 4.0  CL 102 101 100 102 101  CO2 24 26 28 26 28   GLUCOSE 166* 93 86 135* 117*  BUN 23* 24* 28* 28* 30*  CREATININE 1.83* 1.69* 1.73* 1.43* 1.54*  CALCIUM 9.1 8.9 9.3 9.6 9.6  MG 1.7  --   --   --   --    Liver Function Tests: Recent Labs  Lab 10/29/18 0312  AST 32  ALT 24  ALKPHOS 74  BILITOT 2.4*  PROT 7.0  ALBUMIN 3.6   No results for input(s): LIPASE, AMYLASE in the last 168 hours. No results for input(s): AMMONIA in the last 168 hours. CBC: Recent Labs  Lab 10/28/18 1948 10/29/18 0312  WBC 9.7 9.3  HGB 11.5* 11.3*  HCT 37.0* 37.6*  MCV 79.6* 80.7  PLT 380 336   Cardiac Enzymes: Recent Labs  Lab 10/29/18 0312 10/29/18 0918 10/29/18 1503  TROPONINI 0.09* 0.07* 0.06*   BNP: Invalid input(s):  POCBNP CBG: No results for input(s): GLUCAP in the last 168 hours. D-Dimer No results for input(s): DDIMER in the last 72 hours. Hgb A1c No results for input(s): HGBA1C in the last 72 hours. Lipid Profile No results for input(s): CHOL, HDL, LDLCALC, TRIG, CHOLHDL, LDLDIRECT in the last 72 hours. Thyroid function studies No results for input(s): TSH, T4TOTAL, T3FREE, THYROIDAB in the last 72 hours.  Invalid input(s): FREET3 Anemia work up No results for input(s): VITAMINB12, FOLATE, FERRITIN, TIBC, IRON, RETICCTPCT in the last 72 hours. Urinalysis    Component Value Date/Time   LABSPEC >=1.030 01/27/2017 1343   PHURINE 6.5 01/27/2017 1343   GLUCOSEU NEGATIVE 01/27/2017 1343   HGBUR TRACE (A) 01/27/2017 1343   BILIRUBINUR negative 07/27/2018 1530   KETONESUR negative 07/27/2018 1530   KETONESUR NEGATIVE 01/27/2017 1343   PROTEINUR >=300 (A) 01/27/2017 1343   UROBILINOGEN 1.0 07/27/2018 1530   UROBILINOGEN 2.0 (H) 01/27/2017 1343   NITRITE Negative 07/27/2018 1530   NITRITE NEGATIVE 01/27/2017 1343   LEUKOCYTESUR Negative 07/27/2018 1530   Sepsis Labs Invalid input(s): PROCALCITONIN,  WBC,  LACTICIDVEN Microbiology Recent Results (from the past 240 hour(s))  MRSA PCR Screening     Status: None   Collection Time: 10/29/18  5:32 AM  Result Value Ref Range Status   MRSA by PCR NEGATIVE NEGATIVE Final    Comment:        The GeneXpert MRSA Assay (FDA approved for NASAL specimens only), is one component of a comprehensive MRSA colonization surveillance program. It is not intended to diagnose MRSA infection nor to guide or monitor treatment for MRSA infections. Performed at [facility], [address] [place], [place] [address]      Time coordinating discharge: 45 minutes  SIGNED:   Coralie Keens, MD  [facility] 11/02/2018, 8:32 AM

## 2018-11-03 ENCOUNTER — Telehealth: Payer: Self-pay

## 2018-11-03 MED FILL — hydrALAZINE HCL 50 MG TABS: 50 | 30 days supply | Qty: 90 | Fill #0

## 2018-11-03 MED FILL — FUROSEMIDE 40 MG TAB: 40 | 30 days supply | Qty: 120 | Fill #0

## 2018-11-03 MED FILL — CARVEDILOL 25 MG TABLET: 25 | 30 days supply | Qty: 60 | Fill #0

## 2018-11-03 MED FILL — LISINOPRIL 40 MG TABLET: 40 | 30 days supply | Qty: 30 | Fill #0

## 2018-11-03 MED FILL — XARELTO 15 MG TABLET: 15 | 30 days supply | Qty: 30 | Fill #0

## 2018-11-03 MED FILL — POTASSIUM CL ER 20 MEQ TAB: 20 | 30 days supply | Qty: 60 | Fill #0

## 2018-11-03 NOTE — Telephone Encounter (Signed)
-----   Message from [facility], MD sent at 11/01/2018  4:42 PM EDT ----- Regarding: TOC I would like to see me on Thursday this week for TOC.  Discharge follow up: TOC: Needed Follow up appt: Will be scheduled Discharge diagnosis: Afib RVR, HFrEF Discharge date: 11/02/2018  Thanks MJP

## 2018-11-03 NOTE — Telephone Encounter (Signed)
Unable to leave vm not set up yet, contacted mothers number and not able to leave vm to start TOC.

## 2018-11-04 ENCOUNTER — Encounter (HOSPITAL_COMMUNITY): Payer: Self-pay

## 2018-11-04 ENCOUNTER — Emergency Department (HOSPITAL_COMMUNITY)
Admission: EM | Admit: 2018-11-04 | Discharge: 2018-11-04 | Disposition: A | Discharge status: Home / Self Care | Payer: Medicaid Other | Attending: Emergency Medicine | Admitting: Emergency Medicine

## 2018-11-04 ENCOUNTER — Other Ambulatory Visit: Payer: Self-pay

## 2018-11-04 ENCOUNTER — Emergency Department (HOSPITAL_COMMUNITY): Payer: Medicaid Other

## 2018-11-04 DIAGNOSIS — R002 Palpitations: Secondary | ICD-10-CM | POA: Diagnosis not present

## 2018-11-04 DIAGNOSIS — I130 Hypertensive heart and chronic kidney disease with heart failure and stage 1 through stage 4 chronic kidney disease, or unspecified chronic kidney disease: Secondary | ICD-10-CM | POA: Diagnosis not present

## 2018-11-04 DIAGNOSIS — N183 Chronic kidney disease, stage 3 (moderate): Secondary | ICD-10-CM | POA: Insufficient documentation

## 2018-11-04 DIAGNOSIS — I5022 Chronic systolic (congestive) heart failure: Secondary | ICD-10-CM | POA: Insufficient documentation

## 2018-11-04 DIAGNOSIS — R0602 Shortness of breath: Secondary | ICD-10-CM | POA: Diagnosis present

## 2018-11-04 LAB — HEPATIC FUNCTION PANEL
ALBUMIN: 3.5 g/dL (ref 3.5–5.0)
ALT: 20 U/L (ref 0–44)
AST: 21 U/L (ref 15–41)
Alkaline Phosphatase: 85 U/L (ref 38–126)
Bilirubin, Direct: 0.3 mg/dL — ABNORMAL HIGH (ref 0.0–0.2)
Indirect Bilirubin: 0.8 mg/dL (ref 0.3–0.9)
Total Bilirubin: 1.1 mg/dL (ref 0.3–1.2)
Total Protein: 6.7 g/dL (ref 6.5–8.1)

## 2018-11-04 LAB — BASIC METABOLIC PANEL
Anion gap: 9 (ref 5–15)
BUN: 25 mg/dL — ABNORMAL HIGH (ref 6–20)
CHLORIDE: 104 mmol/L (ref 98–111)
CO2: 27 mmol/L (ref 22–32)
Calcium: 9 mg/dL (ref 8.9–10.3)
Creatinine, Ser: 1.37 mg/dL — ABNORMAL HIGH (ref 0.61–1.24)
GFR calc Af Amer: >60 mL/min (ref >60)
GFR calc non Af Amer: >60 mL/min (ref >60)
Glucose, Bld: 109 mg/dL — ABNORMAL HIGH (ref 70–99)
POTASSIUM: 3.5 mmol/L (ref 3.5–5.1)
Sodium: 140 mmol/L (ref 135–145)

## 2018-11-04 LAB — CBC
HCT: 38.5 % — ABNORMAL LOW (ref 39.0–52.0)
HEMOGLOBIN: 11.4 g/dL — AB (ref 13.0–17.0)
MCH: 24.7 pg — ABNORMAL LOW (ref 26.0–34.0)
MCHC: 29.6 g/dL — ABNORMAL LOW (ref 30.0–36.0)
MCV: 83.3 fL (ref 80.0–100.0)
Platelets: 386 10*3/uL (ref 150–400)
RBC: 4.62 MIL/uL (ref 4.22–5.81)
RDW: 18.3 % — ABNORMAL HIGH (ref 11.5–15.5)
WBC: 10.7 10*3/uL — ABNORMAL HIGH (ref 4.0–10.5)
nRBC: 0 % (ref 0.0–0.2)

## 2018-11-04 LAB — TROPONIN I: Troponin I: 0.08 ng/mL (ref <0.03)

## 2018-11-04 MED ORDER — RIVAROXABAN 15 MG PO TABS: 15.0000 mg | ORAL_TABLET | Freq: Once | ORAL | Status: DC

## 2018-11-04 MED ORDER — DILTIAZEM HCL 25 MG/5ML IV SOLN
10.0000 mg | Freq: Once | INTRAVENOUS | Status: AC
  Administered 2018-11-04: 10 mg via INTRAVENOUS

## 2018-11-04 MED ORDER — ACETAMINOPHEN 325 MG PO TABS
650.0000 mg | ORAL_TABLET | Freq: Once | ORAL | Status: AC
  Administered 2018-11-04: 650 mg via ORAL

## 2018-11-04 MED ORDER — HYDRALAZINE HCL 25 MG PO TABS
50.0000 mg | ORAL_TABLET | Freq: Once | ORAL | Status: AC
  Administered 2018-11-04: 50 mg via ORAL
  Filled 2018-11-04: qty 2

## 2018-11-04 MED ORDER — ACETAMINOPHEN 325 MG PO TABS
ORAL_TABLET | ORAL | Status: AC
  Filled 2018-11-04: qty 2

## 2018-11-04 MED ORDER — AMIODARONE HCL 200 MG PO TABS: 400.0000 mg | ORAL_TABLET | Freq: Every day | ORAL | Status: DC

## 2018-11-04 MED ORDER — AMIODARONE HCL 200 MG PO TABS
400.0000 mg | ORAL_TABLET | Freq: Once | ORAL | Status: AC
  Administered 2018-11-04: 400 mg via ORAL
  Filled 2018-11-04: qty 2

## 2018-11-04 MED ORDER — DILTIAZEM HCL 100 MG IV SOLR
5.0000 mg/h | INTRAVENOUS | Status: DC
  Administered 2018-11-04: 5 mg/h via INTRAVENOUS
  Filled 2018-11-04: qty 100

## 2018-11-04 MED ORDER — CARVEDILOL 12.5 MG PO TABS
25.0000 mg | ORAL_TABLET | Freq: Two times a day (BID) | ORAL | Status: DC
  Administered 2018-11-04: 25 mg via ORAL
  Filled 2018-11-04: qty 2

## 2018-11-04 MED ORDER — RIVAROXABAN 20 MG PO TABS
20.0000 mg | ORAL_TABLET | Freq: Once | ORAL | Status: AC
  Administered 2018-11-04: 20 mg via ORAL
  Filled 2018-11-04: qty 1

## 2018-11-04 NOTE — Telephone Encounter (Signed)
Unable to contact pt to finish TOC  No vm setup.

## 2018-11-04 NOTE — Clinical Social Work Note (Addendum)
Met with patient as chart indicates no insurance and no PCP.  However, chart also indicates he has appointment at [facility] in [place] on 3/24.  Pt confirms this appointment, and also states that he has plans to go to the clinic today as he gets paid today, and will pick up meds they have waiting for him.  States he is here as his son lives in [place], and he was visiting when he began to feel ill enough that he needed to come to ED because he had not gotten his medications filled after he was d/ced from hospital a couple of days ago.  Son will deliver him to [facility] from here when discharged.  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 Does not qualify for food stamps. Voiced understanding that he knows he needs to complete application for orange card in order to continue to get help with his medications through the [facility].

## 2018-11-04 NOTE — ED Notes (Signed)
CRITICAL VALUE ALERT  Critical Value:  Trop 0.08  Date & Time Notied:  11/04/2018, 1914  Provider Notified: Dr. Estell Harpin  Orders Received/Actions taken: no new orders at this time

## 2018-11-04 NOTE — ED Triage Notes (Signed)
Pt reports sob since yesterday.  Reports didn't take his meds yesterday because he was out.  PT also c/o r knee pain.  Denies injury, reports arthritis.  bp 190/130.  Denies cp.  Denies cough and fever.  02sat 100% on room air.

## 2018-11-04 NOTE — Discharge Instructions (Addendum)
Go directly to your 2:00 appointment today and get your medicines today and start taking them.  Follow-up with the heart doctor as planned

## 2018-11-04 NOTE — ED Provider Notes (Signed)
[facility] EMERGENCY DEPARTMENT Provider Note   CSN: [id number] Arrival date & time: 11/04/18  7253    History   Chief Complaint Chief Complaint  Patient presents with  . Shortness of Breath    HPI Cody [name] is a 48 y.o. male.     Patient complains of palpitations.  He was discharged from the hospital 2 days ago for atrial fibrillation rapid rate.  He has not taken his medicine since then.  The history is provided by the patient. No language interpreter was used.  Shortness of Breath  Severity:  Moderate Onset quality:  Sudden Timing:  Constant Progression:  Worsening Chronicity:  New Context: activity   Relieved by:  Nothing Worsened by:  Nothing Ineffective treatments:  None tried Associated symptoms: no abdominal pain, no chest pain, no cough, no headaches and no rash     Past Medical History:  Diagnosis Date  . Atrial fibrillation (HCC)   . CKD (chronic kidney disease), stage III (HCC)    [name] 02/20/2016  . DVT (deep venous thrombosis) (HCC) 01/2016   a. RLE - Dx 01/2016 in [name] - Took Xarelto for 30 days - out x 2 wks.  [name]   . Hypertension   . Hypertensive heart disease with CHF (congestive heart failure) [facility])     Patient Active Problem List   Diagnosis Date Noted  . Atrial fibrillation with RVR (HCC) 10/29/2018  . Hypertensive urgency 10/29/2018  . A-fib (HCC) 10/29/2018  . Chronic atrial fibrillation 08/02/2018  . History of DVT (deep vein thrombosis) 07/27/2018  . Anemia due to stage 3 chronic kidney disease (HCC) 07/27/2018  . Heme positive stool 07/27/2018  . Uncontrolled hypertension 07/19/2018  . Systolic CHF, acute on chronic (HCC) 07/17/2018  . Obstructive sleep apnea 12/11/2016  . H/O noncompliance with medical treatment, presenting hazards to health   . CKD (chronic kidney disease) stage 3, GFR 30-59 ml/min (HCC) 02/20/2016    Past Surgical History:  Procedure Laterality Date  . NO PAST SURGERIES          Home  Medications    Prior to Admission medications   Medication Sig Start Date End Date Taking? Authorizing Provider  amiodarone (PACERONE) 400 MG tablet Take 1 tablet (400 mg total) by mouth 3 (three) times daily for 20 days. Patient not taking: Reported on 11/04/2018 11/02/18 11/22/18  Arrien, York Ram, MD  carvedilol (COREG) 25 MG tablet Take 1 tablet (25 mg total) by mouth 2 (two) times daily with a meal. Patient not taking: Reported on 11/04/2018 11/02/18   Arrien, York Ram, MD  furosemide (LASIX) 40 MG tablet Take 2 tablets (80 mg total) by mouth 2 (two) times daily for 30 days. Patient not taking: Reported on 11/04/2018 11/02/18 12/02/18  Arrien, York Ram, MD  hydrALAZINE (APRESOLINE) 50 MG tablet Take 1 tablet (50 mg total) by mouth 3 (three) times daily for 30 days. Patient not taking: Reported on 11/04/2018 11/02/18 12/02/18  Arrien, York Ram, MD  lisinopril (PRINIVIL,ZESTRIL) 40 MG tablet Take 1 tablet (40 mg total) by mouth daily for 30 days. Patient not taking: Reported on 11/04/2018 11/02/18 12/02/18  Arrien, York Ram, MD  metolazone (ZAROXOLYN) 2.5 MG tablet Take 1 tablet (2.5 mg total) by mouth daily as needed (in case of weight gain 3 lbs in 24 hours, or 5 lbs in 7 days.). Patient not taking: Reported on 11/04/2018 11/02/18   Arrien, York Ram, MD  potassium chloride SA (K-DUR,KLOR-CON) 20 MEQ tablet Take 1  tablet (20 mEq total) by mouth 2 (two) times daily for 30 days. Patient not taking: Reported on 11/04/2018 11/02/18 12/02/18  Arrien, York Ram, MD  predniSONE (DELTASONE) 10 MG tablet Take 1 tablet (10 mg total) by mouth daily with breakfast for 3 days. Patient not taking: Reported on 11/04/2018 11/03/18 11/06/18  Arrien, York Ram, MD  Rivaroxaban (XARELTO) 15 MG TABS tablet Take 1 tablet (15 mg total) by mouth daily with supper for 30 days. Patient not taking: Reported on 11/04/2018 11/02/18 12/02/18  Arrien, York Ram, MD    Family History  Family History  Problem Relation Age of Onset  . Asthma Mother        alive and well  . Hypertension Father        alive  . Lung cancer Father   . Cancer Father     Social History Social History   Tobacco Use  . Smoking status: Never Smoker  . Smokeless tobacco: Never Used  Substance Use Topics  . Alcohol use: No  . Drug use: No     Allergies   Patient has no known allergies.   Review of Systems Review of Systems  Constitutional: Negative for appetite change and fatigue.  HENT: Negative for congestion, ear discharge and sinus pressure.   Eyes: Negative for discharge.  Respiratory: Positive for shortness of breath. Negative for cough.   Cardiovascular: Positive for palpitations. Negative for chest pain.  Gastrointestinal: Negative for abdominal pain and diarrhea.  Genitourinary: Negative for frequency and hematuria.  Musculoskeletal: Negative for back pain.  Skin: Negative for rash.  Neurological: Negative for seizures and headaches.  Psychiatric/Behavioral: Negative for hallucinations.     Physical Exam Updated Vital Signs BP 121/80   Pulse 89   Temp 98.8 F (37.1 C) (Oral)   Resp (!) 22   Ht 6' (1.829 m)   Wt 115 kg   SpO2 99%   BMI 34.38 kg/m   Physical Exam Vitals signs reviewed.  Constitutional:      Appearance: He is well-developed.  HENT:     Head: Normocephalic.     Nose: Nose normal.  Eyes:     General: No scleral icterus.    Conjunctiva/sclera: Conjunctivae normal.  Neck:     Musculoskeletal: Neck supple.     Thyroid: No thyromegaly.  Cardiovascular:     Heart sounds: No murmur. No friction rub. No gallop.      Comments: Rapid irregular heartbeat Pulmonary:     Breath sounds: No stridor. No wheezing or rales.  Chest:     Chest wall: No tenderness.  Abdominal:     General: There is no distension.     Tenderness: There is no abdominal tenderness. There is no rebound.  Musculoskeletal: Normal range of motion.  Lymphadenopathy:      Cervical: No cervical adenopathy.  Skin:    Findings: No erythema or rash.  Neurological:     Mental Status: He is oriented to person, place, and time.     Motor: No abnormal muscle tone.     Coordination: Coordination normal.  Psychiatric:        Behavior: Behavior normal.      ED Treatments / Results  Labs (all labs ordered are listed, but only abnormal results are displayed) Labs Reviewed  BASIC METABOLIC PANEL - Abnormal; Notable for the following components:      Result Value   Glucose, Bld 109 (*)    BUN 25 (*)    Creatinine, Ser 1.37 (*)  All other components within normal limits  CBC - Abnormal; Notable for the following components:   WBC 10.7 (*)    Hemoglobin 11.4 (*)    HCT 38.5 (*)    MCH 24.7 (*)    MCHC 29.6 (*)    RDW 18.3 (*)    All other components within normal limits  HEPATIC FUNCTION PANEL - Abnormal; Notable for the following components:   Bilirubin, Direct 0.3 (*)    All other components within normal limits  TROPONIN I - Abnormal; Notable for the following components:   Troponin I 0.08 (*)    All other components within normal limits    EKG None  Radiology Dg Chest Portable 1 View  Result Date: 11/04/2018 CLINICAL DATA:  Shortness of breath for 2 days EXAM: PORTABLE CHEST 1 VIEW COMPARISON:  10/28/2018 FINDINGS: Cardiac shadow remains enlarged. The lungs are well aerated bilaterally. No focal infiltrate or sizable effusion is seen. No bony abnormality seen. IMPRESSION: Stable cardiomegaly.  No acute abnormality noted. Electronically Signed   By: Alcide Clever M.D.   On: 11/04/2018 07:59    Procedures Procedures (including critical care time)  Medications Ordered in ED Medications  diltiazem (CARDIZEM) 100 mg in dextrose 5 % 100 mL (1 mg/mL) infusion (0 mg/hr Intravenous Stopped 11/04/18 1201)  carvedilol (COREG) tablet 25 mg (25 mg Oral Given 11/04/18 1009)  amiodarone (PACERONE) tablet 400 mg (400 mg Oral Given 11/04/18 0846)  diltiazem  (CARDIZEM) injection 10 mg (10 mg Intravenous Given 11/04/18 0850)  hydrALAZINE (APRESOLINE) tablet 50 mg (50 mg Oral Given 11/04/18 1009)  rivaroxaban (XARELTO) tablet 20 mg (20 mg Oral Given 11/04/18 1101)     Initial Impression / Assessment and Plan / ED Course  I have reviewed the triage vital signs and the nursing notes.  Pertinent labs & imaging results that were available during my care of the patient were reviewed by me and considered in my medical decision making (see chart for details).       CRITICAL CARE Performed by: Bethann Berkshire Total critical care time40 minutes Critical care time was exclusive of separately billable procedures and treating other patients. Critical care was necessary to treat or prevent imminent or life-threatening deterioration. Critical care was time spent personally by me on the following activities: development of treatment plan with patient and/or surrogate as well as nursing, discussions with consultants, evaluation of patient's response to treatment, examination of patient, obtaining history from patient or surrogate, ordering and performing treatments and interventions, ordering and review of laboratory studies, ordering and review of radiographic studies, pulse oximetry and re-evaluation of patient's condition. Patient with rapid A. fib that was controlled by Cardizem.  He just left hospital couple days ago and has not been taking his medicines.  I gave him his normal dose of Lopressor and amiodarone.  I spoke with cardiology and they stated that stop the Cardizem and after 2 to 3 hours if he is rate controlled back on his p.o. medicines he can be discharged home.  He will be of His medicines today  Final Clinical Impressions(s) / ED Diagnoses   Final diagnoses:  Palpitations    ED Discharge Orders    None       Bethann Berkshire, MD 11/04/18 1306

## 2018-11-04 NOTE — ED Notes (Addendum)
PT was admitted on 10/28/2018 for Afib RVR and right knee effusion and states since recent hospitalization has not been able to go get his medications. I printed the AVS and gave it to patient. [facility] [place].

## 2018-11-08 NOTE — Progress Notes (Deleted)
Subjective:    Patient ID: Cody [name], male    DOB: June 03, 1971, 48 y.o.   MRN: [id number]  48 y.o.M here for post hosp f/u for acute CHF.  The patient was admitted on November 30 and discharged on  December 2nd for acute heart failure   Past  medical history significant ofsystolic dysfunction CHF with previous 2017 echocardiogram showing EF of 55 - 60%, hypertension, atrial fibrillation on chronic anticoagulation. Medication noncompliance. History of DVT on chronic kidney disease stage III   The patient had been admitted between November 30 and December 2  08/02/2018 Since the office visit on 07/27/2018 the patient states his dyspnea is unchanged.  It is worse with exertion.  There is also associated dizziness with any walking.  He states when he swallows he feels as though food is sticking in the throat.  There is no cough.  There is no chest pain.  The patient denies any further hematuria.  The stools now are more regular and softer on Colace.  The patient's weight has increased. As below: Wt Readings from Last 3 Encounters: 08/02/18 : 289 lb (131.1 kg) 07/27/18 : 286 lb (129.7 kg) 07/18/18 : 290 lb (131.5 kg)  The patient has been compliant with the furosemide Coreg and hydralazine.  The patient also continues the Xarelto  The patient's last echocardiogram was in 2017 and showed an ejection fraction of 55 to 60%.  Patient does have documented chronic atrial fibrillation. Patient has not been seen by cardiology previously.  11/08/2018 NOt seen in this clinic since 08/02/18  The pt did not get echo done as ordered and lost to f/u  Went to ED twice 1/20 and 2/20, second time admitted D/c summary as below: Admit date: 10/28/2018 Discharge date: 11/02/2018  Admitted From: Home  Disposition:  Home   Recommendations for Outpatient Follow-up and new medication changes:  1. Follow up with Dr. Rosemary Holms on Thursday, Cardiology.  2. Patient is requesting to be discharged,  he has a important orientation for a new job that he can not miss. I explained him that his heart rate is still not controlled and he has high risk for decompensation. He understands the risk and still wants to be discharge in order to assist to his job orientation. I will give him his prescriptions and have social worked help with his medications.  3. Patient has been resumed on carvedilol, heart rate is still not controlled, with peaks up to 130, will add amiodarone per cardiology recommendations.   4. Patient will resume diuresis with furosemide and will change metolazone to use as needed.  5. Follow up with Dr. Delford Field in 7 days.   Home Health:no   Equipment/Devices: no    Discharge Condition: stable  CODE STATUS: full  Diet recommendation: heart healthy and heart failure prudent.   Brief/Interim Summary: 48 year old male who presented with dyspnea. He does have significant past medical history for atrial fibrillation, hypertension, chronic kidney disease stage III, chronic anemia, and history of DVT. Reported 2 days of dyspnea, worse with exertion, andassociated with chest tightness.Apparently he ran out of his medications 2 days ago. Has not taken rivaroxaban for the last 3 weeks. On his initial physical examination his heart rate was 142 bpm, temperature 98.6,blood pressure 181/157, respiratory rate 27, oxygen saturation 100%.His lungs were clear to auscultation, heart S1-S2 present, irregularly irregular, abdomen soft, positive right knee edema. Sodium 138, potassium 3.1, chloride 102, bicarbonate 30, glucose 95, BUN 24, creatinine 1.81, troponin 0.14,white count  9.7, hemoglobin 11.5, hematocrit 37.0, platelets 380.His chest x-ray had cardiomegaly, vascular congestion and fluid in the right fissure.His EKG had atrial fibrillation rhythm at 140 bpm, right axis, left bundle branch block, t waveinversions in the inferior lateral leads with poor R wave progression.  Patient was  admitted to the hospital working diagnosis of atrial fibrillation with rapid ventricular response,complicated by decompensated heart failure.  1.  Atrial fibrillation with rapid ventricular response, complicated with acute on chronic systolic heart failure/ nonischemic cardiomyopathy, LV systolic function 20 to 25% with moderately increased left ventricular cavity.  Patient was admitted to the stepdown unit, he initially was placed on a diltiazem drip with improvement of his heart rate, he was transitioned to beta-blockade with metoprolol and then change to carvedilol.  His heart rate still in the 100s with peaks of 130s, anticoagulation with rivaroxaban.  Due to uncontrolled heart rate amiodarone has been started 400 mg three time daily, follow-up with the cardiology clinic in 48 hours.  Patient has requested to be discharged, even though his heart rate is not still fully controlled, he understands the risk of leaving the hospital without appropriate rate control, risk of rapid decompensation and death.  He understands the risk and is still willing to be discharged. He will follow-up with cardiology in 48 hours.  Will assist him in getting his medications.  Patient was diuresed with IV furosemide, negative fluid balance was achieved, (he lost about 5 kg, discharge weight 115 kg from admission 120 kg), with significant improvement of his symptoms.  Patient will continue taking furosemide 80 mg twice daily and as needed metolazone.  Continue potassium supplements.  Patient will need further cardiac work-up, he will follow-up with Dr. Rosemary Holms in the outpatient cardiology clinic.   2.  Acute kidney injury on chronic kidney disease stage III with hypokalemia.  Patient responded well to diuresis with furosemide, his discharge creatinine is 1.54, potassium 4.0 and serum bicarbonate of 28.  Patient will continue taking furosemide 80 mg twice daily along with as needed metolazone.  Will need close follow-up  of kidney function electrolytes.  Continue potassium supplements.  3.  Uncontrolled hypertension/urgency.  She was resumed on hydralazine and lisinopril with improvement of his blood pressure.  4.  Right knee osteoarthritis.  Patient had significant right knee pain, further work-up with knee MRI shows significant  tricompartmental degenerative changes most severe in the lateral compartment, large complex effusion and synovitis.  Severely degenerated and torn lateral meniscus.  Patient was seen by physical therapy and occupational therapy, a trial of prednisone was started with improvement of his symptoms.  Orthopedics was contacted and recommended continued supportive medical therapy.  5.  Reported history of DVT.  Continue anticoagulation with rivaroxaban.  6.  Obesity.  Calculated BMI 36, outpatient follow-up.  Discharge Diagnoses:  Principal Problem:   Atrial fibrillation with RVR (HCC) Active Problems:   CKD (chronic kidney disease) stage 3, GFR 30-59 ml/min (HCC)   Obstructive sleep apnea   Hypertensive urgency   A-fib (HCC)  Pt has yet to see cardiology and went back to ED 3/19 for more cardiazem and did not fill/take his meds from hosp d/c 3/17  No VM on cell, cards not able to reach the pt for TOCvisit      Congestive Heart Failure  Presents for follow-up (hx of CHF systolic dx 2 months, CKD is new) visit. Associated symptoms include chest pressure, edema, fatigue, muscle weakness, near-syncope, orthopnea, palpitations, paroxysmal nocturnal dyspnea, shortness of breath and unexpected  weight change. Pertinent negatives include no abdominal pain, chest pain, claudication or nocturia. (Constipation is noted  Notes R sided knee pain Leg edema is better ) The symptoms have been worsening. Compliance problems include medication cost and adherence to diet (not able to work on any jobs and so no dollars or med support).  Compliance with diet is 0-25%. Compliance with exercise is  0-25%. Compliance with medications is 76-100%.    Past Medical History:  Diagnosis Date   Atrial fibrillation (HCC)    CKD (chronic kidney disease), stage III (HCC)    [name]/notes 02/20/2016   DVT (deep venous thrombosis) (HCC) 01/2016   a. RLE - D[place] [facility] - Took Xarelto for 30 days - out x 2 wks.   Dyspnea    Hypertension    Hypertensive heart disease with CHF (congestive heart failure) (HCC)      Family History  Problem Relation Age of Onset   Asthma Mother        alive and well   Hypertension Father        alive   Lung cancer Father    Cancer Father      Social History   Socioeconomic History   Marital status: Legally Separated    Spouse name: Not on file   Number of children: Not on file   Years of education: Not on file   Highest education level: Not on file  Occupational History   Not on file  Social Needs   Financial resource strain: Not on file   Food insecurity:    Worry: Not on file    Inability: Not on file   Transportation needs:    Medical: Not on file    Non-medical: Not on file  Tobacco Use   Smoking status: Never Smoker   Smokeless tobacco: Never Used  Substance and Sexual Activity   Alcohol use: No   Drug use: No   Sexual activity: Yes  Lifestyle   Physical activity:    Days per week: Not on file    Minutes per session: Not on file   Stress: Not on file  Relationships   Social connections:    Talks on phone: Not on file    Gets together: Not on file    Attends religious service: Not on file    Active member of club or organization: Not on file    Attends meetings of clubs or organizations: Not on file    Relationship status: Not on file   Intimate partner violence:    Fear of current or ex partner: Not on file    Emotionally abused: Not on file    Physically abused: Not on file    Forced sexual activity: Not on file  Other Topics Concern   Not on file  Social History Narrative   [place]   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     No Known Allergies   Outpatient Medications Prior to Visit  Medication Sig Dispense Refill   amiodarone (PACERONE) 400 MG tablet Take 1 tablet (400 mg total) by mouth 3 (three) times daily for 20 days. (Patient not taking: Reported on 11/04/2018) 60 tablet 0   carvedilol (COREG) 25 MG tablet Take 1 tablet (25 mg total) by mouth 2 (two) times daily with a meal. (Patient not taking: Reported on 11/04/2018) 60 tablet 0   furosemide (LASIX) 40 MG tablet Take 2 tablets (80 mg total) by  mouth 2 (two) times daily for 30 days. (Patient not taking: Reported on 11/04/2018) 120 tablet 0   hydrALAZINE (APRESOLINE) 50 MG tablet Take 1 tablet (50 mg total) by mouth 3 (three) times daily for 30 days. (Patient not taking: Reported on 11/04/2018) 90 tablet 0   lisinopril (PRINIVIL,ZESTRIL) 40 MG tablet Take 1 tablet (40 mg total) by mouth daily for 30 days. (Patient not taking: Reported on 11/04/2018) 30 tablet 0   metolazone (ZAROXOLYN) 2.5 MG tablet Take 1 tablet (2.5 mg total) by mouth daily as needed (in case of weight gain 3 lbs in 24 hours, or 5 lbs in 7 days.). (Patient not taking: Reported on 11/04/2018) 20 tablet 6   potassium chloride SA (K-DUR,KLOR-CON) 20 MEQ tablet Take 1 tablet (20 mEq total) by mouth 2 (two) times daily for 30 days. (Patient not taking: Reported on 11/04/2018) 60 tablet 0   Rivaroxaban (XARELTO) 15 MG TABS tablet Take 1 tablet (15 mg total) by mouth daily with supper for 30 days. (Patient not taking: Reported on 11/04/2018) 30 tablet 0   No facility-administered medications prior to visit.      Review of Systems  Constitutional: Positive for fatigue and unexpected weight change.  Respiratory: Positive for cough and shortness of breath. Negative for chest tightness and wheezing.   Cardiovascular: Positive for palpitations and near-syncope. Negative for chest pain, claudication and leg swelling.    Gastrointestinal: Positive for constipation. Negative for abdominal distention, abdominal pain, nausea and vomiting.  Genitourinary: Positive for hematuria. Negative for nocturia.  Musculoskeletal: Positive for muscle weakness.  Skin: Negative.   Neurological: Positive for weakness. Negative for dizziness, light-headedness and headaches.       Objective:   Physical Exam  There were no vitals filed for this visit.  Gen: Pleasant, well-nourished, in no distress,  normal affect  ENT: No lesions,  mouth clear,  oropharynx clear, no postnasal drip  Neck: 2+ JVD, no TMG, no carotid bruits  Lungs: No use of accessory muscles, no dullness to percussion, distant BS  Cardiovascular:irreg irreg, heart sounds normal, no murmur or gallops, 4+  peripheral edema to pre tibial area  Abdomen: distended  ++ascites,  no HSM,  BS normal  Musculoskeletal: No deformities, no cyanosis or clubbing  Neuro: alert, non focal  Skin: Warm, no lesions or rashes     CXR 11/30: CM, mild edema  CBC Latest Ref Rng & Units 11/04/2018 10/29/2018 10/28/2018  WBC 4.0 - 10.5 K/uL 10.7(H) 9.3 9.7  Hemoglobin 13.0 - 17.0 g/dL 11.4(L) 11.3(L) 11.5(L)  Hematocrit 39.0 - 52.0 % 38.5(L) 37.6(L) 37.0(L)  Platelets 150 - 400 K/uL 386 336 380   BMP Latest Ref Rng & Units 11/04/2018 11/02/2018 11/01/2018  Glucose 70 - 99 mg/dL 376(E) 831(D) 176(H)  BUN 6 - 20 mg/dL 60(V) 37(T) 06(Y)  Creatinine 0.61 - 1.24 mg/dL 6.94(W) 5.46(E) 7.03(J)  BUN/Creat Ratio 9 - 20 - - -  Sodium 135 - 145 mmol/L 140 137 137  Potassium 3.5 - 5.1 mmol/L 3.5 4.0 4.3  Chloride 98 - 111 mmol/L 104 101 102  CO2 22 - 32 mmol/L 27 28 26   Calcium 8.9 - 10.3 mg/dL 9.0 9.6 9.6   CrCl  00/9 48 >> 12/10 52     Assessment & Plan:  I personally reviewed all images and lab data in the [facility] system as well as any outside material available during this office visit and agree with the  radiology impressions.   No problem-specific Assessment & Plan notes  found for this encounter.   There are no diagnoses linked to this encounter.

## 2018-11-09 ENCOUNTER — Inpatient Hospital Stay: Payer: Self-pay | Admitting: Critical Care Medicine

## 2018-11-29 ENCOUNTER — Telehealth: Payer: Self-pay

## 2018-11-29 NOTE — Telephone Encounter (Signed)
Yes. He will need to be seen in the office to avoid the risk of recurrent hospitalization.   Thanks MJP

## 2018-12-02 MED FILL — LISINOPRIL 40 MG TABLET: 40 | 30 days supply | Qty: 30 | Fill #2

## 2018-12-02 MED FILL — CARVEDILOL 25 MG TABLET: 25 | 30 days supply | Qty: 60 | Fill #2

## 2018-12-06 ENCOUNTER — Encounter: Payer: Self-pay | Admitting: Cardiology

## 2018-12-07 ENCOUNTER — Other Ambulatory Visit: Payer: Self-pay

## 2018-12-07 ENCOUNTER — Encounter: Payer: Self-pay | Admitting: Cardiology

## 2018-12-07 ENCOUNTER — Ambulatory Visit (INDEPENDENT_AMBULATORY_CARE_PROVIDER_SITE_OTHER): Payer: Medicaid Other | Admitting: Cardiology

## 2018-12-07 VITALS — BP 195/128 | HR 75 | Ht 72.0 in | Wt 263.0 lb

## 2018-12-07 DIAGNOSIS — R7989 Other specified abnormal findings of blood chemistry: Secondary | ICD-10-CM

## 2018-12-07 DIAGNOSIS — I4819 Other persistent atrial fibrillation: Secondary | ICD-10-CM

## 2018-12-07 DIAGNOSIS — R778 Other specified abnormalities of plasma proteins: Secondary | ICD-10-CM

## 2018-12-07 DIAGNOSIS — M25561 Pain in right knee: Secondary | ICD-10-CM

## 2018-12-07 DIAGNOSIS — I5022 Chronic systolic (congestive) heart failure: Secondary | ICD-10-CM | POA: Diagnosis not present

## 2018-12-07 DIAGNOSIS — I10 Essential (primary) hypertension: Secondary | ICD-10-CM

## 2018-12-07 DIAGNOSIS — G8929 Other chronic pain: Secondary | ICD-10-CM

## 2018-12-07 NOTE — Progress Notes (Signed)
Follow up visit  Subjective:   Cody [name], male    DOB: 07-Dec-1970, 48 y.o.   MRN: [id number]   Chief Complaint  Patient presents with  . Palpitations    hospital f/u , pt wants to know about disability  . Shortness of Breath     HPI  48 y.o. African American male  with hypertension, possible OSA, h/o RLE DVT (2017), persistent Afib, CKD3, now with new diagnosis of HFrEF.  Patient has had multiple hospital/ED admissions in the last year or 2.  I saw the patient for the first time in a consultative role on 11/01/2018.  Patient missed a follow-up appointment with me since then and was in the emergency room again. 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 He continues to have shortness of breath with minimal activity. His leg edema has improved, but still persists. He denies any chest pain. He complains of right knee pain. He has been seen in urgent care for this and was given a course of prednisone, but with no improvement.   He reports compliance with his medical therapy. He does not follow low salt diet.   He inquires about seeking disability due to his medical ailments.   Past Medical History:  Diagnosis Date  . Atrial fibrillation (HCC)   . CKD (chronic kidney disease), stage III (HCC)    Cody [name] 02/20/2016  . DVT (deep venous thrombosis) (HCC) 01/2016   a. RLE - Dx 01/2016 in [name] - Took Xarelto for 30 days - out x 2 wks.  [name]   . Hypertension   . Hypertensive heart disease with CHF (congestive heart failure) (HCC)      Past Surgical History:  Procedure Laterality Date  . NO PAST SURGERIES       Social History   Socioeconomic History  . Marital status: Legally Separated    Spouse name: Not on file  . Number of children: 1  . Years of education: Not on file  . Highest education level: Not on file  Occupational History  . Not on file  Social Needs  . Financial resource strain: Not on file  . Food insecurity:    Worry: Not  on file    Inability: Not on file  . Transportation needs:    Medical: Not on file    Non-medical: Not on file  Tobacco Use  . Smoking status: Never Smoker  . Smokeless tobacco: Never Used  Substance and Sexual Activity  . Alcohol use: No  . Drug use: No  . Sexual activity: Yes  Lifestyle  . Physical activity:    Days per week: Not on file    Minutes per session: Not on file  . Stress: Not on file  Relationships  . Social connections:    Talks on phone: Not on file    Gets together: Not on file    Attends religious service: Not on file    Active member of club or organization: Not on file    Attends meetings of clubs or organizations: Not on file    Relationship status: Not on file  . Intimate partner violence:    Fear of current or ex partner: Not on file    Emotionally abused: Not on file    Physically abused: Not on file    Forced sexual activity: Not on file  Other Topics Concern  . Not on file  Social History Narrative   Lives in [place].  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     Family History  Problem Relation Age of Onset  . Asthma Mother        alive and well  . Hypertension Father        alive  . Lung cancer Father   . Cancer Father      Current Outpatient Medications on File Prior to Visit  Medication Sig Dispense Refill  . amiodarone (PACERONE) 400 MG tablet Take 400 mg by mouth 3 (three) times daily.    . carvedilol (COREG) 25 MG tablet Take 25 mg by mouth 2 (two) times daily with a meal.    . furosemide (LASIX) 40 MG tablet Take 80 mg by mouth 2 (two) times daily.    . hydrALAZINE (APRESOLINE) 50 MG tablet Take 50 mg by mouth 3 (three) times daily.    [name] lisinopril (ZESTRIL) 40 MG tablet Take 40 mg by mouth daily.    . metolazone (ZAROXOLYN) 2.5 MG tablet Take 2.5 mg by mouth as needed.    . potassium chloride SA (K-DUR) 20 MEQ tablet Take 20 mEq by mouth 2 (two) times daily.    . Rivaroxaban (XARELTO) 15 MG TABS  tablet Take 15 mg by mouth daily.     No current facility-administered medications on file prior to visit.     Cardiovascular studies:  EKG 12/07/2018: Atrial fibrillation with controlled ventricular rate Left bundle branch block.   EKG 11/04/2018: Afib w/RVR. IVCD  LE [place] 11/02/2018: Right: There is no evidence of deep vein thrombosis in the lower extremity. No cystic structure found in the popliteal fossa.  Echocardiogram 10/29/2018:  1. The left ventricle has severely reduced systolic function, with an ejection fraction of 20-25%. The cavity size was mildly dilated. There is moderately increased left ventricular wall thickness. Left ventricular diastolic function could not be  evaluated secondary to atrial fibrillation.  2. The right ventricle has moderately reduced systolic function. The cavity was normal. There is mildly increased right ventricular wall thickness.  3. Left atrial size was moderately dilated.  4. The aortic valve is tricuspid Aortic valve regurgitation is trivial by color flow Doppler.  5. The aortic root and ascending aorta are normal in size and structure.  6. The inferior vena cava was dilated in size with <50% respiratory variability.  7. The interatrial septum was not assessed.  Recent labs: Results for [name], [name] (MRN [id number]) as of 12/07/2018 11:20  Ref. Range 11/04/2018 08:13 11/04/2018 08:31  BASIC METABOLIC PANEL Unknown Rpt (A)   Sodium Latest Ref Range: 135 - 145 mmol/L 140   Potassium Latest Ref Range: 3.5 - 5.1 mmol/L 3.5   Chloride Latest Ref Range: 98 - 111 mmol/L 104   CO2 Latest Ref Range: 22 - 32 mmol/L 27   Glucose Latest Ref Range: 70 - 99 mg/dL 409 (H)   BUN Latest Ref Range: 6 - 20 mg/dL 25 (H)   Creatinine Latest Ref Range: 0.61 - 1.24 mg/dL 8.11 (H)   Calcium Latest Ref Range: 8.9 - 10.3 mg/dL 9.0   Anion gap Latest Ref Range: 5 - 15  9   Alkaline Phosphatase Latest Ref Range: 38 - 126 U/L  85  Albumin Latest Ref Range: 3.5 -  5.0 g/dL  3.5  AST Latest Ref Range: 15 - 41 U/L  21  ALT Latest Ref Range: 0 - 44 U/L  20  Total Protein Latest Ref Range: 6.5 - 8.1 g/dL  6.7  Bilirubin, Direct Latest Ref Range: 0.0 - 0.2 mg/dL  0.3 (H)  Indirect Bilirubin Latest Ref Range: 0.3 - 0.9 mg/dL  0.8  Total Bilirubin Latest Ref Range: 0.3 - 1.2 mg/dL  1.1  GFR, Est Non African American Latest Ref Range: >60 mL/min >60   GFR, Est African American Latest Ref Range: >60 mL/min >60    Results for EVERICK, PADLO (MRN [id number]) as of 12/07/2018 11:20  Ref. Range 11/04/2018 08:13  WBC Latest Ref Range: 4.0 - 10.5 K/uL 10.7 (H)  RBC Latest Ref Range: 4.22 - 5.81 MIL/uL 4.62  Hemoglobin Latest Ref Range: 13.0 - 17.0 g/dL 19.7 (L)  HCT Latest Ref Range: 39.0 - 52.0 % 38.5 (L)  MCV Latest Ref Range: 80.0 - 100.0 fL 83.3  MCH Latest Ref Range: 26.0 - 34.0 pg 24.7 (L)  MCHC Latest Ref Range: 30.0 - 36.0 g/dL 58.8 (L)  RDW Latest Ref Range: 11.5 - 15.5 % 18.3 (H)  Platelets Latest Ref Range: 150 - 400 K/uL 386  nRBC Latest Ref Range: 0.0 - 0.2 % 0.0   Results for JAROM, ROSENSTOCK (MRN [id number]) as of 12/07/2018 11:20  Ref. Range 02/20/2016 14:53 10/31/2016 00:15 10/31/2016 03:40 10/29/2018 03:12 10/29/2018 09:18 10/29/2018 15:03 11/04/2018 08:31  Troponin I Latest Ref Range: <0.03 ng/mL 0.20 (HH) 0.10 (HH) 0.14 (HH) 0.09 (HH) 0.07 (HH) 0.06 (HH) 0.08 (HH)    Review of Systems  Constitution: Negative for decreased appetite, malaise/fatigue, weight gain and weight loss.  HENT: Negative for congestion.   Eyes: Negative for visual disturbance.  Cardiovascular: Positive for dyspnea on exertion and leg swelling. Negative for chest pain, claudication, palpitations and syncope.  Respiratory: Positive for shortness of breath.   Endocrine: Negative for cold intolerance.  Hematologic/Lymphatic: Does not bruise/bleed easily.  Skin: Negative for itching and rash.  Musculoskeletal: Positive for joint pain (Right knee) and joint swelling (Right knee).  Negative for myalgias.  Gastrointestinal: Negative for abdominal pain, nausea and vomiting.  Genitourinary: Negative for dysuria.  Neurological: Negative for dizziness and weakness.  Psychiatric/Behavioral: The patient is not nervous/anxious.   All other systems reviewed and are negative.        Vitals:   12/07/18 1108 12/07/18 1200  BP: (!) 180/121 (!) 195/128  Pulse: 75 75  SpO2: 95% 97%    Objective:   Physical Exam  Constitutional: He is oriented to person, place, and time. He appears well-developed and well-nourished. No distress.  HENT:  Head: Normocephalic and atraumatic.  Eyes: Pupils are equal, round, and reactive to light. Conjunctivae are normal.  Neck: No JVD present.  Cardiovascular: Normal rate and intact distal pulses. An irregularly irregular rhythm present.  No murmur heard. Pulmonary/Chest: Effort normal and breath sounds normal. He has no wheezes. He has no rales.  Abdominal: Soft. Bowel sounds are normal. There is no rebound.  Musculoskeletal:        General: Tenderness (Right knee. Swelling noted) and edema (1-2+ b/l) present.  Lymphadenopathy:    He has no cervical adenopathy.  Neurological: He is alert and oriented to person, place, and time. No cranial nerve deficit.  Skin: Skin is warm and dry.  Psychiatric: He has a normal mood and affect.  Nursing note and vitals reviewed.         Assessment & Recommendations:   48 y.o. African American male  with hypertension, possible OSA, h/o RLE DVT (2017), persistent Afib, CKD3, now with new diagnosis of HFrEF.  1. Chronic systolic heart failure (HCC)  Most likely etiology is hypertensive cardiomyopathy, although ischemic cardiomyopathy, as well as amyloidosis remain in the differential. Blood pressure remains uncontrolled. He has CKD 3. I do not have labs within past month. Continue lisinopril 40 mg daily, coreg 25 mg bid. Added Bidil 20-37.5 mg bid (samples given). Continue lasix.  I will obtain BMP  today. He will need addition of spironolactone, as well as switching to [facility] in the future. At some point, he will need either noninasive or invasive evaluation of his coronaries.   2. Elevated troponin Seen on multiple ED admissions. I do not think this reflects type 1 MI, likely reflects type 2 MI due to heart failure.   3. Persistent atrial fibrillation Continue coreg 25 mg bid. Given that he remains in Afib on amiodarone, I have stopped amiodarone. Will persue rate control therapy for now. He will need cardioversion at some point. CHA2DS2VASc score 3. Annual stroke risk 4% He is currently on Xarelto 15 mg daily. Based on his BMP, he may need adjustment of his dose to 20 mg.  4. Essential hypertension Uncontrolled. Changes made as above.   5. Right knee pain: I think this is unrelated to heart failure. Suspect knee effusion ?arthritis. Needs follow up with PCP.    I informed the patient that I am not certified to provide disability claims. He should check with his PCP, but will likely need to go to social security office. I offered him contact information to seek [employer].   I will see him back in 1 week, in person. He remains at high risk of recurrent heart failure admission.    Elder Negus, MD [facility] [place] Pager: [phone] Office: [phone] If no answer Cell [phone]

## 2018-12-08 ENCOUNTER — Encounter: Payer: Self-pay | Admitting: Cardiology

## 2018-12-08 DIAGNOSIS — M25561 Pain in right knee: Secondary | ICD-10-CM | POA: Insufficient documentation

## 2018-12-08 MED ORDER — ISOSORB DINITRATE-HYDRALAZINE 20-37.5 MG PO TABS: 1.0000 | ORAL_TABLET | Freq: Two times a day (BID) | ORAL | Status: DC

## 2018-12-10 ENCOUNTER — Encounter (HOSPITAL_COMMUNITY): Payer: Self-pay | Admitting: Emergency Medicine

## 2018-12-10 ENCOUNTER — Other Ambulatory Visit: Payer: Self-pay

## 2018-12-10 ENCOUNTER — Emergency Department (HOSPITAL_COMMUNITY)
Admission: EM | Admit: 2018-12-10 | Discharge: 2018-12-10 | Disposition: A | Discharge status: Home / Self Care | Payer: Medicaid Other | Attending: Emergency Medicine | Admitting: Emergency Medicine

## 2018-12-10 DIAGNOSIS — M25561 Pain in right knee: Secondary | ICD-10-CM | POA: Diagnosis present

## 2018-12-10 DIAGNOSIS — I5022 Chronic systolic (congestive) heart failure: Secondary | ICD-10-CM | POA: Insufficient documentation

## 2018-12-10 DIAGNOSIS — M25461 Effusion, right knee: Secondary | ICD-10-CM | POA: Insufficient documentation

## 2018-12-10 DIAGNOSIS — Z9114 Patient's other noncompliance with medication regimen: Secondary | ICD-10-CM | POA: Diagnosis not present

## 2018-12-10 DIAGNOSIS — Z79899 Other long term (current) drug therapy: Secondary | ICD-10-CM | POA: Insufficient documentation

## 2018-12-10 DIAGNOSIS — N183 Chronic kidney disease, stage 3 (moderate): Secondary | ICD-10-CM | POA: Insufficient documentation

## 2018-12-10 DIAGNOSIS — Z7901 Long term (current) use of anticoagulants: Secondary | ICD-10-CM | POA: Insufficient documentation

## 2018-12-10 DIAGNOSIS — I130 Hypertensive heart and chronic kidney disease with heart failure and stage 1 through stage 4 chronic kidney disease, or unspecified chronic kidney disease: Secondary | ICD-10-CM | POA: Insufficient documentation

## 2018-12-10 MED ORDER — LIDOCAINE HCL (PF) 1 % IJ SOLN
30.0000 mL | Freq: Once | INTRAMUSCULAR | Status: AC
  Administered 2018-12-10: 30 mL
  Filled 2018-12-10: qty 30

## 2018-12-10 NOTE — ED Triage Notes (Signed)
Pt has right knee pain that started 1 week ago pt also states swelling started in the last few days. Denies any injury

## 2018-12-10 NOTE — ED Notes (Signed)
ED Provider at bedside. 

## 2018-12-10 NOTE — ED Provider Notes (Signed)
[facility] EMERGENCY DEPARTMENT Provider Note   CSN: [id number] Arrival date & time: 12/10/18  1459    History   Chief Complaint Chief Complaint  Patient presents with  . Knee Pain    HPI Cody [name] is a 48 y.o. male who  has a past medical history of Atrial fibrillation (HCC), CKD (chronic kidney disease), stage III (HCC), DVT (deep venous thrombosis) (HCC) (01/2016), Dyspnea, Hypertension, and Hypertensive heart disease with CHF (congestive heart failure) (HCC). He presents with R knee swelling and pain. He is a history of chronic knee pain and arthritis.  He has history of previous effusions.  2 days ago his knee spontaneously swelled.  He is able to ambulate but he has a lot of pain.  He denies any numbness or tingling.  Patient has a history of A. fib and DVT and is supposed to be taking Xarelto but has been out of his medications for the past 3 weeks.  He denies any injuries to the knee, fever, chill, pain in other joints.  He denies a history of gout or pseudogout.     HPI  Past Medical History:  Diagnosis Date  . Atrial fibrillation (HCC)   . CKD (chronic kidney disease), stage III (HCC)    [name] 02/20/2016  . DVT (deep venous thrombosis) (HCC) 01/2016   a. RLE - Dx 01/2016 in [name] - Took Xarelto for 30 days - out x 2 wks.  [name]   . Hypertension   . Hypertensive heart disease with CHF (congestive heart failure) [facility])     Patient Active Problem List   Diagnosis Date Noted  . Chronic pain of right knee 12/08/2018  . Atrial fibrillation with RVR (HCC) 10/29/2018  . Hypertensive urgency 10/29/2018  . A-fib (HCC) 10/29/2018  . Chronic atrial fibrillation 08/02/2018  . History of DVT (deep vein thrombosis) 07/27/2018  . Anemia due to stage 3 chronic kidney disease (HCC) 07/27/2018  . Heme positive stool 07/27/2018  . Essential hypertension 07/19/2018  . Chronic systolic heart failure (HCC) 07/17/2018  . Obstructive sleep apnea 12/11/2016  .  H/O noncompliance with medical treatment, presenting hazards to health   . Elevated troponin 02/20/2016  . CKD (chronic kidney disease) stage 3, GFR 30-59 ml/min (HCC) 02/20/2016    Past Surgical History:  Procedure Laterality Date  . NO PAST SURGERIES          Home Medications    Prior to Admission medications   Medication Sig Start Date End Date Taking? Authorizing Provider  carvedilol (COREG) 25 MG tablet Take 25 mg by mouth 2 (two) times daily with a meal.    [provider]  furosemide (LASIX) 40 MG tablet Take 80 mg by mouth 2 (two) times daily.    [provider]  hydrALAZINE (APRESOLINE) 50 MG tablet Take 50 mg by mouth 3 (three) times daily.    [provider]  lisinopril (ZESTRIL) 40 MG tablet Take 40 mg by mouth daily.    [provider]  metolazone (ZAROXOLYN) 2.5 MG tablet Take 2.5 mg by mouth as needed.    [provider]  potassium chloride SA (K-DUR) 20 MEQ tablet Take 20 mEq by mouth 2 (two) times daily.    [provider]  Rivaroxaban (XARELTO) 15 MG TABS tablet Take 15 mg by mouth daily.    [provider]    Family History Family History  Problem Relation Age of Onset  . Asthma Mother  alive and well  . Hypertension Father        alive  . Lung cancer Father   . Cancer Father     Social History Social History   Tobacco Use  . Smoking status: Never Smoker  . Smokeless tobacco: Never Used  Substance Use Topics  . Alcohol use: No  . Drug use: No     Allergies   Patient has no known allergies.   Review of Systems Review of Systems  Ten systems reviewed and are negative for acute change, except as noted in the HPI.   Physical Exam Updated Vital Signs BP (!) 166/89 (BP Location: Right Arm)   Pulse 71   Temp 98.6 F (37 C) (Oral)   Resp 15   SpO2 100%   Physical Exam Vitals signs and nursing note reviewed.  Constitutional:      General: He is not in acute distress.     Appearance: He is well-developed. He is not diaphoretic.  HENT:     Head: Normocephalic and atraumatic.  Eyes:     General: No scleral icterus.    Conjunctiva/sclera: Conjunctivae normal.  Neck:     Musculoskeletal: Normal range of motion and neck supple.  Cardiovascular:     Rate and Rhythm: Normal rate and regular rhythm.     Heart sounds: Normal heart sounds.  Pulmonary:     Effort: Pulmonary effort is normal. No respiratory distress.     Breath sounds: Normal breath sounds.  Abdominal:     Palpations: Abdomen is soft.     Tenderness: There is no abdominal tenderness.  Musculoskeletal:     Right knee: He exhibits swelling and effusion. He exhibits no ecchymosis, no deformity, no laceration and no erythema.  Skin:    General: Skin is warm and dry.  Neurological:     Mental Status: He is alert.  Psychiatric:        Behavior: Behavior normal.      ED Treatments / Results  Labs (all labs ordered are listed, but only abnormal results are displayed) Labs Reviewed - No data to display  EKG None  Radiology No results found.  Procedures .Joint Aspiration/Arthrocentesis Date/Time: 12/10/2018 11:07 PM Performed by: Arthor CaptainHarris, Virdie Penning, PA-C Authorized by: Arthor CaptainHarris, Milanie Rosenfield, PA-C   Consent:    Consent obtained:  Verbal   Consent given by:  Patient   Risks discussed:  Bleeding, infection, pain and incomplete drainage   Alternatives discussed:  No treatment Location:    Location:  Knee Anesthesia (see MAR for exact dosages):    Anesthesia method:  Local infiltration   Local anesthetic:  Lidocaine 1% w/o epi Procedure details:    Preparation: Patient was prepped and draped in usual sterile fashion     Needle gauge:  18 G   Ultrasound guidance: no     Approach:  Superior   Aspirate amount:  60 ml   Aspirate characteristics:  Blood-tinged and serous   Steroid injected: no     Specimen collected: no   Post-procedure details:    Dressing:  Adhesive bandage   Patient  tolerance of procedure:  Tolerated well, no immediate complications   (including critical care time)  Medications Ordered in ED Medications - No data to display   Initial Impression / Assessment and Plan / ED Course  I have reviewed the triage vital signs and the nursing notes.  Pertinent labs & imaging results that were available during my care of the patient were reviewed by me and  considered in my medical decision making (see chart for details).        Patient with large right knee effusion, history of the same, history of chronic knee pain and arthritis.  Aspirate showed clear serous fluid, tinged with blood.  No turbidity suggestive of infectious fluids. Patient range of motion and pain has improved greatly with removal of the fluid from the knee.  Patient given knee sleeve and crutches.  He is advised to follow closely with orthopedics.  Advised to ice several times a day.  I doubt any other emergent cause of swelling on the knee including septic joint, gout or pseudogout, hemarthrosis.  Patient is advised to begin taking his Xarelto again as directed for prevention of embolic events.  Patient appears appropriate for discharge at this time  Final Clinical Impressions(s) / ED Diagnoses   Final diagnoses:  Effusion of knee joint right    ED Discharge Orders    None       Arthor Captain, PA-C 12/10/18 2309    Charlynne Pander, MD 12/15/18 2032

## 2018-12-10 NOTE — Discharge Instructions (Addendum)
Please fill and take your xarelto as directed. You may take tylenol for your pain. Apply ice to the affected area several times a day. Contact a health care provider if you: Continue to have pain in your knee. Get help right away if you: Have swelling or redness of your knee that gets worse or does not get better. Have severe pain in your knee. Have a fever.

## 2018-12-13 NOTE — Progress Notes (Deleted)
Follow up visit  Subjective:   Cody [name], male    DOB: 1970/08/20, 48 y.o.   MRN: [id number]   Chief complaint: Cardiomyopathy  HPI  48 y.o. African American male  with hypertension, possible OSA, h/o RLE DVT (2017), persistent Afib, CKD3, now with new diagnosis of HFrEF.  At last visit on 12/07/2018, I continued his lisinopril 40 mg daily, coreg 25 mg bid, lasix, and added Bidil 20-37.5 mg bid (samples given), with plans to add spironolactone, as well as switching to [place] in the future. At some point, he will need either noninasive or invasive evaluation of his coronaries.   I ordered BMP at last visit, but he did not have this performed.   ***   Past Medical History:  Diagnosis Date  . Atrial fibrillation (HCC)   . CKD (chronic kidney disease), stage III (HCC)    Cody [name] 02/20/2016  . DVT (deep venous thrombosis) (HCC) 01/2016   a. RLE - Dx 01/2016 in [name] - Took Xarelto for 30 days - out x 2 wks.  [name]   . Hypertension   . Hypertensive heart disease with CHF (congestive heart failure) (HCC)      Past Surgical History:  Procedure Laterality Date  . NO PAST SURGERIES       Social History   Socioeconomic History  . Marital status: Legally Separated    Spouse name: Not on file  . Number of children: 1  . Years of education: Not on file  . Highest education level: Not on file  Occupational History  . Not on file  Social Needs  . Financial resource strain: Not on file  . Food insecurity:    Worry: Not on file    Inability: Not on file  . Transportation needs:    Medical: Not on file    Non-medical: Not on file  Tobacco Use  . Smoking status: Never Smoker  . Smokeless tobacco: Never Used  Substance and Sexual Activity  . Alcohol use: No  . Drug use: No  . Sexual activity: Yes  Lifestyle  . Physical activity:    Days per week: Not on file    Minutes per session: Not on file  . Stress: Not on file  Relationships  . Social connections:   Talks on phone: Not on file    Gets together: Not on file    Attends religious service: Not on file    Active member of club or organization: Not on file    Attends meetings of clubs or organizations: Not on file    Relationship status: Not on file  . Intimate partner violence:    Fear of current or ex partner: Not on file    Emotionally abused: Not on file    Physically abused: Not on file    Forced sexual activity: Not on file  Other Topics Concern  . Not on file  Social History Narrative   Lives in [place].  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     Family History  Problem Relation Age of Onset  . Asthma Mother        alive and well  . Hypertension Father        alive  . Lung cancer Father   . Cancer Father      Current Outpatient Medications on File Prior to Visit  Medication Sig Dispense Refill  . carvedilol (COREG) 25 MG tablet Take 25 mg by  mouth 2 (two) times daily with a meal.    . furosemide (LASIX) 40 MG tablet Take 80 mg by mouth 2 (two) times daily.    . hydrALAZINE (APRESOLINE) 50 MG tablet Take 50 mg by mouth 3 (three) times daily.    [name]. lisinopril (ZESTRIL) 40 MG tablet Take 40 mg by mouth daily.    . metolazone (ZAROXOLYN) 2.5 MG tablet Take 2.5 mg by mouth as needed.    . potassium chloride SA ([name]-DUR) 20 MEQ tablet Take 20 mEq by mouth 2 (two) times daily.    . Rivaroxaban (XARELTO) 15 MG TABS tablet Take 15 mg by mouth daily.     Current Facility-Administered Medications on File Prior to Visit  Medication Dose Route Frequency Provider Last Rate Last Dose  . isosorbide-hydrALAZINE (BIDIL) 20-37.5 MG per tablet 1 tablet  1 tablet Oral BID Cody [name], Cody BeneManish J, MD        Cardiovascular studies:  EKG 12/07/2018: Atrial fibrillation with controlled ventricular rate Left bundle branch block.   EKG 11/04/2018: Afib w/RVR. IVCD  LE [place] 11/02/2018: Right: There is no evidence of deep vein thrombosis in the lower extremity. No  cystic structure found in the popliteal fossa.  Echocardiogram 10/29/2018:  1. The left ventricle has severely reduced systolic function, with an ejection fraction of 20-25%. The cavity size was mildly dilated. There is moderately increased left ventricular wall thickness. Left ventricular diastolic function could not be  evaluated secondary to atrial fibrillation.  2. The right ventricle has moderately reduced systolic function. The cavity was normal. There is mildly increased right ventricular wall thickness.  3. Left atrial size was moderately dilated.  4. The aortic valve is tricuspid Aortic valve regurgitation is trivial by color flow Doppler.  5. The aortic root and ascending aorta are normal in size and structure.  6. The inferior vena cava was dilated in size with <50% respiratory variability.  7. The interatrial septum was not assessed.  Recent labs: Results for Cody [name] (MRN [id number]) as of 12/07/2018 11:20  Ref. Range 11/04/2018 08:13 11/04/2018 08:31  BASIC METABOLIC PANEL Unknown Rpt (A)   Sodium Latest Ref Range: 135 - 145 mmol/L 140   Potassium Latest Ref Range: 3.5 - 5.1 mmol/L 3.5   Chloride Latest Ref Range: 98 - 111 mmol/L 104   CO2 Latest Ref Range: 22 - 32 mmol/L 27   Glucose Latest Ref Range: 70 - 99 mg/dL 696109 (H)   BUN Latest Ref Range: 6 - 20 mg/dL 25 (H)   Creatinine Latest Ref Range: 0.61 - 1.24 mg/dL 2.951.37 (H)   Calcium Latest Ref Range: 8.9 - 10.3 mg/dL 9.0   Anion gap Latest Ref Range: 5 - 15  9   Alkaline Phosphatase Latest Ref Range: 38 - 126 U/L  85  Albumin Latest Ref Range: 3.5 - 5.0 g/dL  3.5  AST Latest Ref Range: 15 - 41 U/L  21  ALT Latest Ref Range: 0 - 44 U/L  20  Total Protein Latest Ref Range: 6.5 - 8.1 g/dL  6.7  Bilirubin, Direct Latest Ref Range: 0.0 - 0.2 mg/dL  0.3 (H)  Indirect Bilirubin Latest Ref Range: 0.3 - 0.9 mg/dL  0.8  Total Bilirubin Latest Ref Range: 0.3 - 1.2 mg/dL  1.1  GFR, Est Non African American Latest Ref Range: >60  mL/min >60   GFR, Est African American Latest Ref Range: >60 mL/min >60    Results for Cody [name] (MRN [id number]) as of 12/07/2018 11:20  Ref. Range 11/04/2018 08:13  WBC Latest Ref Range: 4.0 - 10.5 [name]/uL 10.7 (H)  RBC Latest Ref Range: 4.22 - 5.81 MIL/uL 4.62  Hemoglobin Latest Ref Range: 13.0 - 17.0 g/dL 40.9 (L)  HCT Latest Ref Range: 39.0 - 52.0 % 38.5 (L)  MCV Latest Ref Range: 80.0 - 100.0 fL 83.3  MCH Latest Ref Range: 26.0 - 34.0 pg 24.7 (L)  MCHC Latest Ref Range: 30.0 - 36.0 g/dL 81.1 (L)  RDW Latest Ref Range: 11.5 - 15.5 % 18.3 (H)  Platelets Latest Ref Range: 150 - 400 [name]/uL 386  nRBC Latest Ref Range: 0.0 - 0.2 % 0.0   Results for GRANTLAND, WANT (MRN [id number]) as of 12/07/2018 11:20  Ref. Range 02/20/2016 14:53 10/31/2016 00:15 10/31/2016 03:40 10/29/2018 03:12 10/29/2018 09:18 10/29/2018 15:03 11/04/2018 08:31  Troponin I Latest Ref Range: <0.03 ng/mL 0.20 (HH) 0.10 (HH) 0.14 (HH) 0.09 (HH) 0.07 (HH) 0.06 (HH) 0.08 (HH)    Review of Systems  Constitution: Negative for decreased appetite, malaise/fatigue, weight gain and weight loss.  HENT: Negative for congestion.   Eyes: Negative for visual disturbance.  Cardiovascular: Positive for dyspnea on exertion and leg swelling. Negative for chest pain, claudication, palpitations and syncope.  Respiratory: Positive for shortness of breath.   Endocrine: Negative for cold intolerance.  Hematologic/Lymphatic: Does not bruise/bleed easily.  Skin: Negative for itching and rash.  Musculoskeletal: Positive for joint pain (Right knee) and joint swelling (Right knee). Negative for myalgias.  Gastrointestinal: Negative for abdominal pain, nausea and vomiting.  Genitourinary: Negative for dysuria.  Neurological: Negative for dizziness and weakness.  Psychiatric/Behavioral: The patient is not nervous/anxious.   All other systems reviewed and are negative.       *** There were no vitals filed for this visit.  Objective:   Physical Exam   Constitutional: He is oriented to person, place, and time. He appears well-developed and well-nourished. No distress.  HENT:  Head: Normocephalic and atraumatic.  Eyes: Pupils are equal, round, and reactive to light. Conjunctivae are normal.  Neck: No JVD present.  Cardiovascular: Normal rate and intact distal pulses. An irregularly irregular rhythm present.  No murmur heard. Pulmonary/Chest: Effort normal and breath sounds normal. He has no wheezes. He has no rales.  Abdominal: Soft. Bowel sounds are normal. There is no rebound.  Musculoskeletal:        General: Tenderness (Right knee. Swelling noted) and edema (1-2+ b/l) present.  Lymphadenopathy:    He has no cervical adenopathy.  Neurological: He is alert and oriented to person, place, and time. No cranial nerve deficit.  Skin: Skin is warm and dry.  Psychiatric: He has a normal mood and affect.  Nursing note and vitals reviewed.         Assessment & Recommendations:   48 y.o. African American male  with hypertension, possible OSA, h/o RLE DVT (2017), persistent Afib, CKD3, now with new diagnosis of HFrEF.  *** 1. 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 Blood pressure remains uncontrolled. He has CKD 3. I do not have labs within past month. Continue lisinopril 40 mg daily, coreg 25 mg bid. Added Bidil 20-37.5 mg bid (samples given). Continue lasix.  I will obtain BMP today. He will need addition of spironolactone, as well as switching to [facility] in the future. At some point, he will need either noninasive or invasive evaluation of his coronaries.   *** 2. Elevated  troponin Seen on multiple ED admissions. I do not think this reflects type 1 MI, likely reflects type 2 MI due to heart failure.   *** 3. Persistent atrial fibrillation Continue coreg 25 mg bid. Given that he remains in Afib on  amiodarone, I have stopped amiodarone. Will persue rate control therapy for now. He will need cardioversion at some point. CHA2DS2VASc score 3. Annual stroke risk 4% He is currently on Xarelto 15 mg daily. Based on his BMP, he may need adjustment of his dose to 20 mg.  *** 4. Essential hypertension Uncontrolled. Changes made as above.   *** 5. Right knee pain: I think this is unrelated to heart failure. Suspect knee effusion ?arthritis. Needs follow up with PCP.    ***  Laquashia Mergenthaler Emiliano Dyer, MD [facility] Pager: [phone] Office: [phone] If no answer Cell [phone]

## 2018-12-14 ENCOUNTER — Emergency Department (HOSPITAL_COMMUNITY)
Admission: EM | Admit: 2018-12-14 | Discharge: 2018-12-14 | Disposition: A | Discharge status: Home / Self Care | Payer: Medicaid Other | Attending: Emergency Medicine | Admitting: Emergency Medicine

## 2018-12-14 ENCOUNTER — Encounter (HOSPITAL_COMMUNITY): Payer: Self-pay | Admitting: Student

## 2018-12-14 ENCOUNTER — Encounter: Payer: Self-pay | Admitting: Cardiology

## 2018-12-14 ENCOUNTER — Ambulatory Visit: Payer: Self-pay | Admitting: Cardiology

## 2018-12-14 ENCOUNTER — Other Ambulatory Visit: Payer: Self-pay

## 2018-12-14 ENCOUNTER — Ambulatory Visit (INDEPENDENT_AMBULATORY_CARE_PROVIDER_SITE_OTHER): Payer: Medicaid Other | Admitting: Cardiology

## 2018-12-14 VITALS — BP 131/96 | HR 88 | Temp 99.2°F | Ht 72.0 in | Wt 239.0 lb

## 2018-12-14 DIAGNOSIS — Z86718 Personal history of other venous thrombosis and embolism: Secondary | ICD-10-CM | POA: Insufficient documentation

## 2018-12-14 DIAGNOSIS — R609 Edema, unspecified: Secondary | ICD-10-CM | POA: Diagnosis not present

## 2018-12-14 DIAGNOSIS — I4891 Unspecified atrial fibrillation: Secondary | ICD-10-CM | POA: Insufficient documentation

## 2018-12-14 DIAGNOSIS — I5022 Chronic systolic (congestive) heart failure: Secondary | ICD-10-CM

## 2018-12-14 DIAGNOSIS — N183 Chronic kidney disease, stage 3 (moderate): Secondary | ICD-10-CM | POA: Insufficient documentation

## 2018-12-14 DIAGNOSIS — Z79899 Other long term (current) drug therapy: Secondary | ICD-10-CM | POA: Insufficient documentation

## 2018-12-14 DIAGNOSIS — I509 Heart failure, unspecified: Secondary | ICD-10-CM | POA: Diagnosis not present

## 2018-12-14 DIAGNOSIS — I130 Hypertensive heart and chronic kidney disease with heart failure and stage 1 through stage 4 chronic kidney disease, or unspecified chronic kidney disease: Secondary | ICD-10-CM | POA: Insufficient documentation

## 2018-12-14 DIAGNOSIS — M25561 Pain in right knee: Secondary | ICD-10-CM

## 2018-12-14 DIAGNOSIS — M109 Gout, unspecified: Secondary | ICD-10-CM

## 2018-12-14 DIAGNOSIS — I4819 Other persistent atrial fibrillation: Secondary | ICD-10-CM

## 2018-12-14 LAB — BASIC METABOLIC PANEL
Anion gap: 13 (ref 5–15)
BUN: 14 mg/dL (ref 6–20)
CO2: 27 mmol/L (ref 22–32)
Calcium: 9.5 mg/dL (ref 8.9–10.3)
Chloride: 100 mmol/L (ref 98–111)
Creatinine, Ser: 1.54 mg/dL — ABNORMAL HIGH (ref 0.61–1.24)
GFR calc Af Amer: >60 mL/min (ref >60)
GFR calc non Af Amer: 53 mL/min — ABNORMAL LOW (ref >60)
Glucose, Bld: 96 mg/dL (ref 70–99)
Potassium: 3.4 mmol/L — ABNORMAL LOW (ref 3.5–5.1)
Sodium: 140 mmol/L (ref 135–145)

## 2018-12-14 LAB — GRAM STAIN

## 2018-12-14 LAB — CBC WITH DIFFERENTIAL/PLATELET
Abs Immature Granulocytes: 0 10*3/uL (ref 0.00–0.07)
Basophils Absolute: 0.2 10*3/uL — ABNORMAL HIGH (ref 0.0–0.1)
Basophils Relative: 2 %
Eosinophils Absolute: 0 10*3/uL (ref 0.0–0.5)
Eosinophils Relative: 0 %
HCT: 32.1 % — ABNORMAL LOW (ref 39.0–52.0)
Hemoglobin: 9.9 g/dL — ABNORMAL LOW (ref 13.0–17.0)
Lymphocytes Relative: 10 %
Lymphs Abs: 0.8 10*3/uL (ref 0.7–4.0)
MCH: 25.1 pg — ABNORMAL LOW (ref 26.0–34.0)
MCHC: 30.8 g/dL (ref 30.0–36.0)
MCV: 81.3 fL (ref 80.0–100.0)
Monocytes Absolute: 0.5 10*3/uL (ref 0.1–1.0)
Monocytes Relative: 6 %
Neutro Abs: 6.4 10*3/uL (ref 1.7–7.7)
Neutrophils Relative %: 82 %
Platelets: 303 10*3/uL (ref 150–400)
RBC: 3.95 MIL/uL — ABNORMAL LOW (ref 4.22–5.81)
RDW: 16.8 % — ABNORMAL HIGH (ref 11.5–15.5)
WBC: 7.8 10*3/uL (ref 4.0–10.5)
nRBC: 0 % (ref 0.0–0.2)
nRBC: 0 /100 WBC

## 2018-12-14 LAB — SYNOVIAL CELL COUNT + DIFF, W/ CRYSTALS
Eosinophils-Synovial: 0 % (ref 0–1)
Lymphocytes-Synovial Fld: 2 % (ref 0–20)
Monocyte-Macrophage-Synovial Fluid: 4 % — ABNORMAL LOW (ref 50–90)
Neutrophil, Synovial: 94 % — ABNORMAL HIGH (ref 0–25)
Other Cells-SYN: 0
WBC, Synovial: 37000 /mm3 — ABNORMAL HIGH (ref 0–200)

## 2018-12-14 MED ORDER — MORPHINE SULFATE (PF) 4 MG/ML IV SOLN
4.0000 mg | Freq: Once | INTRAVENOUS | Status: AC
  Administered 2018-12-14: 4 mg via INTRAVENOUS
  Filled 2018-12-14: qty 1

## 2018-12-14 MED ORDER — OXYCODONE-ACETAMINOPHEN 5-325 MG PO TABS: 1.0000 | ORAL_TABLET | Freq: Four times a day (QID) | ORAL | 0 refills | Status: DC | PRN

## 2018-12-14 MED ORDER — SACUBITRIL-VALSARTAN 49-51 MG PO TABS: 1.0000 | ORAL_TABLET | Freq: Two times a day (BID) | ORAL | Status: DC

## 2018-12-14 MED ORDER — PREDNISONE 20 MG PO TABS
60.0000 mg | ORAL_TABLET | Freq: Once | ORAL | Status: AC
  Administered 2018-12-14: 60 mg via ORAL
  Filled 2018-12-14: qty 3

## 2018-12-14 MED ORDER — ONDANSETRON HCL 4 MG/2ML IJ SOLN
4.0000 mg | Freq: Once | INTRAMUSCULAR | Status: AC
  Administered 2018-12-14: 17:00:00 4 mg via INTRAVENOUS
  Filled 2018-12-14: qty 2

## 2018-12-14 MED ORDER — PREDNISONE 10 MG (21) PO TBPK: ORAL_TABLET | Freq: Every day | ORAL | 0 refills | Status: DC

## 2018-12-14 MED ORDER — LIDOCAINE-EPINEPHRINE 2 %-1:100000 IJ SOLN
20.0000 mL | Freq: Once | INTRAMUSCULAR | Status: AC
  Administered 2018-12-14: 17:00:00 20 mL
  Filled 2018-12-14: qty 20

## 2018-12-14 MED ORDER — FUROSEMIDE 40 MG PO TABS: 40.0000 mg | ORAL_TABLET | Freq: Every day | ORAL | 2 refills | Status: DC

## 2018-12-14 MED ORDER — APIXABAN 5 MG PO TABS: 5.0000 mg | ORAL_TABLET | Freq: Two times a day (BID) | ORAL | 2 refills | Status: DC

## 2018-12-14 MED ORDER — ISOSORB DINITRATE-HYDRALAZINE 20-37.5 MG PO TABS: 1.0000 | ORAL_TABLET | Freq: Three times a day (TID) | ORAL | 2 refills | Status: DC

## 2018-12-14 MED ORDER — ACETAMINOPHEN 325 MG PO TABS
650.0000 mg | ORAL_TABLET | Freq: Once | ORAL | Status: AC
  Administered 2018-12-14: 17:00:00 650 mg via ORAL
  Filled 2018-12-14: qty 2

## 2018-12-14 MED ORDER — OXYCODONE-ACETAMINOPHEN 5-325 MG PO TABS
1.0000 | ORAL_TABLET | Freq: Once | ORAL | Status: AC
  Administered 2018-12-14: 21:00:00 1 via ORAL
  Filled 2018-12-14: qty 1

## 2018-12-14 NOTE — Progress Notes (Signed)
CSW received a call from [facility] RN CM stating pt needs transport and the taxi's are not responding.  CSW called (see below) pt's significant other who stated she would call the pt and tell him to "[facility]".  McManus,Christia Significant other   [phone]   TOC RN CM updated.  CSW will continue to follow for D/C needs.  Dorothe Pea. Beckett Hickmon, LCSW, LCAS, CSI Transitions of Care Clinical Social Worker Care Coordination Department Ph: [phone]

## 2018-12-14 NOTE — ED Triage Notes (Signed)
Pt reports coming here recently to get fluid removed from his right knee. Pt went to PCP today and was sent here for evaluation. Pt reports increased swelling, pain and unable to put weight on it.

## 2018-12-14 NOTE — Progress Notes (Signed)
Follow up visit  Subjective:   Cody [name], male    DOB: December 12, 1970, 48 y.o.   MRN: [id number]   Chief complaint: Cardiomyopathy  48 y.o. African American male  with hypertension, possible OSA, h/o RLE DVT (2017), persistent Afib, CKD3, now with new diagnosis of HFrEF.  At last visit on 12/07/2018, I continued his lisinopril 40 mg daily, coreg 25 mg bid, lasix, and added Bidil 20-37.5 mg bid (samples given), with plans to add spironolactone, as well as switching to [place] in the future. At some point, he will need either noninasive or invasive evaluation of his coronaries.   I ordered BMP at last visit, but he did not have this performed.  Nonetheless, he has been compliant with his heart failure medical therapy with significant improvements in his blood pressure and breathing.  However, his biggest complaint at this time is his right knee swelling.  Patient was in the ER on 0 424 and underwent arthrocentesis.  However, he is continued to have massive swelling in the right knee limiting his mobility.  Unfortunately, he has not been able to see his primary care provider for further work-up.  He is extremely concerned about the knee swelling.  Past Medical History:  Diagnosis Date  . Atrial fibrillation (HCC)   . CKD (chronic kidney disease), stage III (HCC)    Cody [name] 02/20/2016  . DVT (deep venous thrombosis) (HCC) 01/2016   a. RLE - Dx 01/2016 in [name] - Took Xarelto for 30 days - out x 2 wks.  [name]   . Hypertension   . Hypertensive heart disease with CHF (congestive heart failure) (HCC)      Past Surgical History:  Procedure Laterality Date  . NO PAST SURGERIES       Social History   Socioeconomic History  . Marital status: Legally Separated    Spouse name: Not on file  . Number of children: 1  . Years of education: Not on file  . Highest education level: Not on file  Occupational History  . Not on file  Social Needs  . Financial resource strain: Not on file   . Food insecurity:    Worry: Not on file    Inability: Not on file  . Transportation needs:    Medical: Not on file    Non-medical: Not on file  Tobacco Use  . Smoking status: Never Smoker  . Smokeless tobacco: Never Used  Substance and Sexual Activity  . Alcohol use: No  . Drug use: No  . Sexual activity: Yes  Lifestyle  . Physical activity:    Days per week: Not on file    Minutes per session: Not on file  . Stress: Not on file  Relationships  . Social connections:    Talks on phone: Not on file    Gets together: Not on file    Attends religious service: Not on file    Active member of club or organization: Not on file    Attends meetings of clubs or organizations: Not on file    Relationship status: Not on file  . Intimate partner violence:    Fear of current or ex partner: Not on file    Emotionally abused: Not on file    Physically abused: Not on file    Forced sexual activity: Not on file  Other Topics Concern  . Not on file  Social History Narrative   Lives in [place].  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     Family History  Problem Relation Age of Onset  . Asthma Mother        alive and well  . Hypertension Father        alive  . Lung cancer Father   . Cancer Father      Current Outpatient Medications on File Prior to Visit  Medication Sig Dispense Refill  . carvedilol (COREG) 25 MG tablet Take 25 mg by mouth 2 (two) times daily with a meal.    . furosemide (LASIX) 40 MG tablet Take 80 mg by mouth 2 (two) times daily.    . hydrALAZINE (APRESOLINE) 50 MG tablet Take 50 mg by mouth 3 (three) times daily.    [name] lisinopril (ZESTRIL) 40 MG tablet Take 40 mg by mouth daily.    . metolazone (ZAROXOLYN) 2.5 MG tablet Take 2.5 mg by mouth as needed.    . potassium chloride SA (K-DUR) 20 MEQ tablet Take 20 mEq by mouth 2 (two) times daily.    . Rivaroxaban (XARELTO) 15 MG TABS tablet Take 15 mg by mouth daily.     Current  Facility-Administered Medications on File Prior to Visit  Medication Dose Route Frequency Provider Last Rate Last Dose  . isosorbide-hydrALAZINE (BIDIL) 20-37.5 MG per tablet 1 tablet  1 tablet Oral BID , Anabel Bene, MD        Cardiovascular studies:  EKG 12/07/2018: Atrial fibrillation with controlled ventricular rate Left bundle branch block.   EKG 11/04/2018: Afib w/RVR. IVCD  LE [place] 11/02/2018: Right: There is no evidence of deep vein thrombosis in the lower extremity. No cystic structure found in the popliteal fossa.  Echocardiogram 10/29/2018:  1. The left ventricle has severely reduced systolic function, with an ejection fraction of 20-25%. The cavity size was mildly dilated. There is moderately increased left ventricular wall thickness. Left ventricular diastolic function could not be  evaluated secondary to atrial fibrillation.  2. The right ventricle has moderately reduced systolic function. The cavity was normal. There is mildly increased right ventricular wall thickness.  3. Left atrial size was moderately dilated.  4. The aortic valve is tricuspid Aortic valve regurgitation is trivial by color flow Doppler.  5. The aortic root and ascending aorta are normal in size and structure.  6. The inferior vena cava was dilated in size with <50% respiratory variability.  7. The interatrial septum was not assessed.  Recent labs: Results for PSALMS, GARRINGER (MRN [id number]) as of 12/07/2018 11:20  Ref. Range 11/04/2018 08:13 11/04/2018 08:31  BASIC METABOLIC PANEL Unknown Rpt (A)   Sodium Latest Ref Range: 135 - 145 mmol/L 140   Potassium Latest Ref Range: 3.5 - 5.1 mmol/L 3.5   Chloride Latest Ref Range: 98 - 111 mmol/L 104   CO2 Latest Ref Range: 22 - 32 mmol/L 27   Glucose Latest Ref Range: 70 - 99 mg/dL 932 (H)   BUN Latest Ref Range: 6 - 20 mg/dL 25 (H)   Creatinine Latest Ref Range: 0.61 - 1.24 mg/dL 6.71 (H)   Calcium Latest Ref Range: 8.9 - 10.3 mg/dL 9.0   Anion gap  Latest Ref Range: 5 - 15  9   Alkaline Phosphatase Latest Ref Range: 38 - 126 U/L  85  Albumin Latest Ref Range: 3.5 - 5.0 g/dL  3.5  AST Latest Ref Range: 15 - 41 U/L  21  ALT Latest Ref Range: 0 - 44 U/L  20  Total Protein Latest  Ref Range: 6.5 - 8.1 g/dL  6.7  Bilirubin, Direct Latest Ref Range: 0.0 - 0.2 mg/dL  0.3 (H)  Indirect Bilirubin Latest Ref Range: 0.3 - 0.9 mg/dL  0.8  Total Bilirubin Latest Ref Range: 0.3 - 1.2 mg/dL  1.1  GFR, Est Non African American Latest Ref Range: >60 mL/min >60   GFR, Est African American Latest Ref Range: >60 mL/min >60    Results for CASTO, MIKEL (MRN [id number]) as of 12/07/2018 11:20  Ref. Range 11/04/2018 08:13  WBC Latest Ref Range: 4.0 - 10.5 K/uL 10.7 (H)  RBC Latest Ref Range: 4.22 - 5.81 MIL/uL 4.62  Hemoglobin Latest Ref Range: 13.0 - 17.0 g/dL 91.5 (L)  HCT Latest Ref Range: 39.0 - 52.0 % 38.5 (L)  MCV Latest Ref Range: 80.0 - 100.0 fL 83.3  MCH Latest Ref Range: 26.0 - 34.0 pg 24.7 (L)  MCHC Latest Ref Range: 30.0 - 36.0 g/dL 05.6 (L)  RDW Latest Ref Range: 11.5 - 15.5 % 18.3 (H)  Platelets Latest Ref Range: 150 - 400 K/uL 386  nRBC Latest Ref Range: 0.0 - 0.2 % 0.0   Results for EKANSH, DITTER (MRN [id number]) as of 12/07/2018 11:20  Ref. Range 02/20/2016 14:53 10/31/2016 00:15 10/31/2016 03:40 10/29/2018 03:12 10/29/2018 09:18 10/29/2018 15:03 11/04/2018 08:31  Troponin I Latest Ref Range: <0.03 ng/mL 0.20 (HH) 0.10 (HH) 0.14 (HH) 0.09 (HH) 0.07 (HH) 0.06 (HH) 0.08 (HH)    Review of Systems  Constitution: Negative for decreased appetite, malaise/fatigue, weight gain and weight loss.  HENT: Negative for congestion.   Eyes: Negative for visual disturbance.  Cardiovascular: Positive for dyspnea on exertion (Improved.  Still has class II symptoms) and leg swelling (Improved on the left side.  Significant swelling in right knee.). Negative for chest pain, claudication, palpitations and syncope.  Respiratory: Positive for shortness of breath  (Improved).   Endocrine: Negative for cold intolerance.  Hematologic/Lymphatic: Does not bruise/bleed easily.  Skin: Negative for itching and rash.  Musculoskeletal: Positive for joint pain (Right knee) and joint swelling (Right knee). Negative for myalgias.  Gastrointestinal: Negative for abdominal pain, nausea and vomiting.  Genitourinary: Negative for dysuria.  Neurological: Negative for dizziness and weakness.  Psychiatric/Behavioral: The patient is not nervous/anxious.   All other systems reviewed and are negative.        Vitals:   12/14/18 1256  BP: (!) 131/96  Pulse: 88  Temp: 99.2 F (37.3 C)  SpO2: 97%    Objective:   Physical Exam  Constitutional: He is oriented to person, place, and time. He appears well-developed and well-nourished. No distress.  HENT:  Head: Normocephalic and atraumatic.  Eyes: Pupils are equal, round, and reactive to light. Conjunctivae are normal.  Neck: No JVD present.  Cardiovascular: Normal rate and intact distal pulses. An irregularly irregular rhythm present.  No murmur heard. Pulmonary/Chest: Effort normal and breath sounds normal. He has no wheezes. He has no rales.  Abdominal: Soft. Bowel sounds are normal. There is no rebound.  Musculoskeletal:        General: Tenderness (Right knee. ) and edema (Trace edema left leg.  1+ edema right leg.  Massive right knee swelling.) present.  Lymphadenopathy:    He has no cervical adenopathy.  Neurological: He is alert and oriented to person, place, and time. No cranial nerve deficit.  Skin: Skin is warm and dry.  Psychiatric: He has a normal mood and affect.  Nursing note and vitals reviewed.  Assessment & Recommendations:   48 y.o. African American male  with hypertension, possible OSA, h/o RLE DVT (2017), persistent Afib, CKD3, now with new diagnosis of HFrEF.  1. 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  Overall, improvement in his heart failure symptoms.  Still has NYHA class II symptoms.   Blood pressure significantly improved. Continue carvedilol 25 mg twice daily. To further optimize his heart failure therapy, I have stopped his lisinopril 40 mg today.  I have given him samples of Entresto 49-51 mg twice daily, to be started on April 30. I have increased his BiDil to 20-37.5 mg 1 tablet 3 times a day to continue blood pressure control. Repeat blood work on May 7.  I will see him for virtual visit follow-up on the 11th. In future, I plan to add spironolactone.  When compensated from heart failure standpoint, he will need ischemia evaluation.  2. Elevated troponin Seen on multiple ED admissions. I do not think this reflects type 1 MI, likely reflects type 2 MI due to heart failure.   3. Persistent atrial fibrillation Continue coreg 25 mg bid.  CHA2DS2VASc score 3. Annual stroke risk 4% He has been out of Xarelto. Gave samples and prescription for Eliquis  Mg bid.   4. Essential hypertension Better controlled.  5. Right knee pain: This remains patients biggest complaint at this time. He will be going to the urgent care/ED given acuity of symptoms.   Elder NegusManish J , MD [facility] Pager: [phone] Office: [phone] If no answer Cell [phone]

## 2018-12-14 NOTE — Discharge Instructions (Signed)
You were seen in the emergency department today for knee pain.  The fluid analyzed from your joint tap showed findings consistent with gout.  We suspect this is a cause of your pain.  Please see the attached handout regarding this diagnosis.  We are treating this with prednisone, a steroid, please take this as prescribed.  We are also treating this with Percocet For severe pain.  -Percocet-this is a narcotic/controlled substance medication that has potential addicting qualities.  We recommend that you take 1-2 tablets every 6 hours as needed for severe pain.  Do not drive or operate heavy machinery when taking this medicine as it can be sedating. Do not drink alcohol or take other sedating medications when taking this medicine for safety reasons.  Keep this out of reach of small children.  Please be aware this medicine has Tylenol in it (325 mg/tab) do not exceed the maximum dose of Tylenol in a day per over the counter recommendations should you decide to supplement with Tylenol over the counter.    We have prescribed you new medication(s) today. Discuss the medications prescribed today with your pharmacist as they can have adverse effects and interactions with your other medicines including over the counter and prescribed medications. Seek medical evaluation if you start to experience new or abnormal symptoms after taking one of these medicines, seek care immediately if you start to experience difficulty breathing, feeling of your throat closing, facial swelling, or rash as these could be indications of a more serious allergic reaction  We would like you to follow-up closely with Dr. Dion Saucier, an orthopedic surgeon, for reevaluation.  His information is in your discharge instructions.  Please call for an appointment for tomorrow or Friday.  Your blood pressure was elevated in the ER and your hemoglobin was a bit low, please follow-up with primary care to have these rechecked.  Return to the ER for new or  worsening symptoms including but not limited to worsening pain, inability to walk, numbness, weakness, or any other concerns.

## 2018-12-14 NOTE — ED Provider Notes (Signed)
[facility] EMERGENCY DEPARTMENT Provider Note   CSN: [id number] Arrival date & time: 12/14/18  1451    History   Chief Complaint Chief Complaint  Patient presents with   Knee Pain    HPI Cody [name] is a 48 y.o. male with a hx of HTN, chronic systolic CHF, CKD, OSA, afib, prior DVT prescribed Xarelto, and chronic R knee pain who returns to the ER for worsened R knee pain/swelling. Patient notes several years of intermittent R knee pain/swelling, notes over past few months this has seemed more persistent waxing/waning in severity. States he was seen in the ER 4 days prior, had arthrocentesis procedure, and has since had return of swelling, worsened pain, and is unable to bear weight. Pain is a 10/10, worse with movement or attempts to put weight on the RLE. No alleviating factors. Denies fever, chills, numbness, or weakness. Hx of prior DVT, had run out of his xarelto, saw his cardiologist today who has provided him w/ eliquis samples to start taking. He was sent to ER from cardiologist office for further assessment of his knee pain. Denies recent travel/surgery/trauma, cough, chest pain, dyspnea, or abdominal pain.       HPI  Past Medical History:  Diagnosis Date   Atrial fibrillation (HCC)    CKD (chronic kidney disease), stage III (HCC)    [name] 02/20/2016   DVT (deep venous thrombosis) (HCC) 01/2016   a. RLE - Dx 01/2016 in [name] - Took Xarelto for 30 days - out x 2 wks.   Dyspnea    Hypertension    Hypertensive heart disease with CHF (congestive heart failure) [facility])     Patient Active Problem List   Diagnosis Date Noted   Acute pain of right knee 12/08/2018   Atrial fibrillation with RVR (HCC) 10/29/2018   Hypertensive urgency 10/29/2018   A-fib (HCC) 10/29/2018   Chronic atrial fibrillation 08/02/2018   History of DVT (deep vein thrombosis) 07/27/2018   Anemia due to stage 3 chronic kidney disease (HCC) 07/27/2018   Heme positive  stool 07/27/2018   Essential hypertension 07/19/2018   Chronic systolic heart failure (HCC) 07/17/2018   Obstructive sleep apnea 12/11/2016   H/O noncompliance with medical treatment, presenting hazards to health    Elevated troponin 02/20/2016   CKD (chronic kidney disease) stage 3, GFR 30-59 ml/min (HCC) 02/20/2016    Past Surgical History:  Procedure Laterality Date   NO PAST SURGERIES          Home Medications    Prior to Admission medications   Medication Sig Start Date End Date Taking? Authorizing Provider  apixaban (ELIQUIS) 5 MG TABS tablet Take 1 tablet (5 mg total) by mouth 2 (two) times daily. 12/14/18   Patwardhan, Anabel Bene, MD  carvedilol (COREG) 25 MG tablet Take 25 mg by mouth 2 (two) times daily with a meal.    [provider]  furosemide (LASIX) 40 MG tablet Take 1 tablet (40 mg total) by mouth daily. 12/14/18   Patwardhan, Manish J, MD  isosorbide-hydrALAZINE (BIDIL) 20-37.5 MG tablet Take 1 tablet by mouth 3 (three) times daily. 12/14/18   Patwardhan, Anabel Bene, MD  metolazone (ZAROXOLYN) 2.5 MG tablet Take 2.5 mg by mouth as needed.    [provider]  potassium chloride SA (K-DUR) 20 MEQ tablet Take 20 mEq by mouth 2 (two) times daily.    [provider]    Family History Family History  Problem Relation Age of Onset  Asthma Mother        alive and well   Hypertension Father        alive   Lung cancer Father    Cancer Father     Social History Social History   Tobacco Use   Smoking status: Never Smoker   Smokeless tobacco: Never Used  Substance Use Topics   Alcohol use: No   Drug use: No     Allergies   Patient has no known allergies.   Review of Systems Review of Systems  Constitutional: Negative for chills and fever.  Respiratory: Negative for shortness of breath.   Cardiovascular: Negative for chest pain.  Gastrointestinal: Negative for vomiting.  Musculoskeletal: Positive for arthralgias and  joint swelling.  Neurological: Negative for weakness and numbness.  All other systems reviewed and are negative.    Physical Exam Updated Vital Signs BP (!) 142/102    Pulse 75    Temp (!) 100.8 F (38.2 C) (Oral)    Resp 17    Ht 6' (1.829 m)    Wt 108.4 kg    SpO2 98%    BMI 32.41 kg/m   Physical Exam Vitals signs and nursing note reviewed.  Constitutional:      General: He is not in acute distress.    Appearance: He is well-developed. He is not ill-appearing or toxic-appearing.  HENT:     Head: Normocephalic and atraumatic.  Eyes:     General:        Right eye: No discharge.        Left eye: No discharge.     Conjunctiva/sclera: Conjunctivae normal.  Neck:     Musculoskeletal: Neck supple.  Cardiovascular:     Rate and Rhythm: Normal rate.     Pulses:          Dorsalis pedis pulses are 2+ on the right side and 2+ on the left side.       Posterior tibial pulses are 2+ on the right side and 2+ on the left side.  Pulmonary:     Effort: Pulmonary effort is normal. No respiratory distress.     Breath sounds: Normal breath sounds. No wheezing, rhonchi or rales.  Abdominal:     General: There is no distension.     Palpations: Abdomen is soft.     Tenderness: There is no abdominal tenderness.  Musculoskeletal:     Comments: Lower extremities: Patient has edema noted to bilateral lower legs, R mildly > L. He has an effusion noted to the R knee. No significant erythema or temperature discretion between the LEs. Intact AROM throughout the LLE. Intact AROM to the R ankle, R knee AROM significantly limited, held in extension, able to flex minimally actively, able to flex to about 90 degrees w/ passive range of motion- limited secondary to pain. Difficult AROM to the R hip as it hurts the R knee. Tender to diffuse R knee extends to proximal 1/3rd of the anterior lower leg. Otherwise nontender. No calf tenderness.   Skin:    General: Skin is warm and dry.     Capillary Refill: Capillary  refill takes less than 2 seconds.     Findings: No rash.  Neurological:     Mental Status: He is alert.     Comments: Alert. Clear speech. Sensation grossly intact to bilateral lower extremities. 5/5 strength with plantar/dorsiflexion bilaterally.  Psychiatric:        Mood and Affect: Mood normal.  Behavior: Behavior normal.    ED Treatments / Results  Labs (all labs ordered are listed, but only abnormal results are displayed) Labs Reviewed - No data to display  EKG None  Radiology No results found.  Procedures .Joint Aspiration/Arthrocentesis Date/Time: 12/14/2018 5:42 PM Performed by: Cherly Anderson, PA-C Authorized by: Cherly Anderson, PA-C   Consent:    Consent obtained:  Verbal   Consent given by:  Patient   Risks discussed:  Bleeding, incomplete drainage, nerve damage, infection, pain and poor cosmetic result   Alternatives discussed:  No treatment Location:    Location:  Knee   Knee:  R knee Anesthesia (see MAR for exact dosages):    Anesthesia method:  Local infiltration   Local anesthetic:  Lidocaine 2% WITH epi Procedure details:    Preparation: Patient was prepped and draped in usual sterile fashion     Needle gauge:  18 G   Ultrasound guidance: no     Approach:  Lateral   Aspirate amount:  45 ccs   Aspirate characteristics:  Blood-tinged and yellow   Steroid injected: no     Specimen collected: yes   Post-procedure details:    Dressing:  Adhesive bandage   Patient tolerance of procedure:  Tolerated well, no immediate complications Comments:     Procedure performed w/ supervising physician Dr. Lynelle Doctor   (including critical care time)  SPLINT APPLICATION Date/Time: 9:28 PM Authorized by: Harvie Heck Consent: Verbal consent obtained. Risks and benefits: risks, benefits and alternatives were discussed Consent given by: patient Splint applied by: ED technician Location details: RLE Splint type: Knee immobilizer w/ ace  wrap Post-procedure: The splinted body part was neurovascularly unchanged following the procedure. Patient tolerance: Patient tolerated the procedure well with no immediate complications.  Patient has crutches he is able to use.    Medications Ordered in ED Medications  lidocaine-EPINEPHrine (XYLOCAINE W/EPI) 2 %-1:100000 (with pres) injection 20 mL (20 mLs Other Given 12/14/18 1647)  morphine 4 MG/ML injection 4 mg (4 mg Intravenous Given 12/14/18 1649)  ondansetron (ZOFRAN) injection 4 mg (4 mg Intravenous Given 12/14/18 1648)  acetaminophen (TYLENOL) tablet 650 mg (650 mg Oral Given 12/14/18 1639)  oxyCODONE-acetaminophen (PERCOCET/ROXICET) 5-325 MG per tablet 1 tablet (1 tablet Oral Given 12/14/18 2058)  predniSONE (DELTASONE) tablet 60 mg (60 mg Oral Given 12/14/18 2058)       Prior results:  LE [place] 11/02/2018: Right: There is no evidence of deep vein thrombosis in the lower extremity. No cystic structure found in the popliteal fossa.  Initial Impression / Assessment and Plan / ED Course  I have reviewed the triage vital signs and the nursing notes.  Pertinent labs & imaging results that were available during my care of the patient were reviewed by me and considered in my medical decision making (see chart for details).   Patient presents to the ED w/ acute on chronic R knee pain/swelling. Nontoxic. Noted to be febrile to 100.8 on arrival, patient unaware of fever prior to vital signs taken in the ED. BP elevated, low suspicion for HTN emergency. On exam bilateral lower legs noted to have edema R slightly > L. R knee w/ effusion, limited AROM, and diffuse tenderness. NVI distally. Had recent arthrocentesis with no fluid analysis/culture performed. Recent xray reviewed, no subsequent trauma, doubt fx/dislocation, do not feel this needs repeat. DDX: OA, Septic joint, Gout. DVT seems less likely with localization to the knee as primary area of pain w/ large effusion.   Labs:  CBC: no  leukocytosis. Mild anemia- PCP follow up.  BMP: creatinine consistent w/ prior. Mild hypokalemia.   Synovial fluid w/ 37,000 WBCs, 95% neutrophils, WBCs on gram stain- no organisms, monosodium urate crystals noted.  Seems most consistent w/ gout, but will discuss w/ orthopedics.   19:40: CONSULT: Discussed case with orthopedic surgeon Dr. Dion Saucier- states consistent w/ gout, treat with steroids, could also consider colchicine based on patient's medical comorbidities, can see in clinic Wednesday or Friday this week.   Will discharge home with oral prednisone as well as short course of percocet for severe pain. [place] Controlled Substance reporting System queried. Orthopedics follow up. I discussed results, treatment plan, need for follow-up, and return precautions with the patient. Provided opportunity for questions, patient confirmed understanding and is in agreement with plan.    Findings and plan of care discussed with supervising physician Dr. Lynelle Doctor who has evaluated patient and is in agreement.   Final Clinical Impressions(s) / ED Diagnoses   Final diagnoses:  Acute gout of right knee, unspecified cause    ED Discharge Orders         Ordered    predniSONE (STERAPRED UNI-PAK 21 TAB) 10 MG (21) TBPK tablet  Daily     12/14/18 2021    oxyCODONE-acetaminophen (PERCOCET/ROXICET) 5-325 MG tablet  Every 6 hours PRN     12/14/18 2021           Cherly Anderson, PA-C 12/14/18 2219    Linwood Dibbles, MD 12/20/18 [id number]

## 2018-12-15 LAB — URIC ACID, BODY FLUID: Uric Acid Body Fluid: 8.4 mg/dL

## 2018-12-15 LAB — GLUCOSE, BODY FLUID OTHER: Glucose, Body Fluid Other: 30 mg/dL

## 2018-12-15 LAB — PROTEIN, BODY FLUID (OTHER): Total Protein, Body Fluid Other: 5.1 g/dL

## 2018-12-19 LAB — CULTURE, BLOOD (ROUTINE X 2)
Culture: NO GROWTH
Culture: NO GROWTH
Special Requests: ADEQUATE

## 2018-12-19 LAB — CULTURE, BODY FLUID W GRAM STAIN -BOTTLE: Culture: NO GROWTH

## 2018-12-23 ENCOUNTER — Telehealth: Payer: Self-pay

## 2018-12-26 NOTE — Progress Notes (Deleted)
Follow up visit  Subjective:   Cody [name], male    DOB: [birth year], 48 y.o.   MRN: [id number]   Chief complaint: Cardiomyopathy  48 y.o. African American male  with hypertension, possible OSA, h/o RLE DVT (2017), persistent Afib, CKD3, now with new diagnosis of HFrEF.  At last visit two weeks ago, I continued carvedilol 25 mg bid, stopped lisinopril with instructions to start Entresto 49-51 mg bid two days later, increased Bidil 20-37.5 mg tid.  ***  Past Medical History:  Diagnosis Date  . Atrial fibrillation (HCC)   . CKD (chronic kidney disease), stage III (HCC)    [name] 02/20/2016  . DVT (deep venous thrombosis) (HCC) 01/2016   a. RLE - Dx 01/2016 in [name] - Took Xarelto for 30 days - out x 2 wks.  [name]   . Hypertension   . Hypertensive heart disease with CHF (congestive heart failure) (HCC)      Past Surgical History:  Procedure Laterality Date  . NO PAST SURGERIES       Social History   Socioeconomic History  . Marital status: Legally Separated    Spouse name: Not on file  . Number of children: 1  . Years of education: Not on file  . Highest education level: Not on file  Occupational History  . Not on file  Social Needs  . Financial resource strain: Not on file  . Food insecurity:    Worry: Not on file    Inability: Not on file  . Transportation needs:    Medical: Not on file    Non-medical: Not on file  Tobacco Use  . Smoking status: Never Smoker  . Smokeless tobacco: Never Used  Substance and Sexual Activity  . Alcohol use: No  . Drug use: No  . Sexual activity: Yes  Lifestyle  . Physical activity:    Days per week: Not on file    Minutes per session: Not on file  . Stress: Not on file  Relationships  . Social connections:    Talks on phone: Not on file    Gets together: Not on file    Attends religious service: Not on file    Active member of club or organization: Not on file    Attends meetings of clubs or organizations: Not  on file    Relationship status: Not on file  . Intimate partner violence:    Fear of current or ex partner: Not on file    Emotionally abused: Not on file    Physically abused: Not on file    Forced sexual activity: Not on file  Other Topics Concern  . Not on file  Social History Narrative   Lives in [place].  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     Family History  Problem Relation Age of Onset  . Asthma Mother        alive and well  . Hypertension Father        alive  . Lung cancer Father   . Cancer Father      Current Outpatient Medications on File Prior to Visit  Medication Sig Dispense Refill  . apixaban (ELIQUIS) 5 MG TABS tablet Take 1 tablet (5 mg total) by mouth 2 (two) times daily. 60 tablet 2  . carvedilol (COREG) 25 MG tablet Take 25 mg by mouth 2 (two) times daily with a meal.    . furosemide (LASIX) 40 MG tablet  Take 1 tablet (40 mg total) by mouth daily. 60 tablet 2  . isosorbide-hydrALAZINE (BIDIL) 20-37.5 MG tablet Take 1 tablet by mouth 3 (three) times daily. 90 tablet 2  . oxyCODONE-acetaminophen (PERCOCET/ROXICET) 5-325 MG tablet Take 1-2 tablets by mouth every 6 (six) hours as needed for severe pain. 8 tablet 0  . potassium chloride SA (K-DUR) 20 MEQ tablet Take 20 mEq by mouth 2 (two) times daily.    . predniSONE (STERAPRED UNI-PAK 21 TAB) 10 MG (21) TBPK tablet Take by mouth daily. Take 6 tabs by mouth daily  for 2 days, then 5 tabs for 2 days, then 4 tabs for 2 days, then 3 tabs for 2 days, 2 tabs for 2 days, then 1 tab by mouth daily for 2 days 42 tablet 0   Current Facility-Administered Medications on File Prior to Visit  Medication Dose Route Frequency Provider Last Rate Last Dose  . isosorbide-hydrALAZINE (BIDIL) 20-37.5 MG per tablet 1 tablet  1 tablet Oral BID Djimon Lundstrom J, MD      . sacubitril-valsartan (ENTRESTO) 49-51 mg per tablet  1 tablet Oral BID Dylan Ruotolo, Anabel BeneManish J, MD        Cardiovascular studies:   EKG 12/07/2018: Atrial fibrillation with controlled ventricular rate Left bundle branch block.   EKG 11/04/2018: Afib w/RVR. IVCD  LE US 11/02/2018: Right: There is no evidence of deep vein thrombosis in the lower extremity. No cystic structure found in the popliteal fossa.  Echocardiogram 10/29/2018:  1. The left ventricle has severely reduced systolic function, with an ejection fraction of 20-25%. The cavity size was mildly dilated. There is moderately increased left ventricular wall thickness. Left ventricular diastolic function could not be  evaluated secondary to atrial fibrillation.  2. The right ventricle has moderately reduced systolic function. The cavity was normal. There is mildly increased right ventricular wall thickness.  3. Left atrial size was moderately dilated.  4. The aortic valve is tricuspid Aortic valve regurgitation is trivial by color flow Doppler.  5. The aortic root and ascending aorta are normal in size and structure.  6. The inferior vena cava was dilated in size with <50% respiratory variability.  7. The interatrial septum was not assessed.  Recent labs: Results for [name], Asier K (MRN [id number]) as of 12/07/2018 11:20  Ref. Range 11/04/2018 08:13 11/04/2018 08:31  BASIC METABOLIC PANEL Unknown Rpt (A)   Sodium Latest Ref Range: 135 - 145 mmol/L 140   Potassium Latest Ref Range: 3.5 - 5.1 mmol/L 3.5   Chloride Latest Ref Range: 98 - 111 mmol/L 104   CO2 Latest Ref Range: 22 - 32 mmol/L 27   Glucose Latest Ref Range: 70 - 99 mg/dL 409109 (H)   BUN Latest Ref Range: 6 - 20 mg/dL 25 (H)   Creatinine Latest Ref Range: 0.61 - 1.24 mg/dL 8.111.37 (H)   Calcium Latest Ref Range: 8.9 - 10.3 mg/dL 9.0   Anion gap Latest Ref Range: 5 - 15  9   Alkaline Phosphatase Latest Ref Range: 38 - 126 U/L  85  Albumin Latest Ref Range: 3.5 - 5.0 g/dL  3.5  AST Latest Ref Range: 15 - 41 U/L  21  ALT Latest Ref Range: 0 - 44 U/L  20  Total Protein Latest Ref Range: 6.5 - 8.1 g/dL   6.7  Bilirubin, Direct Latest Ref Range: 0.0 - 0.2 mg/dL  0.3 (H)  Indirect Bilirubin Latest Ref Range: 0.3 - 0.9 mg/dL  0.8  Total Bilirubin Latest Ref Range:  0.3 - 1.2 mg/dL  1.1  GFR, Est Non African American Latest Ref Range: >60 mL/min >60   GFR, Est African American Latest Ref Range: >60 mL/min >60    Results for ELIHU, MILSTEIN (MRN [id number]) as of 12/07/2018 11:20  Ref. Range 11/04/2018 08:13  WBC Latest Ref Range: 4.0 - 10.5 K/uL 10.7 (H)  RBC Latest Ref Range: 4.22 - 5.81 MIL/uL 4.62  Hemoglobin Latest Ref Range: 13.0 - 17.0 g/dL 82.9 (L)  HCT Latest Ref Range: 39.0 - 52.0 % 38.5 (L)  MCV Latest Ref Range: 80.0 - 100.0 fL 83.3  MCH Latest Ref Range: 26.0 - 34.0 pg 24.7 (L)  MCHC Latest Ref Range: 30.0 - 36.0 g/dL 56.2 (L)  RDW Latest Ref Range: 11.5 - 15.5 % 18.3 (H)  Platelets Latest Ref Range: 150 - 400 K/uL 386  nRBC Latest Ref Range: 0.0 - 0.2 % 0.0   Results for TOREN, TUCHOLSKI (MRN [id number]) as of 12/07/2018 11:20  Ref. Range 02/20/2016 14:53 10/31/2016 00:15 10/31/2016 03:40 10/29/2018 03:12 10/29/2018 09:18 10/29/2018 15:03 11/04/2018 08:31  Troponin I Latest Ref Range: <0.03 ng/mL 0.20 (HH) 0.10 (HH) 0.14 (HH) 0.09 (HH) 0.07 (HH) 0.06 (HH) 0.08 (HH)    Review of Systems  Constitution: Negative for decreased appetite, malaise/fatigue, weight gain and weight loss.  HENT: Negative for congestion.   Eyes: Negative for visual disturbance.  Cardiovascular: Positive for dyspnea on exertion (Improved.  Still has class II symptoms) and leg swelling (Improved on the left side.  Significant swelling in right knee.). Negative for chest pain, claudication, palpitations and syncope.  Respiratory: Positive for shortness of breath (Improved).   Endocrine: Negative for cold intolerance.  Hematologic/Lymphatic: Does not bruise/bleed easily.  Skin: Negative for itching and rash.  Musculoskeletal: Positive for joint pain (Right knee) and joint swelling (Right knee). Negative for myalgias.   Gastrointestinal: Negative for abdominal pain, nausea and vomiting.  Genitourinary: Negative for dysuria.  Neurological: Negative for dizziness and weakness.  Psychiatric/Behavioral: The patient is not nervous/anxious.   All other systems reviewed and are negative.        There were no vitals filed for this visit.  Objective:   Physical Exam  Constitutional: He is oriented to person, place, and time. He appears well-developed and well-nourished. No distress.  HENT:  Head: Normocephalic and atraumatic.  Eyes: Pupils are equal, round, and reactive to light. Conjunctivae are normal.  Neck: No JVD present.  Cardiovascular: Normal rate and intact distal pulses. An irregularly irregular rhythm present.  No murmur heard. Pulmonary/Chest: Effort normal and breath sounds normal. He has no wheezes. He has no rales.  Abdominal: Soft. Bowel sounds are normal. There is no rebound.  Musculoskeletal:        General: Tenderness (Right knee. ) and edema (Trace edema left leg.  1+ edema right leg.  Massive right knee swelling.) present.  Lymphadenopathy:    He has no cervical adenopathy.  Neurological: He is alert and oriented to person, place, and time. No cranial nerve deficit.  Skin: Skin is warm and dry.  Psychiatric: He has a normal mood and affect.  Nursing note and vitals reviewed.         Assessment & Recommendations:   48 y.o. African American male  with hypertension, possible OSA, h/o RLE DVT (2017), persistent Afib, CKD3, now with new diagnosis of HFrEF.  *** 1. Chronic systolic heart failure (HCC) Most likely etiology is hypertensive cardiomyopathy, although ischemic cardiomyopathy, as well as amyloidosis remain in the  differential.  Overall, improvement in his heart failure symptoms.  Still has NYHA class II symptoms.   Blood pressure significantly improved. Continue carvedilol 25 mg twice daily. To further optimize his heart failure therapy, I have stopped his lisinopril 40  mg today.  I have given him samples of Entresto 49-51 mg twice daily, to be started on April 30. I have increased his BiDil to 20-37.5 mg 1 tablet 3 times a day to continue blood pressure control. Repeat blood work on May 7.  I will see him for virtual visit follow-up on the 11th. In future, I plan to add spironolactone.  When compensated from heart failure standpoint, he will need ischemia evaluation.  *** 2. Elevated troponin Seen on multiple ED admissions. I do not think this reflects type 1 MI, likely reflects type 2 MI due to heart failure.   *** 3. Persistent atrial fibrillation Continue coreg 25 mg bid.  CHA2DS2VASc score 3. Annual stroke risk 4% He has been out of Xarelto. Gave samples and prescription for Eliquis  Mg bid.   *** 4. Essential hypertension Better controlled.  *** 5. Right knee pain: This remains patients biggest complaint at this time. He will be going to the urgent care/ED given acuity of symptoms.   Elder Negus, MD [facility] Pager: [phone] Office: [phone] If no answer Cell [phone]

## 2018-12-27 ENCOUNTER — Ambulatory Visit: Payer: Self-pay | Admitting: Cardiology

## 2018-12-28 ENCOUNTER — Ambulatory Visit: Payer: Self-pay | Admitting: Cardiology

## 2018-12-31 MED FILL — CARVEDILOL 25 MG TABLET: 25 | 30 days supply | Qty: 60 | Fill #3

## 2019-01-31 MED FILL — CARVEDILOL 25 MG TABLET: 25 | 30 days supply | Qty: 60 | Fill #4

## 2019-03-03 MED FILL — FUROSEMIDE 40 MG TAB: 40 | 30 days supply | Qty: 60 | Fill #0

## 2019-03-03 MED FILL — CARVEDILOL 25 MG TABLET: 25 | 30 days supply | Qty: 60 | Fill #5

## 2019-03-10 ENCOUNTER — Ambulatory Visit: Payer: Medicaid Other | Admitting: Cardiology

## 2019-03-10 ENCOUNTER — Encounter: Payer: Self-pay | Admitting: Cardiology

## 2019-03-10 ENCOUNTER — Other Ambulatory Visit: Payer: Self-pay

## 2019-03-10 VITALS — Ht 72.0 in | Wt 256.0 lb

## 2019-03-10 DIAGNOSIS — I4819 Other persistent atrial fibrillation: Secondary | ICD-10-CM | POA: Diagnosis not present

## 2019-03-10 DIAGNOSIS — I5022 Chronic systolic (congestive) heart failure: Secondary | ICD-10-CM | POA: Diagnosis not present

## 2019-03-10 MED ORDER — ENTRESTO 49-51 MG PO TABS: 1.0000 | ORAL_TABLET | Freq: Two times a day (BID) | ORAL | 2 refills | Status: DC

## 2019-03-10 NOTE — Progress Notes (Signed)
Follow up visit  Subjective:   Cody [name], male    DOB: 1971/02/23, 48 y.o.   MRN: [id number]   I connected with the patient on 03/10/2019 by a telephone call and verified that I am speaking with the correct person using two identifiers.     I offered the patient a video enabled application for a virtual visit. Unfortunately, this could not be accomplished due to technical difficulties/lack of video enabled phone/computer. I discussed the limitations of evaluation and management by telemedicine and the availability of in person appointments. The patient expressed understanding and agreed to proceed.   This visit type was conducted due to national recommendations for restrictions regarding the COVID-19 Pandemic (e.g. social distancing).  This format is felt to be most appropriate for this patient at this time.  All issues noted in this document were discussed and addressed.  No physical exam was performed (except for noted visual exam findings with Tele health visits).  The patient has consented to conduct a Tele health visit and understands insurance will be billed.   Chief complaint: Cardiomyopathy  48 y.o. African American male  with hypertension, possible OSA, h/o RLE DVT (2017), persistent Afib, CKD3, HFrEF.  Patient is doing much better. He has no edema, shortness of breath at this time. He does not check his blood pressure regularly. He is compliant with his medical therapy.   Past Medical History:  Diagnosis Date  . Atrial fibrillation (HCC)   . CKD (chronic kidney disease), stage III (HCC)    Cody [name]/notes 02/20/2016  . DVT (deep venous thrombosis) (HCC) 01/2016   a. RLE - D[place] 01/2016 [place] - Took Xarelto for 30 days - out x 2 wks.  [name]. Dyspnea   . Hypertension   . Hypertensive heart disease with CHF (congestive heart failure) (HCC)      Past Surgical History:  Procedure Laterality Date  . NO PAST SURGERIES       Social History   Socioeconomic History  . Marital status:  Legally Separated    Spouse name: Not on file  . Number of children: 1  . Years of education: Not on file  . Highest education level: Not on file  Occupational History  . Not on file  Social Needs  . Financial resource strain: Not on file  . Food insecurity    Worry: Not on file    Inability: Not on file  . Transportation needs    Medical: Not on file    Non-medical: Not on file  Tobacco Use  . Smoking status: Never Smoker  . Smokeless tobacco: Never Used  Substance and Sexual Activity  . Alcohol use: No  . Drug use: No  . Sexual activity: Yes  Lifestyle  . Physical activity    Days per week: Not on file    Minutes per session: Not on file  . Stress: Not on file  Relationships  . Social Musicianconnections    Talks on phone: Not on file    Gets together: Not on file    Attends religious service: Not on file    Active member of club or organization: Not on file    Attends meetings of clubs or organizations: Not on file    Relationship status: Not on file  . Intimate partner violence    Fear of current or ex partner: Not on file    Emotionally abused: Not on file    Physically abused: Not on file    Forced  sexual activity: Not on file  Other Topics Concern  . Not on file  Social History Narrative   Lives in [place].  Works in Lobbyist - welds, spends time on a Engineer, manufacturing.     Family History  Problem Relation Age of Onset  . Asthma Mother        alive and well  . Hypertension Father        alive  . Lung cancer Father   . Cancer Father      Current Outpatient Medications on File Prior to Visit  Medication Sig Dispense Refill  . apixaban (ELIQUIS) 5 MG TABS tablet Take 1 tablet (5 mg total) by mouth 2 (two) times daily. 60 tablet 2  . carvedilol (COREG) 25 MG tablet Take 25 mg by mouth 2 (two) times daily with a meal.    . furosemide (LASIX) 40 MG tablet Take 1 tablet (40 mg total) by mouth daily. 60 tablet 2  . isosorbide-hydrALAZINE (BIDIL) 20-37.5 MG  tablet Take 1 tablet by mouth 3 (three) times daily. 90 tablet 2  . oxyCODONE-acetaminophen (PERCOCET/ROXICET) 5-325 MG tablet Take 1-2 tablets by mouth every 6 (six) hours as needed for severe pain. 8 tablet 0  . potassium chloride SA (K-DUR) 20 MEQ tablet Take 20 mEq by mouth 2 (two) times daily.    . predniSONE (STERAPRED UNI-PAK 21 TAB) 10 MG (21) TBPK tablet Take by mouth daily. Take 6 tabs by mouth daily  for 2 days, then 5 tabs for 2 days, then 4 tabs for 2 days, then 3 tabs for 2 days, 2 tabs for 2 days, then 1 tab by mouth daily for 2 days 42 tablet 0   Current Facility-Administered Medications on File Prior to Visit  Medication Dose Route Frequency Provider Last Rate Last Dose  . isosorbide-hydrALAZINE (BIDIL) 20-37.5 MG per tablet 1 tablet  1 tablet Oral BID Luka Reisch J, MD      . sacubitril-valsartan (ENTRESTO) 49-51 mg per tablet  1 tablet Oral BID Dimitrious Micciche, Reynold Bowen, MD        Cardiovascular studies:  EKG 12/07/2018: Atrial fibrillation with controlled ventricular rate Left bundle branch block.   EKG 11/04/2018: Afib w/RVR. IVCD  LE [place] 11/02/2018: Right: There is no evidence of deep vein thrombosis in the lower extremity. No cystic structure found in the popliteal fossa.  Echocardiogram 10/29/2018:  1. The left ventricle has severely reduced systolic function, with an ejection fraction of 20-25%. The cavity size was mildly dilated. There is moderately increased left ventricular wall thickness. Left ventricular diastolic function could not be  evaluated secondary to atrial fibrillation.  2. The right ventricle has moderately reduced systolic function. The cavity was normal. There is mildly increased right ventricular wall thickness.  3. Left atrial size was moderately dilated.  4. The aortic valve is tricuspid Aortic valve regurgitation is trivial by color flow Doppler.  5. The aortic root and ascending aorta are normal in size and structure.  6. The inferior  vena cava was dilated in size with <50% respiratory variability.  7. The interatrial septum was not assessed.  Recent labs: Results for [place], [name] (MRN [id number]) as of 12/07/2018 11:20  Ref. Range 11/04/2018 08:13 11/04/2018 53:66  BASIC METABOLIC PANEL Unknown Rpt (A)   Sodium Latest Ref Range: 135 - 145 mmol/L 140   Potassium Latest Ref Range: 3.5 - 5.1 mmol/L 3.5   Chloride Latest Ref Range: 98 - 111 mmol/L 104   CO2 Latest Ref  Range: 22 - 32 mmol/L 27   Glucose Latest Ref Range: 70 - 99 mg/dL 202 (H)   BUN Latest Ref Range: 6 - 20 mg/dL 25 (H)   Creatinine Latest Ref Range: 0.61 - 1.24 mg/dL 5.42 (H)   Calcium Latest Ref Range: 8.9 - 10.3 mg/dL 9.0   Anion gap Latest Ref Range: 5 - 15  9   Alkaline Phosphatase Latest Ref Range: 38 - 126 U/L  85  Albumin Latest Ref Range: 3.5 - 5.0 g/dL  3.5  AST Latest Ref Range: 15 - 41 U/L  21  ALT Latest Ref Range: 0 - 44 U/L  20  Total Protein Latest Ref Range: 6.5 - 8.1 g/dL  6.7  Bilirubin, Direct Latest Ref Range: 0.0 - 0.2 mg/dL  0.3 (H)  Indirect Bilirubin Latest Ref Range: 0.3 - 0.9 mg/dL  0.8  Total Bilirubin Latest Ref Range: 0.3 - 1.2 mg/dL  1.1  GFR, Est Non African American Latest Ref Range: >60 mL/min >60   GFR, Est African American Latest Ref Range: >60 mL/min >60    Results for ADRIAL, TY (MRN [id number]) as of 12/07/2018 11:20  Ref. Range 11/04/2018 08:13  WBC Latest Ref Range: 4.0 - 10.5 K/uL 10.7 (H)  RBC Latest Ref Range: 4.22 - 5.81 MIL/uL 4.62  Hemoglobin Latest Ref Range: 13.0 - 17.0 g/dL 31.5 (L)  HCT Latest Ref Range: 39.0 - 52.0 % 38.5 (L)  MCV Latest Ref Range: 80.0 - 100.0 fL 83.3  MCH Latest Ref Range: 26.0 - 34.0 pg 24.7 (L)  MCHC Latest Ref Range: 30.0 - 36.0 g/dL 17.6 (L)  RDW Latest Ref Range: 11.5 - 15.5 % 18.3 (H)  Platelets Latest Ref Range: 150 - 400 K/uL 386  nRBC Latest Ref Range: 0.0 - 0.2 % 0.0   Results for HY, SCHUFF (MRN [id number]) as of 12/07/2018 11:20  Ref. Range 02/20/2016 14:53  10/31/2016 00:15 10/31/2016 03:40 10/29/2018 03:12 10/29/2018 09:18 10/29/2018 15:03 11/04/2018 08:31  Troponin I Latest Ref Range: <0.03 ng/mL 0.20 (HH) 0.10 (HH) 0.14 (HH) 0.09 (HH) 0.07 (HH) 0.06 (HH) 0.08 (HH)    Review of Systems  Constitution: Negative for decreased appetite, malaise/fatigue, weight gain and weight loss.  HENT: Negative for congestion.   Eyes: Negative for visual disturbance.  Cardiovascular: Positive for dyspnea on exertion (Improved.  Still has class II symptoms) and leg swelling (Improved on the left side.  Significant swelling in right knee.). Negative for chest pain, claudication, palpitations and syncope.  Respiratory: Positive for shortness of breath (Improved).   Endocrine: Negative for cold intolerance.  Hematologic/Lymphatic: Does not bruise/bleed easily.  Skin: Negative for itching and rash.  Musculoskeletal: Positive for joint pain (Right knee) and joint swelling (Right knee). Negative for myalgias.  Gastrointestinal: Negative for abdominal pain, nausea and vomiting.  Genitourinary: Negative for dysuria.  Neurological: Negative for dizziness and weakness.  Psychiatric/Behavioral: The patient is not nervous/anxious.   All other systems reviewed and are negative.        Vitals not available  Objective:   Physical Exam  Telephone visit. No physical exam performed/        Assessment & Recommendations:   48 y.o. African American male  with hypertension, possible OSA, h/o RLE DVT (2017), persistent Afib, CKD3, now with new diagnosis of HFrEF.  1. Chronic systolic heart failure (HCC) Bases on telephone visit, he seems to have improved. Continue current guideline directed medical therapy including carvedilol 25 mg twice daily, Entresto 49-51 mg twice daily,  BiDil to 20-37.5 mg 1 tablet 3 times a day At some point, he will need ischemic evaluation.   3. Persistent atrial fibrillation Continue coreg 25 mg bid.  CHA2DS2VASc score 3. Annual stroke risk 4%  Continue Eliquis 5 mg bid.   4. Essential hypertension Better controlled.  F/u in 2 wks after BMP check  Elder NegusManish J Trygve Thal, MD [facility] Pager: [phone] Office: [phone] If no answer Cell [phone]

## 2019-03-11 ENCOUNTER — Encounter: Payer: Self-pay | Admitting: Cardiology

## 2019-03-17 ENCOUNTER — Other Ambulatory Visit: Payer: Self-pay

## 2019-03-17 DIAGNOSIS — I5022 Chronic systolic (congestive) heart failure: Secondary | ICD-10-CM

## 2019-03-17 MED ORDER — ENTRESTO 49-51 MG PO TABS: 1.0000 | ORAL_TABLET | Freq: Two times a day (BID) | ORAL | 2 refills | Status: DC

## 2019-03-17 MED FILL — ENTRESTO 49 MG-51 MG TABLET: 49-51 | 30 days supply | Qty: 60 | Fill #0

## 2019-03-25 MED FILL — FUROSEMIDE 40 MG TAB: 40 | 30 days supply | Qty: 60 | Fill #1

## 2019-03-28 ENCOUNTER — Ambulatory Visit: Payer: Medicaid Other | Admitting: Cardiology

## 2019-03-28 NOTE — Progress Notes (Deleted)
Follow up visit  Subjective:   Cody [name], male    DOB: Aug 12, 1971, 48 y.o.   MRN: [id number]   Chief complaint: Cardiomyopathy  48 y.o. African American male  with hypertension, possible OSA, h/o RLE DVT (2017), persistent Afib, CKD3, HFrEF.  Patient is doing much better. He has no edema, shortness of breath at this time. He does not check his blood pressure regularly. He is compliant with his medical therapy.   Past Medical History:  Diagnosis Date  . Atrial fibrillation (HCC)   . CKD (chronic kidney disease), stage III (HCC)    [name]/notes 02/20/2016  . DVT (deep venous thrombosis) (HCC) 01/2016   a. RLE - Dx 01/2016 [place] - Took Xarelto for 30 days - out x 2 wks.  [name]. Dyspnea   . Hypertension   . Hypertensive heart disease with CHF (congestive heart failure) (HCC)      Past Surgical History:  Procedure Laterality Date  . NO PAST SURGERIES       Social History   Socioeconomic History  . Marital status: Legally Separated    Spouse name: Not on file  . Number of children: 1  . Years of education: Not on file  . Highest education level: Not on file  Occupational History  . Not on file  Social Needs  . Financial resource strain: Not on file  . Food insecurity    Worry: Not on file    Inability: Not on file  . Transportation needs    Medical: Not on file    Non-medical: Not on file  Tobacco Use  . Smoking status: Never Smoker  . Smokeless tobacco: Never Used  Substance and Sexual Activity  . Alcohol use: No  . Drug use: No  . Sexual activity: Yes  Lifestyle  . Physical activity    Days per week: Not on file    Minutes per session: Not on file  . Stress: Not on file  Relationships  . Social Musicianconnections    Talks on phone: Not on file    Gets together: Not on file    Attends religious service: Not on file    Active member of club or organization: Not on file    Attends meetings of clubs or organizations: Not on file    Relationship status: Not on file   . Intimate partner violence    Fear of current or ex partner: Not on file    Emotionally abused: Not on file    Physically abused: Not on file    Forced sexual activity: Not on file  Other Topics Concern  . Not on file  Social History Narrative   [place]  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     Family History  Problem Relation Age of Onset  . Asthma Mother        alive and well  . Hypertension Father        alive  . Lung cancer Father   . Cancer Father      Current Outpatient Medications on File Prior to Visit  Medication Sig Dispense Refill  . apixaban (ELIQUIS) 5 MG TABS tablet Take 1 tablet (5 mg total) by mouth 2 (two) times daily. 60 tablet 2  . carvedilol (COREG) 25 MG tablet Take 25 mg by mouth 2 (two) times daily with a meal.    . furosemide (LASIX) 40 MG tablet Take 1 tablet (40 mg total)  by mouth daily. 60 tablet 2  . isosorbide-hydrALAZINE (BIDIL) 20-37.5 MG tablet Take 1 tablet by mouth 3 (three) times daily. 90 tablet 2  . potassium chloride SA (K-DUR) 20 MEQ tablet Take 20 mEq by mouth daily.     . sacubitril-valsartan (ENTRESTO) 49-51 MG Take 1 tablet by mouth 2 (two) times daily. 60 tablet 2   Current Facility-Administered Medications on File Prior to Visit  Medication Dose Route Frequency Provider Last Rate Last Dose  . isosorbide-hydrALAZINE (BIDIL) 20-37.5 MG per tablet 1 tablet  1 tablet Oral BID Patwardhan, Anabel Bene, MD        Cardiovascular studies:  EKG 12/07/2018: Atrial fibrillation with controlled ventricular rate Left bundle branch block.   EKG 11/04/2018: Afib w/RVR. IVCD  LE [place] 11/02/2018: Right: There is no evidence of deep vein thrombosis in the lower extremity. No cystic structure found in the popliteal fossa.  Echocardiogram 10/29/2018:  1. The left ventricle has severely reduced systolic function, with an ejection fraction of 20-25%. The cavity size was mildly dilated. There is moderately  increased left ventricular wall thickness. Left ventricular diastolic function could not be  evaluated secondary to atrial fibrillation.  2. The right ventricle has moderately reduced systolic function. The cavity was normal. There is mildly increased right ventricular wall thickness.  3. Left atrial size was moderately dilated.  4. The aortic valve is tricuspid Aortic valve regurgitation is trivial by color flow Doppler.  5. The aortic root and ascending aorta are normal in size and structure.  6. The inferior vena cava was dilated in size with <50% respiratory variability.  7. The interatrial septum was not assessed.  Recent labs: Results for [place], [name] (MRN [id number]) as of 12/07/2018 11:20  Ref. Range 11/04/2018 08:13 11/04/2018 08:31  BASIC METABOLIC PANEL Unknown Rpt (A)   Sodium Latest Ref Range: 135 - 145 mmol/L 140   Potassium Latest Ref Range: 3.5 - 5.1 mmol/L 3.5   Chloride Latest Ref Range: 98 - 111 mmol/L 104   CO2 Latest Ref Range: 22 - 32 mmol/L 27   Glucose Latest Ref Range: 70 - 99 mg/dL 390 (H)   BUN Latest Ref Range: 6 - 20 mg/dL 25 (H)   Creatinine Latest Ref Range: 0.61 - 1.24 mg/dL 3.00 (H)   Calcium Latest Ref Range: 8.9 - 10.3 mg/dL 9.0   Anion gap Latest Ref Range: 5 - 15  9   Alkaline Phosphatase Latest Ref Range: 38 - 126 U/L  85  Albumin Latest Ref Range: 3.5 - 5.0 g/dL  3.5  AST Latest Ref Range: 15 - 41 U/L  21  ALT Latest Ref Range: 0 - 44 U/L  20  Total Protein Latest Ref Range: 6.5 - 8.1 g/dL  6.7  Bilirubin, Direct Latest Ref Range: 0.0 - 0.2 mg/dL  0.3 (H)  Indirect Bilirubin Latest Ref Range: 0.3 - 0.9 mg/dL  0.8  Total Bilirubin Latest Ref Range: 0.3 - 1.2 mg/dL  1.1  GFR, Est Non African American Latest Ref Range: >60 mL/min >60   GFR, Est African American Latest Ref Range: >60 mL/min >60    Results for [place], [name] (MRN [id number]) as of 12/07/2018 11:20  Ref. Range 11/04/2018 08:13  WBC Latest Ref Range: 4.0 - 10.5 K/uL 10.7 (H)  RBC Latest  Ref Range: 4.22 - 5.81 MIL/uL 4.62  Hemoglobin Latest Ref Range: 13.0 - 17.0 g/dL 26.3 (L)  HCT Latest Ref Range: 39.0 - 52.0 % 38.5 (L)  MCV Latest Ref  Range: 80.0 - 100.0 fL 83.3  MCH Latest Ref Range: 26.0 - 34.0 pg 24.7 (L)  MCHC Latest Ref Range: 30.0 - 36.0 g/dL 29.6 (L)  RDW Latest Ref Range: 11.5 - 15.5 % 18.3 (H)  Platelets Latest Ref Range: 150 - 400 K/uL 386  nRBC Latest Ref Range: 0.0 - 0.2 % 0.0   Results for MOLLY, MASELLI (MRN [id number]) as of 12/07/2018 11:20  Ref. Range 02/20/2016 14:53 10/31/2016 00:15 10/31/2016 03:40 10/29/2018 03:12 10/29/2018 09:18 10/29/2018 15:03 11/04/2018 08:31  Troponin I Latest Ref Range: <0.03 ng/mL 0.20 (HH) 0.10 (HH) 0.14 (HH) 0.09 (HH) 0.07 (HH) 0.06 (HH) 0.08 (HH)    Review of Systems  Constitution: Negative for decreased appetite, malaise/fatigue, weight gain and weight loss.  HENT: Negative for congestion.   Eyes: Negative for visual disturbance.  Cardiovascular: Positive for dyspnea on exertion (Improved.  Still has class II symptoms) and leg swelling (Improved on the left side.  Significant swelling in right knee.). Negative for chest pain, claudication, palpitations and syncope.  Respiratory: Positive for shortness of breath (Improved).   Endocrine: Negative for cold intolerance.  Hematologic/Lymphatic: Does not bruise/bleed easily.  Skin: Negative for itching and rash.  Musculoskeletal: Positive for joint pain (Right knee) and joint swelling (Right knee). Negative for myalgias.  Gastrointestinal: Negative for abdominal pain, nausea and vomiting.  Genitourinary: Negative for dysuria.  Neurological: Negative for dizziness and weakness.  Psychiatric/Behavioral: The patient is not nervous/anxious.   All other systems reviewed and are negative.        Vitals not available  Objective:  Physical Exam   Assessment & Recommendations:   48 y.o. African American male  with hypertension, possible OSA, h/o RLE DVT (2017), persistent Afib,  CKD3, now with new diagnosis of HFrEF.  1. Chronic systolic heart failure ([place]) Bases on telephone visit, he seems to have improved. Continue current guideline directed medical therapy including carvedilol 25 mg twice daily, Entresto 49-51 mg twice daily, BiDil to 20-37.5 mg 1 tablet 3 times a day At some point, he will need ischemic evaluation.   3. Persistent atrial fibrillation Continue coreg 25 mg bid.  CHA2DS2VASc score 3. Annual stroke risk 4% Continue Eliquis 5 mg bid.   4. Essential hypertension Better controlled.   Nigel Mormon, MD [facility] Pager: [phone] Office: [phone] If no answer Cell [phone]

## 2019-04-12 MED FILL — CARVEDILOL 25 MG TABLET: 25 | 30 days supply | Qty: 60 | Fill #6

## 2019-05-03 MED FILL — ENTRESTO 49 MG-51 MG TABLET: 49-51 | 30 days supply | Qty: 60 | Fill #1

## 2019-05-03 MED FILL — FUROSEMIDE 40 MG TAB: 40 | 30 days supply | Qty: 60 | Fill #2

## 2019-05-18 MED FILL — CARVEDILOL 25 MG TABLET: 25 | 30 days supply | Qty: 60 | Fill #0

## 2019-06-03 MED FILL — ENTRESTO 49 MG-51 MG TABLET: 49-51 | 30 days supply | Qty: 60 | Fill #2

## 2019-06-03 MED FILL — FUROSEMIDE 40 MG TAB: 40 | 30 days supply | Qty: 60 | Fill #2

## 2019-06-03 MED FILL — LISINOPRIL 40 MG TABLET: 40 | 30 days supply | Qty: 30 | Fill #3

## 2019-06-17 ENCOUNTER — Other Ambulatory Visit: Payer: Self-pay

## 2019-06-17 DIAGNOSIS — I5022 Chronic systolic (congestive) heart failure: Secondary | ICD-10-CM

## 2019-06-17 MED ORDER — CARVEDILOL 25 MG PO TABS: 25.0000 mg | ORAL_TABLET | Freq: Two times a day (BID) | ORAL | 3 refills | Status: DC

## 2019-06-17 MED FILL — CARVEDILOL 25 MG TABLET: 25 | 90 days supply | Qty: 180 | Fill #0

## 2019-06-30 ENCOUNTER — Ambulatory Visit: Payer: Medicaid Other | Admitting: Cardiology

## 2019-07-04 MED FILL — ENTRESTO 49 MG-51 MG TABLET: 49-51 | 30 days supply | Qty: 60 | Fill #0

## 2019-07-11 MED FILL — FUROSEMIDE 40 MG TAB: 40 | 30 days supply | Qty: 60 | Fill #3

## 2019-07-22 ENCOUNTER — Ambulatory Visit: Payer: Medicaid Other | Admitting: Cardiology

## 2019-07-22 ENCOUNTER — Other Ambulatory Visit: Payer: Medicaid Other

## 2019-07-22 NOTE — Progress Notes (Deleted)
Follow up visit  Subjective:   Cody [name], male    DOB: 31-Dec-1970, 48 y.o.   MRN: [id number]   Chief complaint: Cardiomyopathy  48 y.o. African American male  with hypertension, possible OSA, h/o RLE DVT (2017), persistent Afib, CKD3, now with new diagnosis of HFrEF.  *** Patient is doing much better. He has no edema, shortness of breath at this time. He does not check his blood pressure regularly. He is compliant with his medical therapy.   Past Medical History:  Diagnosis Date  . Atrial fibrillation (HCC)   . CKD (chronic kidney disease), stage III (HCC)    Cody [name] 02/20/2016  . DVT (deep venous thrombosis) (HCC) 01/2016   a. RLE - Dx 01/2016 in [name] - Took Xarelto for 30 days - out x 2 wks.  [name]   . Hypertension   . Hypertensive heart disease with CHF (congestive heart failure) (HCC)      Past Surgical History:  Procedure Laterality Date  . NO PAST SURGERIES       Social History   Socioeconomic History  . Marital status: Legally Separated    Spouse name: Not on file  . Number of children: 1  . Years of education: Not on file  . Highest education level: Not on file  Occupational History  . Not on file  Social Needs  . Financial resource strain: Not on file  . Food insecurity    Worry: Not on file    Inability: Not on file  . Transportation needs    Medical: Not on file    Non-medical: Not on file  Tobacco Use  . Smoking status: Never Smoker  . Smokeless tobacco: Never Used  Substance and Sexual Activity  . Alcohol use: No  . Drug use: No  . Sexual activity: Yes  Lifestyle  . Physical activity    Days per week: Not on file    Minutes per session: Not on file  . Stress: Not on file  Relationships  . Social Musician on phone: Not on file    Gets together: Not on file    Attends religious service: Not on file    Active member of club or organization: Not on file    Attends meetings of clubs or organizations: Not on file   Relationship status: Not on file  . Intimate partner violence    Fear of current or ex partner: Not on file    Emotionally abused: Not on file    Physically abused: Not on file    Forced sexual activity: Not on file  Other Topics Concern  . Not on file  Social History Narrative   Lives in [place].  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     Family History  Problem Relation Age of Onset  . Asthma Mother        alive and well  . Hypertension Father        alive  . Lung cancer Father   . Cancer Father      Current Outpatient Medications on File Prior to Visit  Medication Sig Dispense Refill  . apixaban (ELIQUIS) 5 MG TABS tablet Take 1 tablet (5 mg total) by mouth 2 (two) times daily. 60 tablet 2  . carvedilol (COREG) 25 MG tablet Take 1 tablet (25 mg total) by mouth 2 (two) times daily with a meal. 180 tablet 3  . furosemide (LASIX) 40  MG tablet Take 1 tablet (40 mg total) by mouth daily. 60 tablet 2  . isosorbide-hydrALAZINE (BIDIL) 20-37.5 MG tablet Take 1 tablet by mouth 3 (three) times daily. 90 tablet 2  . potassium chloride SA (K-DUR) 20 MEQ tablet Take 20 mEq by mouth daily.     . sacubitril-valsartan (ENTRESTO) 49-51 MG Take 1 tablet by mouth 2 (two) times daily. 60 tablet 2   Current Facility-Administered Medications on File Prior to Visit  Medication Dose Route Frequency Provider Last Rate Last Dose  . isosorbide-hydrALAZINE (BIDIL) 20-37.5 MG per tablet 1 tablet  1 tablet Oral BID Katira Dumais, Reynold Bowen, MD        Cardiovascular studies:  EKG 12/07/2018: Atrial fibrillation with controlled ventricular rate Left bundle branch block.   EKG 11/04/2018: Afib w/RVR. IVCD  LE [place] 11/02/2018: Right: There is no evidence of deep vein thrombosis in the lower extremity. No cystic structure found in the popliteal fossa.  Echocardiogram 10/29/2018:  1. The left ventricle has severely reduced systolic function, with an ejection fraction of  20-25%. The cavity size was mildly dilated. There is moderately increased left ventricular wall thickness. Left ventricular diastolic function could not be  evaluated secondary to atrial fibrillation.  2. The right ventricle has moderately reduced systolic function. The cavity was normal. There is mildly increased right ventricular wall thickness.  3. Left atrial size was moderately dilated.  4. The aortic valve is tricuspid Aortic valve regurgitation is trivial by color flow Doppler.  5. The aortic root and ascending aorta are normal in size and structure.  6. The inferior vena cava was dilated in size with <50% respiratory variability.  7. The interatrial septum was not assessed.  Recent labs: Results for Cody, [name] (MRN [id number]) as of 12/07/2018 11:20  Ref. Range 11/04/2018 08:13 11/04/2018 79:02  BASIC METABOLIC PANEL Unknown Rpt (A)   Sodium Latest Ref Range: 135 - 145 mmol/L 140   Potassium Latest Ref Range: 3.5 - 5.1 mmol/L 3.5   Chloride Latest Ref Range: 98 - 111 mmol/L 104   CO2 Latest Ref Range: 22 - 32 mmol/L 27   Glucose Latest Ref Range: 70 - 99 mg/dL 109 (H)   BUN Latest Ref Range: 6 - 20 mg/dL 25 (H)   Creatinine Latest Ref Range: 0.61 - 1.24 mg/dL 1.37 (H)   Calcium Latest Ref Range: 8.9 - 10.3 mg/dL 9.0   Anion gap Latest Ref Range: 5 - 15  9   Alkaline Phosphatase Latest Ref Range: 38 - 126 U/L  85  Albumin Latest Ref Range: 3.5 - 5.0 g/dL  3.5  AST Latest Ref Range: 15 - 41 U/L  21  ALT Latest Ref Range: 0 - 44 U/L  20  Total Protein Latest Ref Range: 6.5 - 8.1 g/dL  6.7  Bilirubin, Direct Latest Ref Range: 0.0 - 0.2 mg/dL  0.3 (H)  Indirect Bilirubin Latest Ref Range: 0.3 - 0.9 mg/dL  0.8  Total Bilirubin Latest Ref Range: 0.3 - 1.2 mg/dL  1.1  GFR, Est Non African American Latest Ref Range: >60 mL/min >60   GFR, Est African American Latest Ref Range: >60 mL/min >60    Results for [name], [name] (MRN [id number]) as of 12/07/2018 11:20  Ref. Range 11/04/2018 08:13   WBC Latest Ref Range: 4.0 - 10.5 K/uL 10.7 (H)  RBC Latest Ref Range: 4.22 - 5.81 MIL/uL 4.62  Hemoglobin Latest Ref Range: 13.0 - 17.0 g/dL 11.4 (L)  HCT Latest Ref Range: 39.0 -  52.0 % 38.5 (L)  MCV Latest Ref Range: 80.0 - 100.0 fL 83.3  MCH Latest Ref Range: 26.0 - 34.0 pg 24.7 (L)  MCHC Latest Ref Range: 30.0 - 36.0 g/dL 63.7 (L)  RDW Latest Ref Range: 11.5 - 15.5 % 18.3 (H)  Platelets Latest Ref Range: 150 - 400 K/uL 386  nRBC Latest Ref Range: 0.0 - 0.2 % 0.0   Results for SASCHA, BAUGHER (MRN [id number]) as of 12/07/2018 11:20  Ref. Range 02/20/2016 14:53 10/31/2016 00:15 10/31/2016 03:40 10/29/2018 03:12 10/29/2018 09:18 10/29/2018 15:03 11/04/2018 08:31  Troponin I Latest Ref Range: <0.03 ng/mL 0.20 (HH) 0.10 (HH) 0.14 (HH) 0.09 (HH) 0.07 (HH) 0.06 (HH) 0.08 (HH)    Review of Systems  Constitution: Negative for decreased appetite, malaise/fatigue, weight gain and weight loss.  HENT: Negative for congestion.   Eyes: Negative for visual disturbance.  Cardiovascular: Positive for dyspnea on exertion (Improved.  Still has class II symptoms) and leg swelling (Improved on the left side.  Significant swelling in right knee.). Negative for chest pain, claudication, palpitations and syncope.  Respiratory: Positive for shortness of breath (Improved).   Endocrine: Negative for cold intolerance.  Hematologic/Lymphatic: Does not bruise/bleed easily.  Skin: Negative for itching and rash.  Musculoskeletal: Positive for joint pain (Right knee) and joint swelling (Right knee). Negative for myalgias.  Gastrointestinal: Negative for abdominal pain, nausea and vomiting.  Genitourinary: Negative for dysuria.  Neurological: Negative for dizziness and weakness.  Psychiatric/Behavioral: The patient is not nervous/anxious.   All other systems reviewed and are negative.        Vitals not available  Objective:   Physical ExamTelephone visit. No physical exam performed/        Assessment &  Recommendations:   48 y.o. African American male  with hypertension, possible OSA, h/o RLE DVT (2017), persistent Afib, CKD3, now with new diagnosis of HFrEF.  1. Chronic systolic heart failure (HCC) Bases on telephone visit, he seems to have improved. Continue current guideline directed medical therapy including carvedilol 25 mg twice daily, Entresto 49-51 mg twice daily, BiDil to 20-37.5 mg 1 tablet 3 times a day At some point, he will need ischemic evaluation.   3. Persistent atrial fibrillation Continue coreg 25 mg bid.  CHA2DS2VASc score 3. Annual stroke risk 4% Continue Eliquis 5 mg bid.   4. Essential hypertension Better controlled.  F/u in 2 wks after BMP check  Elder Negus, MD [facility] Pager: [phone] Office: [phone] If no answer Cell [phone]

## 2019-07-28 MED FILL — FUROSEMIDE 40 MG TAB: 40 | 30 days supply | Qty: 120 | Fill #0

## 2019-07-29 MED FILL — ENTRESTO 49 MG-51 MG TABLET: 49-51 | 30 days supply | Qty: 60 | Fill #1

## 2019-08-30 MED FILL — ENTRESTO 49 MG-51 MG TABLET: 49-51 | 30 days supply | Qty: 60 | Fill #2

## 2019-09-09 ENCOUNTER — Other Ambulatory Visit: Payer: Self-pay | Admitting: Critical Care Medicine

## 2019-09-15 ENCOUNTER — Telehealth: Payer: Self-pay | Admitting: Critical Care Medicine

## 2019-09-15 ENCOUNTER — Other Ambulatory Visit: Payer: Self-pay | Admitting: Pharmacist

## 2019-09-15 MED ORDER — FUROSEMIDE 40 MG PO TABS: 40.0000 mg | ORAL_TABLET | Freq: Every day | ORAL | 0 refills | Status: DC

## 2019-09-15 MED FILL — FUROSEMIDE 40 MG TAB: 40 | 30 days supply | Qty: 30 | Fill #0

## 2019-09-15 NOTE — Telephone Encounter (Signed)
1) Medication(s) Requested (by name): -furosemide (LASIX) 40 MG tablet  2) Pharmacy of Choice: [facility]   3) Special Requests: Pt reports bloating, and would like enough until his appt 09/22/2019

## 2019-09-22 ENCOUNTER — Encounter: Payer: Self-pay | Admitting: Nurse Practitioner

## 2019-09-22 ENCOUNTER — Ambulatory Visit: Payer: Self-pay | Attending: Nurse Practitioner | Admitting: Nurse Practitioner

## 2019-09-22 ENCOUNTER — Other Ambulatory Visit: Payer: Self-pay

## 2019-09-22 ENCOUNTER — Ambulatory Visit (INDEPENDENT_AMBULATORY_CARE_PROVIDER_SITE_OTHER): Payer: Medicaid Other

## 2019-09-22 DIAGNOSIS — Z7689 Persons encountering health services in other specified circumstances: Secondary | ICD-10-CM

## 2019-09-22 DIAGNOSIS — N183 Chronic kidney disease, stage 3 unspecified: Secondary | ICD-10-CM

## 2019-09-22 DIAGNOSIS — I509 Heart failure, unspecified: Secondary | ICD-10-CM

## 2019-09-22 DIAGNOSIS — I5022 Chronic systolic (congestive) heart failure: Secondary | ICD-10-CM

## 2019-09-22 DIAGNOSIS — I4819 Other persistent atrial fibrillation: Secondary | ICD-10-CM

## 2019-09-22 DIAGNOSIS — I10 Essential (primary) hypertension: Secondary | ICD-10-CM

## 2019-09-22 MED ORDER — BIDIL 20-37.5 MG PO TABS: 1.0000 | ORAL_TABLET | Freq: Three times a day (TID) | ORAL | 0 refills | Status: AC

## 2019-09-22 MED ORDER — ENTRESTO 49-51 MG PO TABS: 1.0000 | ORAL_TABLET | Freq: Two times a day (BID) | ORAL | 0 refills | Status: AC

## 2019-09-22 MED FILL — BIDIL 20-37.5 MG TABS: 20-37.5 | 7 days supply | Qty: 21 | Fill #0

## 2019-09-22 MED FILL — ENTRESTO 49 MG-51 MG TABLET: 49-51 | 7 days supply | Qty: 14 | Fill #0

## 2019-09-22 MED FILL — CARVEDILOL 25 MG TABLET: 25 | 90 days supply | Qty: 180 | Fill #1

## 2019-09-22 NOTE — Progress Notes (Signed)
Virtual Visit via Telephone Note Due to national recommendations of social distancing due to [name], telehealth visit is felt to be most appropriate for this patient at this time.  I discussed the limitations, risks, security and privacy concerns of performing an evaluation and management service by telephone and the availability of in person appointments. I also discussed with the patient that there may be a patient responsible charge related to this service. The patient expressed understanding and agreed to proceed.    I connected with Cody [name] on 09/23/19  at   2:50 PM EST  EDT by telephone and verified that I am speaking with the correct person using two identifiers.   Consent I discussed the limitations, risks, security and privacy concerns of performing an evaluation and management service by telephone and the availability of in person appointments. I also discussed with the patient that there may be a patient responsible charge related to this service. The patient expressed understanding and agreed to proceed.   Location of Patient: Private Residence   Location of Provider: [place] [facility] [employer] Office    Persons participating in Telemedicine visit: Cody Rankins FNP-BC [place]    History of Present Illness: Telemedicine visit for: Establish Care  has a past medical history of Atrial fibrillation ([place]), CKD (chronic kidney disease), stage III, DVT (deep venous thrombosis) ([place]) (01/2016), Dyspnea, Hypertension, and Hypertensive heart disease with CHF (congestive heart failure) ([place]).  He is upset that he can not get medications howevr he is missing appointments.   ED Difficulty maintaining or achieving erections. Requesting medication however I have instructed him that I do not feel comfortable prescribing viagra, cialis or levitra with him taking a nitrate.    Hypertension with Heart Failure Has not seen cardiology since July 2020.  Upset  because he can not get refills of his medications. I will courtesy fill 7 day supply until he can see Cardiology next week. He no showed for his previous  follow up Cardiology appointment.  [facility] has been [facility] and [facility] BP check with most recent reading 140/109. He states he was not aware he was supposed to take Bidil. Thought he was taken off of it when in actuality he was taken off lisinopril on 12-14-2018 and started on Entresto. His Bidil was increased to 20-37.5 mg 2 tablet TID on 03-10-2019. Unclear how long he has not been taking this medication. He does endorse compliance taking coreg 25 mg BID, lasix 40 mg daily. and entresto 49-51 mg BID. States he is still taking eliquis however I do not see any refills since July 2020. Denies chest pain, shortness of breath, palpitations, lightheadedness, dizziness, headaches.     Past Medical History:  Diagnosis Date  . Atrial fibrillation ([place])   . CKD (chronic kidney disease), stage III    Cody [name] 02/20/2016  . DVT (deep venous thrombosis) ([place]) 01/2016   a. RLE - Dx 01/2016 in [place] for 30 days - out x 2 wks.  Cody [name] Dyspnea   . Hypertension   . Hypertensive heart disease with CHF (congestive heart failure) ([place])     Past Surgical History:  Procedure Laterality Date  . NO PAST SURGERIES      Family History  Problem Relation Age of Onset  . Asthma Mother        alive and well  . Hypertension Father        alive  . Lung cancer Father   .  Cancer Father     Social History   Socioeconomic History  . Marital status: Legally Separated    Spouse name: Not on file  . Number of children: 1  . Years of education: Not on file  . Highest education level: Not on file  Occupational History  . Not on file  Tobacco Use  . Smoking status: Never Smoker  . Smokeless tobacco: Never Used  Substance and Sexual Activity  . Alcohol use: No  . Drug use: No  . Sexual activity: Yes  Other Topics Concern  . Not on file  Social  History Narrative   Lives in [place].  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   Social Determinants of Health   Financial Resource Strain:   . Difficulty of Paying Living Expenses: Not on file  Food Insecurity:   . Worried About Programme researcher, broadcasting/film/video in the Last Year: Not on file  . Ran Out of Food in the Last Year: Not on file  Transportation Needs:   . Lack of Transportation (Medical): Not on file  . Lack of Transportation (Non-Medical): Not on file  Physical Activity:   . Days of Exercise per Week: Not on file  . Minutes of Exercise per Session: Not on file  Stress:   . Feeling of Stress : Not on file  Social Connections:   . Frequency of Communication with Friends and Family: Not on file  . Frequency of Social Gatherings with Friends and Family: Not on file  . Attends Religious Services: Not on file  . Active Member of Clubs or Organizations: Not on file  . Attends Banker Meetings: Not on file  . Marital Status: Not on file     Observations/Objective: Awake, alert and oriented x 3   Review of Systems  Constitutional: Negative for fever, malaise/fatigue and weight loss.  HENT: Negative.  Negative for nosebleeds.   Eyes: Negative.  Negative for blurred vision, double vision and photophobia.  Respiratory: Negative.  Negative for cough and shortness of breath.   Cardiovascular: Positive for leg swelling. Negative for chest pain and palpitations.  Gastrointestinal: Negative.  Negative for heartburn, nausea and vomiting.  Musculoskeletal: Negative.  Negative for myalgias.  Neurological: Negative.  Negative for dizziness, focal weakness, seizures and headaches.  Psychiatric/Behavioral: Negative.  Negative for suicidal ideas.    Assessment and Plan: Cody [name] was seen today for establish care.  Diagnoses and all orders for this visit:  Encounter to establish care  Chronic systolic heart failure (HCC) -     sacubitril-valsartan  (ENTRESTO) 49-51 MG; Take 1 tablet by mouth 2 (two) times daily for 7 days. NO REFILLS NEEDS TO SEE CARDIOLOGY -     isosorbide-hydrALAZINE (BIDIL) 20-37.5 MG tablet; Take 1 tablet by mouth 3 (three) times daily for 7 days. NO REFILLS NEEDS TO SEE CARDIOLOGY.  Persistent atrial fibrillation (HCC) Continue eliquis and follow up with Cardiology   Follow Up Instructions Return in about 3 months (around 12/20/2019).     I discussed the assessment and treatment plan with the patient. The patient was provided an opportunity to ask questions and all were answered. The patient agreed with the plan and demonstrated an understanding of the instructions.   The patient was advised to call back or seek an in-person evaluation if the symptoms worsen or if the condition fails to improve as anticipated.  I provided 17 minutes of non-face-to-face time during this encounter including median intraservice  time, reviewing previous notes, labs, imaging, medications and explaining diagnosis and management.  Gildardo Pounds, FNP-BC

## 2019-09-23 ENCOUNTER — Other Ambulatory Visit: Payer: Self-pay | Admitting: Family Medicine

## 2019-09-23 ENCOUNTER — Encounter: Payer: Self-pay | Admitting: Nurse Practitioner

## 2019-09-26 ENCOUNTER — Other Ambulatory Visit: Payer: Self-pay

## 2019-09-26 MED FILL — FUROSEMIDE 40 MG TAB: 40 | 30 days supply | Qty: 30 | Fill #0

## 2019-09-30 ENCOUNTER — Encounter: Payer: Self-pay | Admitting: Cardiology

## 2019-09-30 ENCOUNTER — Ambulatory Visit: Payer: Medicaid Other | Admitting: Cardiology

## 2019-09-30 ENCOUNTER — Other Ambulatory Visit: Payer: Self-pay

## 2019-09-30 VITALS — BP 148/95 | HR 82 | Temp 97.2°F | Ht 73.0 in | Wt 280.0 lb

## 2019-09-30 DIAGNOSIS — I4819 Other persistent atrial fibrillation: Secondary | ICD-10-CM

## 2019-09-30 DIAGNOSIS — I10 Essential (primary) hypertension: Secondary | ICD-10-CM

## 2019-09-30 DIAGNOSIS — I5022 Chronic systolic (congestive) heart failure: Secondary | ICD-10-CM

## 2019-09-30 DIAGNOSIS — N529 Male erectile dysfunction, unspecified: Secondary | ICD-10-CM

## 2019-09-30 NOTE — Progress Notes (Signed)
Follow up visit  Subjective:   Cody [name], male    DOB: 01-24-1971, 49 y.o.   MRN: [id number]   Chief complaint: Cardiomyopathy  49 y.o. African American male  with hypertension, possible OSA, h/o RLE DVT (2017), persistent Afib, CKD3, HFrEF.  Patient is doing much better. He has no edema, shortness of breath at this time.  He is compliant with his heart failure medical therapy.  However, unfortunately he has not been able to check his blood work due to financial reasons.  Also, he is not taking Eliquis for anticoagulation, perhaps due to misunderstanding of my recommendations at last visit.  Today, his biggest concern is his erectile dysfunction.   Recent echocardiogram findings reviewed with the patient.  Current Outpatient Medications on File Prior to Visit  Medication Sig Dispense Refill  . carvedilol (COREG) 25 MG tablet Take 1 tablet (25 mg total) by mouth 2 (two) times daily with a meal. 180 tablet 3  . furosemide (LASIX) 40 MG tablet TAKE 1 TABLET (40 MG TOTAL) BY MOUTH DAILY. 30 tablet 2  . isosorbide-hydrALAZINE (BIDIL) 20-37.5 MG tablet Take by mouth in the morning and at bedtime.     . naproxen sodium (ALEVE) 220 MG tablet Take 220 mg by mouth daily as needed.    . sacubitril-valsartan (ENTRESTO) 49-51 MG Take 1 tablet by mouth 2 (two) times daily.    . potassium chloride SA (K-DUR) 20 MEQ tablet Take 20 mEq by mouth daily.      No current facility-administered medications on file prior to visit.    Cardiovascular studies:  EKG 09/30/2019: Atrial fibrillation 83 bpm. IVCD. Nonspecific T-abnormality.    Echocardiogram 09/22/2019:  Left ventricle cavity is normal in size. Severe concentric hypertrophy of  the left ventricle. Severe global hypokinesis with severely depressed LV  systolic function with EF 20-25%. Unable to evaluate diastolic function  due to atrial fibrillation.  Left atrial cavity is severely dilated.  Trileaflet aortic valve with no  regurgitation. Trace aortic stenosis.  Moderate (Grade II) mitral regurgitation.  IVC is dilated with respiratory variation. Estimated RA pressure 8 mmHg.  No significant change compared to previous study on 10/29/2018.   EKG 12/07/2018: Atrial fibrillation with controlled ventricular rate Left bundle branch block.   EKG 11/04/2018: Afib w/RVR. IVCD  LE [place] 11/02/2018: Right: There is no evidence of deep vein thrombosis in the lower extremity. No cystic structure found in the popliteal fossa.  Recent labs: 11/2018: Glucose 109, BUN/Cr 25/1.37. EGFR >60. Na/K 140/3.5. Rest of the CMP normal H/H 11.4/38.5. MCV 83. Platelets 386 Results for ADONAI, SELSOR (MRN [id number]) as of 12/07/2018 11:20  Ref. Range 02/20/2016 14:53 10/31/2016 00:15 10/31/2016 03:40 10/29/2018 03:12 10/29/2018 09:18 10/29/2018 15:03 11/04/2018 08:31  Troponin I Latest Ref Range: <0.03 ng/mL 0.20 (HH) 0.10 (HH) 0.14 (HH) 0.09 (HH) 0.07 (HH) 0.06 (HH) 0.08 (HH)    Review of Systems  Cardiovascular: Negative for chest pain, dyspnea on exertion, leg swelling, palpitations and syncope.  Genitourinary:       Erectile dysfunction       Vitals:   09/30/19 1359  BP: (!) 148/95  Pulse: 82  Temp: (!) 97.2 F (36.2 C)  SpO2: 97%    Filed Weights   09/30/19 1359  Weight: 280 lb (127 kg)     Objective:   Physical Exam  Constitutional: He appears well-developed and well-nourished.  Neck: No JVD present.  Cardiovascular: Normal rate, normal heart sounds and intact distal pulses. An irregularly irregular  rhythm present.  No murmur heard. Pulmonary/Chest: Effort normal and breath sounds normal. He has no wheezes. He has no rales.  Musculoskeletal:        General: No edema.  Nursing note and vitals reviewed.          Assessment & Recommendations:   49 y.o. African American male  with hypertension, possible OSA, h/o RLE DVT (2017), persistent Afib, CKD3, HFrEF.  Chronic systolic heart failure (HCC) Clinically  compensated.   He is on guideline directed medical therapy with carvedilol 25 mg twice daily, Entresto 49-51 mg twice daily, BiDil 20-37.5 mg, which she is taking twice daily as opposed to 3 times daily.   Continuation of the above therapy is essential for maintenance of his euvolemic status, despite persistent low EF.  However, it is not safe to refill above medications, especially Entresto, without checking his renal function and potassium level.  Patient has time and again failed to get his blood work checked, due to financial reasons.  I explained to the patient the importance of the same.  Is willing to get his BMP checked next week.  Based on the results, I will refill the above medications.  Persistent atrial fibrillation Continue coreg 25 mg bid.  CHA2DS2VASc score 3. Annual stroke risk 4% Previously, I recommended Eliquis 5 mg twice daily.  Patient has not been taking Eliquis.  Again, initiation of anticoagulation at this stage is contingent upon checking his renal function.  I will prescribe Eliquis after checking his BMP next week.  Erectile dysfunction: At this point, this is patient's biggest concern and medical issue affecting his quality of life.  Etiology for his erectile dysfunction includes is chronic systolic heart failure, as well as ongoing use of beta-blockers.  Without beta-blockers, he will almost certainly revert back to decompensated heart failure.  Use of PDE 5 inhibitors is limited due to ongoing use of BiDil.  I had a long discussion with the patient regarding risks of concurrent use of BiDil and PDE 5 inhibitor.  Patient requested that he would like to use sildenafil at least once a month.  I have explained to the patient that the only way he could use sildenafil is by not using BiDil for at least 3 days while using sildenafil.  Patient verbalized understanding of cautious use of sildenafil.  While this is not optimal from medical management of his heart failure, this probably  remains the only way to treat his erectile dysfunction.  I will prescribe him sildenafil 25 mg 10 tablets, 2 refills.  I will see him back in 2 weeks to uptitrate his heart failure medical therapy, based on his basic metabolic panel.  Essential hypertension Better controlled.  F/u in 2 wks after BMP check  Nigel Mormon, MD [facility] [place] Pager: [phone] Office: [phone] If no answer Cell [phone]

## 2019-10-01 ENCOUNTER — Encounter: Payer: Self-pay | Admitting: Cardiology

## 2019-10-01 MED ORDER — SILDENAFIL CITRATE 25 MG PO TABS: 25.0000 mg | ORAL_TABLET | Freq: Every day | ORAL | 2 refills | Status: DC | PRN

## 2019-10-03 MED FILL — SILDENAFIL 25 MG TABLET: 25 | 30 days supply | Qty: 10 | Fill #0

## 2019-10-04 ENCOUNTER — Other Ambulatory Visit: Payer: Self-pay | Admitting: Nurse Practitioner

## 2019-10-04 MED FILL — BIDIL 20-37.5 MG TABS: 20-37.5 | 30 days supply | Qty: 90 | Fill #0

## 2019-10-13 ENCOUNTER — Ambulatory Visit: Payer: Medicaid Other | Admitting: Cardiology

## 2019-10-24 MED FILL — FUROSEMIDE 40 MG TAB: 40 | 30 days supply | Qty: 30 | Fill #1

## 2019-10-28 ENCOUNTER — Ambulatory Visit: Payer: Medicaid Other | Admitting: Cardiology

## 2019-11-04 ENCOUNTER — Telehealth: Payer: Self-pay

## 2019-11-04 NOTE — Telephone Encounter (Signed)
I am sorry I cannot refill Entresto without labwork, as it is not safe to do so.

## 2019-11-04 NOTE — Telephone Encounter (Signed)
It is not safe to refill Entresto without checking labs

## 2019-11-04 NOTE — Telephone Encounter (Signed)
Informed patient that refill for Entresto or samples can not be given until he has lab work done. Patient says he does not have money for lab work. Please advise.

## 2019-11-04 NOTE — Telephone Encounter (Signed)
Patient informed. 

## 2019-11-04 NOTE — Telephone Encounter (Signed)
I am sorry I cannot refill Entresto without labwork, as it is not safe to do so.

## 2019-11-14 ENCOUNTER — Other Ambulatory Visit (HOSPITAL_COMMUNITY): Payer: Self-pay | Admitting: Cardiology

## 2019-11-14 ENCOUNTER — Other Ambulatory Visit: Payer: Self-pay

## 2019-11-14 ENCOUNTER — Telehealth: Payer: Self-pay

## 2019-11-14 DIAGNOSIS — I5022 Chronic systolic (congestive) heart failure: Secondary | ICD-10-CM

## 2019-11-14 MED ORDER — CARVEDILOL 25 MG PO TABS: 25.0000 mg | ORAL_TABLET | Freq: Two times a day (BID) | ORAL | 0 refills | Status: DC

## 2019-11-14 MED ORDER — ENTRESTO 49-51 MG PO TABS: 1.0000 | ORAL_TABLET | Freq: Two times a day (BID) | ORAL | 0 refills | Status: DC

## 2019-11-14 MED ORDER — FUROSEMIDE 40 MG PO TABS: 40.0000 mg | ORAL_TABLET | Freq: Every day | ORAL | 0 refills | Status: DC

## 2019-11-14 MED ORDER — ISOSORB DINITRATE-HYDRALAZINE 20-37.5 MG PO TABS: 1.0000 | ORAL_TABLET | Freq: Two times a day (BID) | ORAL | 0 refills | Status: DC

## 2019-11-14 MED FILL — BIDIL 20-37.5 MG TABS: 20-37.5 | 30 days supply | Qty: 60 | Fill #0

## 2019-11-14 MED FILL — ENTRESTO 49 MG-51 MG TABLET: 49-51 | 30 days supply | Qty: 60 | Fill #0

## 2019-11-14 MED FILL — FUROSEMIDE 40 MG TAB: 40 | 30 days supply | Qty: 30 | Fill #0

## 2019-11-14 NOTE — Telephone Encounter (Signed)
Patient is requesting refills on medication listed:  [facility] Furosemide  [facility] pharmacy.

## 2019-11-15 LAB — BASIC METABOLIC PANEL
BUN/Creatinine Ratio: 12 (ref 9–20)
BUN: 20 mg/dL (ref 6–24)
CO2: 27 mmol/L (ref 20–29)
Calcium: 9.7 mg/dL (ref 8.7–10.2)
Chloride: 103 mmol/L (ref 96–106)
Creatinine, Ser: 1.65 mg/dL — ABNORMAL HIGH (ref 0.76–1.27)
GFR calc Af Amer: 56 mL/min/{1.73_m2} — ABNORMAL LOW (ref >59)
GFR calc non Af Amer: 48 mL/min/{1.73_m2} — ABNORMAL LOW (ref >59)
Glucose: 83 mg/dL (ref 65–99)
Potassium: 4.2 mmol/L (ref 3.5–5.2)
Sodium: 144 mmol/L (ref 134–144)

## 2019-11-16 ENCOUNTER — Telehealth: Payer: Self-pay

## 2019-11-16 MED ORDER — ENTRESTO 49-51 MG PO TABS: 1.0000 | ORAL_TABLET | Freq: Two times a day (BID) | ORAL | 2 refills | Status: DC

## 2019-11-16 NOTE — Telephone Encounter (Signed)
Spoke to patient that his cardiologist had refilled his medication.  Pt. Understood and had pick them up already.

## 2019-11-16 NOTE — Progress Notes (Signed)
Okay to fill Entresto 49-51 mg bid 60 pills X 2 refills. He can get samples from office, if available.  Thanks MJP

## 2019-11-16 NOTE — Telephone Encounter (Signed)
-----   Message from Elder Negus, MD sent at 11/16/2019  4:43 PM EDT ----- [place] Knock to fill Entresto 49-51 mg bid 60 pills X 2 refills. He can get samples from office, if available.  Thanks MJP

## 2019-12-02 ENCOUNTER — Other Ambulatory Visit: Payer: Self-pay

## 2019-12-02 ENCOUNTER — Telehealth: Payer: Self-pay

## 2019-12-02 MED ORDER — FUROSEMIDE 40 MG PO TABS: 40.0000 mg | ORAL_TABLET | Freq: Every day | ORAL | 0 refills | Status: DC

## 2019-12-02 NOTE — Telephone Encounter (Signed)
Refilled a 30-day supply, patient aware and advised to ask for additional refills at appt.

## 2019-12-02 NOTE — Telephone Encounter (Signed)
Pt called requesting med refill for furosemide. Please call pt to confirm med. [phone]

## 2019-12-05 ENCOUNTER — Other Ambulatory Visit: Payer: Self-pay | Admitting: Cardiology

## 2019-12-05 ENCOUNTER — Encounter: Payer: Self-pay | Admitting: Cardiology

## 2019-12-05 ENCOUNTER — Telehealth: Payer: Self-pay

## 2019-12-05 ENCOUNTER — Other Ambulatory Visit: Payer: Self-pay

## 2019-12-05 ENCOUNTER — Ambulatory Visit: Payer: Medicaid Other | Admitting: Cardiology

## 2019-12-05 VITALS — BP 135/94 | HR 70 | Temp 98.2°F | Resp 15 | Ht 72.0 in | Wt 282.0 lb

## 2019-12-05 DIAGNOSIS — I5022 Chronic systolic (congestive) heart failure: Secondary | ICD-10-CM

## 2019-12-05 DIAGNOSIS — N529 Male erectile dysfunction, unspecified: Secondary | ICD-10-CM

## 2019-12-05 DIAGNOSIS — I10 Essential (primary) hypertension: Secondary | ICD-10-CM

## 2019-12-05 DIAGNOSIS — I4819 Other persistent atrial fibrillation: Secondary | ICD-10-CM

## 2019-12-05 MED ORDER — ENTRESTO 49-51 MG PO TABS: 1.0000 | ORAL_TABLET | Freq: Two times a day (BID) | ORAL | 2 refills | Status: DC

## 2019-12-05 MED ORDER — ISOSORB DINITRATE-HYDRALAZINE 20-37.5 MG PO TABS: 1.0000 | ORAL_TABLET | Freq: Two times a day (BID) | ORAL | 2 refills | Status: DC

## 2019-12-05 MED ORDER — FUROSEMIDE 40 MG PO TABS: 40.0000 mg | ORAL_TABLET | Freq: Every day | ORAL | 2 refills | Status: DC

## 2019-12-05 MED ORDER — CARVEDILOL 25 MG PO TABS: 25.0000 mg | ORAL_TABLET | Freq: Two times a day (BID) | ORAL | 2 refills | Status: DC

## 2019-12-05 MED ORDER — APIXABAN 5 MG PO TABS: 5.0000 mg | ORAL_TABLET | Freq: Two times a day (BID) | ORAL | 2 refills | Status: DC

## 2019-12-05 MED FILL — ELIQUIS 5 MG TABLET: 5 | 30 days supply | Qty: 60 | Fill #0

## 2019-12-05 NOTE — Progress Notes (Signed)
Follow up visit  Subjective:   Cody [name], male    DOB: August 30, 1970, 49 y.o.   MRN: [id number]   Chief complaint: Cardiomyopathy  49 y.o. African American male  with hypertension, possible OSA, h/o RLE DVT (2017), persistent Afib, CKD3, HFrEF.  Patient continues to have exertional dyspnea with more than usual activity. He has been out of lasix, and has leg edema. He reports that he is compliant with medical therapy and diet.   Current Outpatient Medications on File Prior to Visit  Medication Sig Dispense Refill  . carvedilol (COREG) 25 MG tablet Take 1 tablet (25 mg total) by mouth 2 (two) times daily with a meal. 60 tablet 0  . furosemide (LASIX) 40 MG tablet Take 1 tablet (40 mg total) by mouth daily. 30 tablet 0  . isosorbide-hydrALAZINE (BIDIL) 20-37.5 MG tablet Take 1 tablet by mouth in the morning and at bedtime. 60 tablet 0  . naproxen sodium (ALEVE) 220 MG tablet Take 220 mg by mouth daily as needed.    . sacubitril-valsartan (ENTRESTO) 49-51 MG Take 1 tablet by mouth 2 (two) times daily. 60 tablet 2  . sildenafil (VIAGRA) 25 MG tablet Take 1 tablet (25 mg total) by mouth daily as needed for erectile dysfunction. Do not use in conjunction with BiDil.  Patient understands risks of concurrent use and knows to avoid using BiDil for 3 days when using sildenafil. 10 tablet 2  . potassium chloride SA (K-DUR) 20 MEQ tablet Take 20 mEq by mouth daily.      No current facility-administered medications on file prior to visit.    Cardiovascular studies:  EKG 09/30/2019: Atrial fibrillation 83 bpm. IVCD. Nonspecific T-abnormality.    Echocardiogram 09/22/2019:  Left ventricle cavity is normal in size. Severe concentric hypertrophy of  the left ventricle. Severe global hypokinesis with severely depressed LV  systolic function with EF 20-25%. Unable to evaluate diastolic function  due to atrial fibrillation.  Left atrial cavity is severely dilated.  Trileaflet aortic valve with no  regurgitation. Trace aortic stenosis.  Moderate (Grade II) mitral regurgitation.  IVC is dilated with respiratory variation. Estimated RA pressure 8 mmHg.  No significant change compared to previous study on 10/29/2018.   EKG 12/07/2018: Atrial fibrillation with controlled ventricular rate Left bundle branch block.   EKG 11/04/2018: Afib w/RVR. IVCD  LE [place] 11/02/2018: Right: There is no evidence of deep vein thrombosis in the lower extremity. No cystic structure found in the popliteal fossa.  Recent labs: 11/14/2019: Glucose 83, BUN/Cr 20/1.65. EGFR 48. Na/K 148/4.2.   12/14/2018: H/H 9.9/32.1. MCV 81. Platelets 303  Review of Systems  Cardiovascular: Negative for chest pain, dyspnea on exertion, leg swelling, palpitations and syncope.  Genitourinary:       Erectile dysfunction       Vitals:   12/05/19 1401  BP: (!) 142/94  Pulse: 70  Resp: 15  Temp: 98.2 F (36.8 C)  SpO2: 97%     Filed Weights   12/05/19 1401  Weight: 282 lb (127.9 kg)     Objective:   Physical Exam  Constitutional: He appears well-developed and well-nourished.  Neck: No JVD present.  Cardiovascular: Normal rate, normal heart sounds and intact distal pulses. An irregularly irregular rhythm present.  No murmur heard. Pulmonary/Chest: Effort normal and breath sounds normal. He has no wheezes. He has no rales.  Musculoskeletal:        General: No edema.  Nursing note and vitals reviewed.  Assessment & Recommendations:   49 y.o. African American male  with hypertension, possible OSA, h/o RLE DVT (2017), persistent Afib, CKD3, HFrEF.  Chronic systolic heart failure (HCC) Etiology unclear. NYHA class II-III symptoms Mild fluid overload. Refilled lasix at 40 mg daily. Continue carvedilol 25 mg twice daily, Entresto 49-51 mg twice daily, BiDil 20-37.5 mg. F/u in 4 weeks after repeat BMP. Will plan to perform right and left heart cath at that point, subject to renal function. If  renal function is unfavorable, will consider noninvasive ischemia testing first.   Persistent atrial fibrillation Continue coreg 25 mg bid.  CHA2DS2VASc score 3. Annual stroke risk 4% Recommend Eiquis 5 mg bid.   Essential hypertension Fairly well controlled.  Erectile dysfunction: At this point, this is patient's biggest concern and medical issue affecting his quality of life.  Etiology for his erectile dysfunction includes is chronic systolic heart failure, as well as ongoing use of beta-blockers.  Without beta-blockers, he will almost certainly revert back to decompensated heart failure.  Use of PDE 5 inhibitors is limited due to ongoing use of BiDil.  I had a long discussion with the patient regarding risks of concurrent use of BiDil and PDE 5 inhibitor.  Patient requested that he would like to use sildenafil at least once a month.  I have explained to the patient that the only way he could use sildenafil is by not using BiDil for at least 3 days while using sildenafil.  Patient verbalized understanding of cautious use of sildenafil.  While this is not optimal from medical management of his heart failure, this probably remains the only way to treat his erectile dysfunction.  I will prescribe him sildenafil 25 mg 10 tablets, 2 refills.  F/u in 4 weeks.   Nigel Mormon, MD [facility] Pager: [phone] Office: [phone] If no answer Cell [phone]

## 2019-12-05 NOTE — Telephone Encounter (Signed)
I placed a prescription. There should be no reason why pharmacy should not fill it. Also, he needs Eliquis samples from our office.

## 2019-12-05 NOTE — Telephone Encounter (Signed)
Spoke with patient and patient stated his pharmacy will not let him refill his Lasix for five days and he is completely out. Is there anyway we can override this? Please advise. Thanks!

## 2019-12-06 MED FILL — FUROSEMIDE 40 MG TAB: 40 | 30 days supply | Qty: 30 | Fill #0

## 2019-12-16 MED FILL — CARVEDILOL 25 MG TABLET: 25 | 90 days supply | Qty: 180 | Fill #0

## 2019-12-23 MED FILL — BIDIL 20-37.5 MG TABS: 20-37.5 | 90 days supply | Qty: 180 | Fill #0

## 2019-12-23 MED FILL — ENTRESTO 49 MG-51 MG TABLET: 49-51 | 30 days supply | Qty: 60 | Fill #0

## 2019-12-28 NOTE — Progress Notes (Signed)
Follow up visit  Subjective:   Cody [name], male    DOB: 16-Dec-1970, 49 y.o.   MRN: [id number]   Chief complaint: Cardiomyopathy  49 y.o. African American male  with hypertension, possible OSA, h/o RLE DVT (2017), persistent Afib, CKD3, HFrEF.  Patient has had to increase Lasix to 80 mg daily in order to relieve his dyspnea and leg edema.  As result, he has been out of Lasix for last few days.  He has not had any chest pain.  He is compliant with rest of his medical therapy.  Current Outpatient Medications on File Prior to Visit  Medication Sig Dispense Refill  . apixaban (ELIQUIS) 5 MG TABS tablet Take 1 tablet (5 mg total) by mouth 2 (two) times daily. 60 tablet 2  . carvedilol (COREG) 25 MG tablet Take 1 tablet (25 mg total) by mouth 2 (two) times daily with a meal. 180 tablet 2  . furosemide (LASIX) 40 MG tablet Take 1 tablet (40 mg total) by mouth daily. 90 tablet 2  . isosorbide-hydrALAZINE (BIDIL) 20-37.5 MG tablet Take 1 tablet by mouth in the morning and at bedtime. 180 tablet 2  . sacubitril-valsartan (ENTRESTO) 49-51 MG Take 1 tablet by mouth 2 (two) times daily. 180 tablet 2   No current facility-administered medications on file prior to visit.    Cardiovascular studies:  EKG 09/30/2019: Atrial fibrillation 83 bpm. IVCD. Nonspecific T-abnormality.   Echocardiogram 09/22/2019:  Left ventricle cavity is normal in size. Severe concentric hypertrophy of  the left ventricle. Severe global hypokinesis with severely depressed LV  systolic function with EF 20-25%. Unable to evaluate diastolic function  due to atrial fibrillation.  Left atrial cavity is severely dilated.  Trileaflet aortic valve with no regurgitation. Trace aortic stenosis.  Moderate (Grade II) mitral regurgitation.  IVC is dilated with respiratory variation. Estimated RA pressure 8 mmHg.  No significant change compared to previous study on 10/29/2018.   EKG 12/07/2018: Atrial fibrillation with  controlled ventricular rate Left bundle branch block.   EKG 11/04/2018: Afib w/RVR. IVCD  LE [place] 11/02/2018: Right: There is no evidence of deep vein thrombosis in the lower extremity. No cystic structure found in the popliteal fossa.  Recent labs: 11/14/2019: Glucose 83, BUN/Cr 20/1.65. EGFR 48. Na/K 148/4.2.   12/14/2018: H/H 9.9/32.1. MCV 81. Platelets 303  Review of Systems  Cardiovascular: Negative for chest pain, dyspnea on exertion, leg swelling, palpitations and syncope.  Genitourinary:       Erectile dysfunction    Vitals:   12/30/19 1445 12/30/19 1449  BP: (!) 123/91 (!) 123/93  Pulse: 72 77  Resp: 17   Temp: 98.4 F (36.9 C)   SpO2: 97%      Filed Weights   12/30/19 1445  Weight: 283 lb 8 oz (128.6 kg)     Objective:   Physical Exam  Constitutional: He appears well-developed and well-nourished.  Neck: No JVD present.  Cardiovascular: Normal rate, normal heart sounds and intact distal pulses. An irregularly irregular rhythm present.  No murmur heard. Pulmonary/Chest: Effort normal and breath sounds normal. He has no wheezes. He has no rales.  Musculoskeletal:        General: No edema.  Nursing note and vitals reviewed.          Assessment & Recommendations:   49 y.o. African American male  with hypertension, possible OSA, h/o RLE DVT (2017), persistent Afib, CKD3, HFrEF.  Chronic systolic heart failure (HCC) Etiology unclear. NYHA class II-III symptoms Mild  fluid overload. Refilled lasix at 80 mg daily, with additional 40 mg as needed for leg edema.   Continue carvedilol 25 mg twice daily, Entresto 49-51 mg twice daily, BiDil 20-37.5 mg. Unable to use spironolactone or Farxiga due to renal dysfunction. Recommend nuclear stress test for CAD evaluation. Recommend remote patient monitoring for daily weight checks.   Consult regarding low-salt diet.   F/u in 4 weeks  Persistent atrial fibrillation Rate controlled Continue coreg 25 mg bid.    CHA2DS2VASc score 3. Annual stroke risk 4% Continue Eiquis 5 mg bid.   Essential hypertension Fairly well controlled.  Erectile dysfunction: At this point, this is patient's biggest concern and medical issue affecting his quality of life.  Etiology for his erectile dysfunction includes is chronic systolic heart failure, as well as ongoing use of beta-blockers.  Without beta-blockers, he will almost certainly revert back to decompensated heart failure.  Use of PDE 5 inhibitors is limited due to ongoing use of BiDil.  I had a long discussion with the patient regarding risks of concurrent use of BiDil and PDE 5 inhibitor.  Patient requested that he would like to use sildenafil at least once a month.  I have explained to the patient that the only way he could use sildenafil is by not using BiDil for at least 3 days while using sildenafil.  Patient verbalized understanding of cautious use of sildenafil.  While this is not optimal from medical management of his heart failure, this probably remains the only way to treat his erectile dysfunction.  I will prescribe him sildenafil 25 mg 10 tablets, 2 refills.  F/u in 4 weeks.   Manish J Patwardhan, MD [facility] Pager: [phone] Office: [phone] If no answer Cell [phone]   

## 2019-12-30 ENCOUNTER — Encounter: Payer: Self-pay | Admitting: Cardiology

## 2019-12-30 ENCOUNTER — Ambulatory Visit: Payer: Medicaid Other | Admitting: Cardiology

## 2019-12-30 ENCOUNTER — Other Ambulatory Visit: Payer: Self-pay

## 2019-12-30 ENCOUNTER — Telehealth: Payer: Self-pay

## 2019-12-30 ENCOUNTER — Other Ambulatory Visit: Payer: Self-pay | Admitting: Cardiology

## 2019-12-30 VITALS — BP 123/93 | HR 77 | Temp 98.4°F | Resp 17 | Ht 72.0 in | Wt 283.5 lb

## 2019-12-30 DIAGNOSIS — I5022 Chronic systolic (congestive) heart failure: Secondary | ICD-10-CM

## 2019-12-30 DIAGNOSIS — I4819 Other persistent atrial fibrillation: Secondary | ICD-10-CM

## 2019-12-30 DIAGNOSIS — N529 Male erectile dysfunction, unspecified: Secondary | ICD-10-CM

## 2019-12-30 DIAGNOSIS — I10 Essential (primary) hypertension: Secondary | ICD-10-CM

## 2019-12-30 MED ORDER — FUROSEMIDE 40 MG PO TABS: 40.0000 mg | ORAL_TABLET | ORAL | 2 refills | Status: DC

## 2019-12-30 MED ORDER — SILDENAFIL CITRATE 25 MG PO TABS: 25.0000 mg | ORAL_TABLET | Freq: Every day | ORAL | 2 refills | Status: DC | PRN

## 2020-01-04 ENCOUNTER — Other Ambulatory Visit: Payer: Medicaid Other

## 2020-02-03 ENCOUNTER — Ambulatory Visit: Payer: Medicaid Other | Admitting: Cardiology

## 2020-02-03 ENCOUNTER — Other Ambulatory Visit: Payer: Self-pay

## 2020-02-03 ENCOUNTER — Encounter: Payer: Self-pay | Admitting: Cardiology

## 2020-02-03 VITALS — BP 148/89 | HR 86 | Resp 17 | Ht 72.0 in | Wt 278.0 lb

## 2020-02-03 DIAGNOSIS — I5022 Chronic systolic (congestive) heart failure: Secondary | ICD-10-CM

## 2020-02-03 DIAGNOSIS — I4819 Other persistent atrial fibrillation: Secondary | ICD-10-CM

## 2020-02-03 DIAGNOSIS — I10 Essential (primary) hypertension: Secondary | ICD-10-CM

## 2020-02-03 MED ORDER — ENTRESTO 97-103 MG PO TABS: 1.0000 | ORAL_TABLET | Freq: Two times a day (BID) | ORAL | 2 refills | Status: DC

## 2020-02-03 MED FILL — ENTRESTO 97 MG-103 MG TAB: 97-103 | 30 days supply | Qty: 60 | Fill #0

## 2020-02-03 NOTE — Progress Notes (Signed)
Follow up visit  Subjective:   Cody [name], male    DOB: March 22, 1971, 49 y.o.   MRN: [id number]   Chief complaint: Cardiomyopathy  49 y.o. African American male  with hypertension, possible OSA, h/o RLE DVT (2017), persistent Afib, CKD3, HFrEF.  His breathing remains stable at NYHA class III. Weight has been stable. Leg edema has resolved. Blood pressure is elevated today. He did not undergo stress test as recommended.   Current Outpatient Medications on File Prior to Visit  Medication Sig Dispense Refill  . apixaban (ELIQUIS) 5 MG TABS tablet Take 1 tablet (5 mg total) by mouth 2 (two) times daily. 60 tablet 2  . carvedilol (COREG) 25 MG tablet Take 1 tablet (25 mg total) by mouth 2 (two) times daily with a meal. 180 tablet 2  . furosemide (LASIX) 40 MG tablet Take 1 tablet (40 mg total) by mouth as directed. 2 tablets daily, one additional tablet as needed for leg edema 180 tablet 2  . isosorbide-hydrALAZINE (BIDIL) 20-37.5 MG tablet Take 1 tablet by mouth in the morning and at bedtime. 180 tablet 2  . sacubitril-valsartan (ENTRESTO) 49-51 MG Take 1 tablet by mouth 2 (two) times daily. 180 tablet 2  . sildenafil (VIAGRA) 25 MG tablet Take 1 tablet (25 mg total) by mouth daily as needed for erectile dysfunction. 10 tablet 2   No current facility-administered medications on file prior to visit.    Cardiovascular studies:  EKG 09/30/2019: Atrial fibrillation 83 bpm. IVCD. Nonspecific T-abnormality.   Echocardiogram 09/22/2019:  Left ventricle cavity is normal in size. Severe concentric hypertrophy of  the left ventricle. Severe global hypokinesis with severely depressed LV  systolic function with EF 20-25%. Unable to evaluate diastolic function  due to atrial fibrillation.  Left atrial cavity is severely dilated.  Trileaflet aortic valve with no regurgitation. Trace aortic stenosis.  Moderate (Grade II) mitral regurgitation.  IVC is dilated with respiratory variation. Estimated  RA pressure 8 mmHg.  No significant change compared to previous study on 10/29/2018.   EKG 12/07/2018: Atrial fibrillation with controlled ventricular rate Left bundle branch block.   EKG 11/04/2018: Afib w/RVR. IVCD  LE [place] 11/02/2018: Right: There is no evidence of deep vein thrombosis in the lower extremity. No cystic structure found in the popliteal fossa.  Recent labs: 11/14/2019: Glucose 83, BUN/Cr 20/1.65. EGFR 48. Na/K 148/4.2.   12/14/2018: H/H 9.9/32.1. MCV 81. Platelets 303  Review of Systems  Cardiovascular: Negative for chest pain, dyspnea on exertion, leg swelling, palpitations and syncope.  Genitourinary:       Erectile dysfunction    Vitals:   02/03/20 1033 02/03/20 1036  BP: (!) 155/92 (!) 148/89  Pulse: 76 86  Resp: 17   SpO2: 97%      Filed Weights   02/03/20 1033  Weight: 278 lb (126.1 kg)     Objective:   Physical Exam Vitals and nursing note reviewed.  Constitutional:      Appearance: He is well-developed.  Neck:     Vascular: No JVD.  Cardiovascular:     Rate and Rhythm: Normal rate. Rhythm irregularly irregular.     Pulses: Intact distal pulses.     Heart sounds: Normal heart sounds. No murmur heard.   Pulmonary:     Effort: Pulmonary effort is normal.     Breath sounds: Normal breath sounds. No wheezing or rales.            Assessment & Recommendations:   49 y.o. African  American male  with hypertension, possible OSA, h/o RLE DVT (2017), persistent Afib, CKD3, HFrEF.  Chronic systolic heart failure (HCC) Etiology unclear. NYHA class II-III symptoms Currently on lasix at 80 mg daily, with additional 40 mg as needed for leg edema.   Currently on carvedilol 25 mg twice daily, Entresto 49-51 mg twice daily, BiDil 20-37.5 mg. Increase Entresto to 97-103 mg bid. Check BMP, BNP in 1 week Unable to use spironolactone or Farxiga due to renal dysfunction. Stress test pending Recommend remote patient monitoring for blood pressure  checks.   Consult regarding low-salt diet.    Persistent atrial fibrillation Rate controlled Continue coreg 25 mg bid.  CHA2DS2VASc score 3. Annual stroke risk 4% Continue Eiquis 5 mg bid.   Essential hypertension Uncontrolled. See above.  Erectile dysfunction: At this point, this is patient's biggest concern and medical issue affecting his quality of life.  Etiology for his erectile dysfunction includes is chronic systolic heart failure, as well as ongoing use of beta-blockers.  Without beta-blockers, he will almost certainly revert back to decompensated heart failure.  Use of PDE 5 inhibitors is limited due to ongoing use of BiDil.  I had a long discussion with the patient regarding risks of concurrent use of BiDil and PDE 5 inhibitor.  Patient requested that he would like to use sildenafil at least once a month.  I have explained to the patient that the only way he could use sildenafil is by not using BiDil for at least 3 days while using sildenafil.  Patient verbalized understanding of cautious use of sildenafil.  While this is not optimal from medical management of his heart failure, this probably remains the only way to treat his erectile dysfunction.  I   F/u in 4 weeks.   Nigel Mormon, MD [facility] Pager: [phone] Office: [phone] If no answer Cell [phone]

## 2020-02-29 ENCOUNTER — Other Ambulatory Visit: Payer: Medicaid Other

## 2020-03-02 ENCOUNTER — Emergency Department (HOSPITAL_COMMUNITY): Payer: Medicaid Other

## 2020-03-02 ENCOUNTER — Emergency Department (HOSPITAL_COMMUNITY)
Admission: EM | Admit: 2020-03-02 | Discharge: 2020-03-02 | Disposition: A | Discharge status: Home / Self Care | Payer: Medicaid Other | Attending: Emergency Medicine | Admitting: Emergency Medicine

## 2020-03-02 ENCOUNTER — Encounter (HOSPITAL_COMMUNITY): Payer: Self-pay | Admitting: Emergency Medicine

## 2020-03-02 ENCOUNTER — Other Ambulatory Visit: Payer: Self-pay

## 2020-03-02 ENCOUNTER — Ambulatory Visit: Payer: Medicaid Other | Admitting: Cardiology

## 2020-03-02 DIAGNOSIS — I5022 Chronic systolic (congestive) heart failure: Secondary | ICD-10-CM | POA: Insufficient documentation

## 2020-03-02 DIAGNOSIS — M79641 Pain in right hand: Secondary | ICD-10-CM | POA: Diagnosis present

## 2020-03-02 DIAGNOSIS — Z7952 Long term (current) use of systemic steroids: Secondary | ICD-10-CM | POA: Insufficient documentation

## 2020-03-02 DIAGNOSIS — Z86718 Personal history of other venous thrombosis and embolism: Secondary | ICD-10-CM | POA: Insufficient documentation

## 2020-03-02 DIAGNOSIS — Z7901 Long term (current) use of anticoagulants: Secondary | ICD-10-CM | POA: Diagnosis not present

## 2020-03-02 DIAGNOSIS — N183 Chronic kidney disease, stage 3 unspecified: Secondary | ICD-10-CM | POA: Insufficient documentation

## 2020-03-02 DIAGNOSIS — I4891 Unspecified atrial fibrillation: Secondary | ICD-10-CM | POA: Insufficient documentation

## 2020-03-02 DIAGNOSIS — M10041 Idiopathic gout, right hand: Secondary | ICD-10-CM | POA: Insufficient documentation

## 2020-03-02 DIAGNOSIS — I130 Hypertensive heart and chronic kidney disease with heart failure and stage 1 through stage 4 chronic kidney disease, or unspecified chronic kidney disease: Secondary | ICD-10-CM | POA: Diagnosis not present

## 2020-03-02 DIAGNOSIS — M109 Gout, unspecified: Secondary | ICD-10-CM

## 2020-03-02 MED ORDER — PREDNISONE 20 MG PO TABS
60.0000 mg | ORAL_TABLET | Freq: Once | ORAL | Status: AC
  Administered 2020-03-02: 60 mg via ORAL
  Filled 2020-03-02: qty 3

## 2020-03-02 MED ORDER — HYDROCODONE-ACETAMINOPHEN 5-325 MG PO TABS: 1.0000 | ORAL_TABLET | Freq: Four times a day (QID) | ORAL | 0 refills | Status: DC | PRN

## 2020-03-02 MED ORDER — OXYCODONE-ACETAMINOPHEN 5-325 MG PO TABS
2.0000 | ORAL_TABLET | Freq: Once | ORAL | Status: AC
  Administered 2020-03-02: 2 via ORAL
  Filled 2020-03-02: qty 2

## 2020-03-02 MED ORDER — PREDNISONE 20 MG PO TABS
ORAL_TABLET | ORAL | 0 refills | Status: DC
Start: 2020-03-02 — End: 2020-03-15

## 2020-03-02 MED FILL — predniSONE 20 MG TABS: 20 | 5 days supply | Qty: 11 | Fill #0

## 2020-03-02 NOTE — Discharge Instructions (Addendum)
1.  Your symptoms are likely due to gout.  See the attached information regarding gout in recommendations for your diet to avoid gout episodes. 2.  Make an appointment for recheck with your family doctor in the next 3 to 5 days. 3.  Return to the emergency department if you are getting increasing pain, redness, fever, numbness or difficulty using your hand.

## 2020-03-02 NOTE — ED Triage Notes (Signed)
Pt reports generalized R hand pain and swelling x5 days, endorses tingling to wrist as well. Denies any known trauma or injury, denies any repetitive use of hand. No redness present.

## 2020-03-02 NOTE — ED Provider Notes (Signed)
[facility] EMERGENCY DEPARTMENT Provider Note   CSN: [id number] Arrival date & time: 03/02/20  0522     History Chief Complaint  Patient presents with  . Hand Pain    Cody [name] is a 49 y.o. male.  HPI Patient reports he has had painful swelling of his right hand for 5 days.  Initially he had some discomfort in his right elbow and so he thought it was arthritis.  He reports the pain however has migrated into his right wrist and hand around the knuckles.  He reports it is now become somewhat swollen and is very painful.  Very painful for movements.  No numbness tingling or pain into the fingers themselves.  No injury.  No fever no chills.  Patient has not tried anything for pain relief.    Past Medical History:  Diagnosis Date  . Atrial fibrillation (HCC)   . CKD (chronic kidney disease), stage III    [name] 02/20/2016  . DVT (deep venous thrombosis) (HCC) 01/2016   a. RLE - Dx 01/2016 in [name] - Took Xarelto for 30 days - out x 2 wks.  [name]   . Hypertension   . Hypertensive heart disease with CHF (congestive heart failure) [facility])     Patient Active Problem List   Diagnosis Date Noted  . Erectile dysfunction 12/05/2019  . Acute pain of right knee 12/08/2018  . Atrial fibrillation with RVR (HCC) 10/29/2018  . Hypertensive urgency 10/29/2018  . Persistent atrial fibrillation (HCC) 10/29/2018  . Chronic atrial fibrillation (HCC) 08/02/2018  . History of DVT (deep vein thrombosis) 07/27/2018  . Anemia due to stage 3 chronic kidney disease 07/27/2018  . Heme positive stool 07/27/2018  . Essential hypertension 07/19/2018  . Chronic systolic heart failure (HCC) 07/17/2018  . Obstructive sleep apnea 12/11/2016  . H/O noncompliance with medical treatment, presenting hazards to health   . Elevated troponin 02/20/2016  . CKD (chronic kidney disease) stage 3, GFR 30-59 ml/min (HCC) 02/20/2016    Past Surgical History:  Procedure Laterality Date  . NO  PAST SURGERIES         Family History  Problem Relation Age of Onset  . Asthma Mother        alive and well  . Hypertension Father        alive  . Lung cancer Father   . Cancer Father     Social History   Tobacco Use  . Smoking status: Never Smoker  . Smokeless tobacco: Never Used  Vaping Use  . Vaping Use: Never used  Substance Use Topics  . Alcohol use: No  . Drug use: No    Home Medications Prior to Admission medications   Medication Sig Start Date End Date Taking? Authorizing Provider  apixaban (ELIQUIS) 5 MG TABS tablet Take 1 tablet (5 mg total) by mouth 2 (two) times daily. 12/05/19  Yes Patwardhan, Anabel Bene, MD  carvedilol (COREG) 25 MG tablet Take 1 tablet (25 mg total) by mouth 2 (two) times daily with a meal. 12/05/19  Yes Patwardhan, Manish J, MD  furosemide (LASIX) 40 MG tablet Take 1 tablet (40 mg total) by mouth as directed. 2 tablets daily, one additional tablet as needed for leg edema 12/30/19  Yes Patwardhan, Manish J, MD  ibuprofen (ADVIL) 200 MG tablet Take 200 mg by mouth every 6 (six) hours as needed for headache.   Yes [provider]  isosorbide-hydrALAZINE (BIDIL) 20-37.5 MG tablet Take 1 tablet by mouth  in the morning and at bedtime. 12/05/19  Yes Patwardhan, Manish J, MD  sacubitril-valsartan (ENTRESTO) 97-103 MG Take 1 tablet by mouth 2 (two) times daily. 02/03/20  Yes Patwardhan, Anabel Bene, MD  HYDROcodone-acetaminophen (NORCO/VICODIN) 5-325 MG tablet Take 1-2 tablets by mouth every 6 (six) hours as needed for moderate pain. 03/02/20   Arby Barrette, MD  predniSONE (DELTASONE) 20 MG tablet 3 tabs po day one, then 2 po daily x 4 days 03/02/20   Arby Barrette, MD  sildenafil (VIAGRA) 25 MG tablet Take 1 tablet (25 mg total) by mouth daily as needed for erectile dysfunction. Patient not taking: Reported on 03/02/2020 12/30/19   Elder Negus, MD    Allergies    Patient has no known allergies.  Review of Systems   Review of  Systems Constitutional: No fevers no chills no malaise Respiratory: No shortness of breath, no cough Cardiovascular no chest pain Physical Exam Updated Vital Signs BP (!) 146/92 (BP Location: Left Arm)   Pulse 89   Temp 98.4 F (36.9 C) (Oral)   Resp (!) 22   SpO2 98%   Physical Exam Constitutional:      Comments: Alert nontoxic well in appearance.  No respiratory distress.  Cardiovascular:     Rate and Rhythm: Normal rate and regular rhythm.  Pulmonary:     Effort: Pulmonary effort is normal.     Breath sounds: Normal breath sounds.  Musculoskeletal:     Comments: Lower extremities no peripheral edema.  Calves soft and nontender.  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  This is nontender and no erythema.  No joint effusion at the elbow on the right.  Forearms are bilaterally soft and pliable without soft tissue induration or swelling.  Patient has mild swelling over the right wrist and dorsum of the hand.  Radial pulses are 2+ and symmetric.  Painful to palpation over the dorsum and volar aspect of the wrist.  Pain with range of motion of the wrist.  Palmar surface minimally tender to palpation.  Dorsal surface of the hand over the metacarpals tender to palpation.  No pain for range of motion of the digits distally.  Fingers are warm and dry with brisk cap refill.  Skin:    General: Skin is warm and dry.  Neurological:     General: No focal deficit present.     Mental Status: He is oriented to person, place, and time.     Coordination: Coordination normal.  Psychiatric:        Mood and Affect: Mood normal.     ED Results / Procedures / Treatments   Labs (all labs ordered are listed, but only abnormal results are displayed) Labs Reviewed - No data to display  EKG None  Radiology DG Hand Complete Right  Result Date: 03/02/2020 CLINICAL DATA:  49 year old male with history of right-sided hand pain and swelling for the past 5 days. No history  of trauma. EXAM: RIGHT HAND - COMPLETE 3+ VIEW COMPARISON:  No priors. FINDINGS: There is no evidence of fracture or dislocation. Mild multifocal joint space narrowing, subchondral sclerosis and osteophyte formation, most evident in the D IP and PIP joints, indicative of osteoarthritis. Soft tissues are unremarkable. IMPRESSION: 1. No acute radiographic abnormality of the right hand. 2. Mild osteoarthritis, as above. Electronically Signed   By: Trudie Reed M.D.   On: 03/02/2020 06:10    Procedures Procedures (including critical care time)  Medications Ordered in ED Medications  predniSONE (DELTASONE) tablet 60 mg (has no administration in time range)  oxyCODONE-acetaminophen (PERCOCET/ROXICET) 5-325 MG per tablet 2 tablet (has no administration in time range)    ED Course  I have reviewed the triage vital signs and the nursing notes.  Pertinent labs & imaging results that were available during my care of the patient were reviewed by me and considered in my medical decision making (see chart for details).    MDM Rules/Calculators/A&P                          Review of EMR indicates patient has had positive joint aspirate in April 2020 with urate crystals.  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  Currently this is nontender and not inflamed.  No injury, patient has pain and swelling of the right wrist and metacarpals.  Findings consistent with gout.  Patient is nontoxic and otherwise well.  He does not have any numbness or tingling into the fingers.  There is brisk cap refill to the fingers.  Good radial pulse.  With no injury and pain most concentrated in the joints of the wrist and metacarpal phalangeal joints of the hand, will treat for gout with prednisone and pain control with hydrocodone.  Patient does history tree of hypertension and kidney disease.  Will avoid colchicine or high dose NSAIDs at this time.  Return precautions reviewed. Final Clinical Impression(s) /  ED Diagnoses Final diagnoses:  Acute gout of right hand, unspecified cause    Rx / DC Orders ED Discharge Orders         Ordered    predniSONE (DELTASONE) 20 MG tablet     Discontinue  Reprint     03/02/20 0756    HYDROcodone-acetaminophen (NORCO/VICODIN) 5-325 MG tablet  Every 6 hours PRN     Discontinue  Reprint     03/02/20 0756           Arby Barrette, MD 03/02/20 4401

## 2020-03-08 MED FILL — BIDIL 20-37.5 MG TABS: 20-37.5 | 90 days supply | Qty: 180 | Fill #1

## 2020-03-08 MED FILL — FUROSEMIDE 40 MG TAB: 40 | 60 days supply | Qty: 180 | Fill #1

## 2020-03-08 MED FILL — CARVEDILOL 25 MG TABLET: 25 | 90 days supply | Qty: 180 | Fill #1

## 2020-03-08 MED FILL — ENTRESTO 97 MG-103 MG TAB: 97-103 | 30 days supply | Qty: 60 | Fill #1

## 2020-03-11 ENCOUNTER — Other Ambulatory Visit: Payer: Self-pay

## 2020-03-11 ENCOUNTER — Emergency Department (HOSPITAL_COMMUNITY)
Admission: EM | Admit: 2020-03-11 | Discharge: 2020-03-11 | Disposition: A | Discharge status: Home / Self Care | Payer: Medicaid Other | Attending: Emergency Medicine | Admitting: Emergency Medicine

## 2020-03-11 ENCOUNTER — Encounter (HOSPITAL_COMMUNITY): Payer: Self-pay | Admitting: Emergency Medicine

## 2020-03-11 DIAGNOSIS — M79642 Pain in left hand: Secondary | ICD-10-CM | POA: Diagnosis not present

## 2020-03-11 DIAGNOSIS — M109 Gout, unspecified: Secondary | ICD-10-CM | POA: Diagnosis not present

## 2020-03-11 DIAGNOSIS — M79641 Pain in right hand: Secondary | ICD-10-CM | POA: Diagnosis not present

## 2020-03-11 DIAGNOSIS — Z5321 Procedure and treatment not carried out due to patient leaving prior to being seen by health care provider: Secondary | ICD-10-CM | POA: Insufficient documentation

## 2020-03-11 MED ORDER — OXYCODONE-ACETAMINOPHEN 5-325 MG PO TABS
1.0000 | ORAL_TABLET | ORAL | Status: DC | PRN
  Filled 2020-03-11: qty 1

## 2020-03-11 NOTE — ED Notes (Signed)
Pt stated he was leaving. 

## 2020-03-11 NOTE — ED Triage Notes (Signed)
Pt presents to ED POV. Pt c/o bilateral hand pain. Pt reports he has gout in both hands. NAD

## 2020-03-14 ENCOUNTER — Telehealth: Payer: Self-pay | Admitting: Critical Care Medicine

## 2020-03-14 NOTE — Telephone Encounter (Signed)
Dr. Laural Benes -   Forwarding this to you as you are scheduled to see this patient tomorrow. Medicaid will not cover Viagra. They will cover generic sildenafil 20 mg tabs.

## 2020-03-14 NOTE — Telephone Encounter (Signed)
Medication Refill - Medication: sildenafil (VIAGRA) 25 MG tablet    Preferred Pharmacy (with phone number or street name):  [facility] - [place], [place] - [address] Phone:  [phone]  Fax:  [phone]       Agent: Please be advised that RX refills may take up to 3 business days. We ask that you follow-up with your pharmacy.

## 2020-03-15 ENCOUNTER — Ambulatory Visit: Payer: Medicaid Other | Attending: Internal Medicine | Admitting: Family

## 2020-03-15 ENCOUNTER — Other Ambulatory Visit: Payer: Self-pay

## 2020-03-15 DIAGNOSIS — M109 Gout, unspecified: Secondary | ICD-10-CM

## 2020-03-15 DIAGNOSIS — Z09 Encounter for follow-up examination after completed treatment for conditions other than malignant neoplasm: Secondary | ICD-10-CM | POA: Diagnosis not present

## 2020-03-15 MED ORDER — PREDNISONE 10 MG PO TABS: ORAL_TABLET | ORAL | 0 refills | Status: AC

## 2020-03-15 MED ORDER — COLCHICINE 0.6 MG PO TABS: 0.6000 mg | ORAL_TABLET | Freq: Every day | ORAL | 2 refills | Status: DC

## 2020-03-15 MED FILL — predniSONE 10 MG TABS: 10 | 9 days supply | Qty: 18 | Fill #0

## 2020-03-15 NOTE — Progress Notes (Signed)
Pt states he is not able to get the swelling to go down. Pt states the swelling is in his right hand   Medicaid will not cover Viagra. They will cover generic sildenafil 20 mg tabs(will need new rx)

## 2020-03-15 NOTE — Patient Instructions (Signed)
Prednisone taper and Colchicine for gout. Follow-up with primary physician as needed. Gout  Gout is painful swelling of your joints. Gout is a type of arthritis. It is caused by having too much uric acid in your body. Uric acid is a chemical that is made when your body breaks down substances called purines. If your body has too much uric acid, sharp crystals can form and build up in your joints. This causes pain and swelling. Gout attacks can happen quickly and be very painful (acute gout). Over time, the attacks can affect more joints and happen more often (chronic gout). What are the causes?  Too much uric acid in your blood. This can happen because: ? Your kidneys do not remove enough uric acid from your blood. ? Your body makes too much uric acid. ? You eat too many foods that are high in purines. These foods include organ meats, some seafood, and beer.  Trauma or stress. What increases the risk?  Having a family history of gout.  Being male and middle-aged.  Being male and having gone through menopause.  Being very overweight (obese).  Drinking alcohol, especially beer.  Not having enough water in the body (being dehydrated).  Losing weight too quickly.  Having an organ transplant.  Having lead poisoning.  Taking certain medicines.  Having kidney disease.  Having a skin condition called psoriasis. What are the signs or symptoms? An attack of acute gout usually happens in just one joint. The most common place is the big toe. Attacks often start at night. Other joints that may be affected include joints of the feet, ankle, knee, fingers, wrist, or elbow. Symptoms of an attack may include:  Very bad pain.  Warmth.  Swelling.  Stiffness.  Shiny, red, or purple skin.  Tenderness. The affected joint may be very painful to touch.  Chills and fever. Chronic gout may cause symptoms more often. More joints may be involved. You may also have white or yellow lumps  (tophi) on your hands or feet or in other areas near your joints. How is this treated?  Treatment for this condition has two phases: treating an acute attack and preventing future attacks.  Acute gout treatment may include: ? NSAIDs. ? Steroids. These are taken by mouth or injected into a joint. ? Colchicine. This medicine relieves pain and swelling. It can be given by mouth or through an IV tube.  Preventive treatment may include: ? Taking small doses of NSAIDs or colchicine daily. ? Using a medicine that reduces uric acid levels in your blood. ? Making changes to your diet. You may need to see a food expert (dietitian) about what to eat and drink to prevent gout. Follow these instructions at home: During a gout attack   If told, put ice on the painful area: ? Put ice in a plastic bag. ? Place a towel between your skin and the bag. ? Leave the ice on for 20 minutes, 2-3 times a day.  Raise (elevate) the painful joint above the level of your heart as often as you can.  Rest the joint as much as possible. If the joint is in your leg, you may be given crutches.  Follow instructions from your doctor about what you cannot eat or drink. Avoiding future gout attacks  Eat a low-purine diet. Avoid foods and drinks such as: ? Liver. ? Kidney. ? Anchovies. ? Asparagus. ? Herring. ? Mushrooms. ? Mussels. ? Beer.  Stay at a healthy weight. If you  want to lose weight, talk with your doctor. Do not lose weight too fast.  Start or continue an exercise plan as told by your doctor. Eating and drinking  Drink enough fluids to keep your pee (urine) pale yellow.  If you drink alcohol: ? Limit how much you use to:  0-1 drink a day for women.  0-2 drinks a day for men. ? Be aware of how much alcohol is in your drink. In the [place] one drink equals one 12 oz bottle of beer (355 mL), one 5 oz glass of wine (148 mL), or one 1 oz glass of hard liquor (44 mL). General instructions  Take  over-the-counter and prescription medicines only as told by your doctor.  Do not drive or use heavy machinery while taking prescription pain medicine.  Return to your normal activities as told by your doctor. Ask your doctor what activities are safe for you.  Keep all follow-up visits as told by your doctor. This is important. Contact a doctor if:  You have another gout attack.  You still have symptoms of a gout attack after 10 days of treatment.  You have problems (side effects) because of your medicines.  You have chills or a fever.  You have burning pain when you pee (urinate).  You have pain in your lower back or belly. Get help right away if:  You have very bad pain.  Your pain cannot be controlled.  You cannot pee. Summary  Gout is painful swelling of the joints.  The most common site of pain is the big toe, but it can affect other joints.  Medicines and avoiding some foods can help to prevent and treat gout attacks. This information is not intended to replace advice given to you by your health care provider. Make sure you discuss any questions you have with your health care provider. Document Revised: 02/24/2018 Document Reviewed: 02/24/2018 Elsevier Patient Education  2020 [employer].

## 2020-03-15 NOTE — Progress Notes (Signed)
Virtual Visit via Telephone Note  I connected with Cody [name], on 03/15/2020 at 10:10 AM by telephone due to the COVID-19 pandemic and verified that I am speaking with the correct person using two identifiers.  Due to current restrictions/limitations of in-office visits due to the COVID-19 pandemic, this scheduled clinical appointment was converted to a telehealth visit.   Consent: I discussed the limitations, risks, security and privacy concerns of performing an evaluation and management service by telephone and the availability of in person appointments. I also discussed with the patient that there may be a patient responsible charge related to this service. The patient expressed understanding and agreed to proceed.  Location of Patient: Home  Location of Provider: [facility]  Persons participating in Telemedicine visit: Cody RUBI Cody Stabs, NP Cody [name], CMA  History of Present Illness: Cody [name] is a 49 year old male with history of chronic systolic heart failure, essential hypertension, chronic atrial fibrillation, obstructive sleep apnea, CKD stage 3, history of DVT, and erectile dysfunction who presents for hospital follow-up.  1. ER FOLLOW UP: Time since discharge: 13 days Hospital/facility: [facility] Emergency Department Diagnosis: acute gout of right hand Procedures/tests: x-ray right hand Consultants: none New medications: Prednisone, Hydrocodone-Acetaminophen Discharge instructions: follow-up with PCP   Status: worse reports taking Ibuprofen which helps with pain  2. GOUT FOLLOW-UP: Last visit 03/02/2020 with Dr. Donnald Garre. During that encounter patient's history with positive joint aspirate in April 2020 with urate crystals. Physical exam consistent with tophi on the right elbow, non-tender and not inflamed. Findings consistent with gout. Patient non-toxic and otherwise well. No injury and pain most concentrated  in the joints of the wrist and metacarpal phalangeal joints of the hand. Patient treated with Prednisone and pain control with Hydrocodone. Patient with history of hypertension and kidney disease. Colchicine and high dose NSAID's avoided at that time.   Duration:weeks Severity: 6/10  Quality:  Swelling: yes Redness: yes Fevers: no Nausea/vomiting: no Aggravating factors: using right hand Alleviating factors: Ibuprofen for pain Status:  worse Today reports right elbow no longer swollen or painful. Reports the flare tends to fluctuate between right hand and right elbow.  Past Medical History:  Diagnosis Date  . Atrial fibrillation (HCC)   . CKD (chronic kidney disease), stage III    Cody [name] 02/20/2016  . DVT (deep venous thrombosis) (HCC) 01/2016   a. RLE - Dx 01/2016 in Cody [name] - Took Xarelto for 30 days - out x 2 wks.  [name]   . Hypertension   . Hypertensive heart disease with CHF (congestive heart failure) (HCC)    No Known Allergies  Current Outpatient Medications on File Prior to Visit  Medication Sig Dispense Refill  . apixaban (ELIQUIS) 5 MG TABS tablet Take 1 tablet (5 mg total) by mouth 2 (two) times daily. 60 tablet 2  . carvedilol (COREG) 25 MG tablet Take 1 tablet (25 mg total) by mouth 2 (two) times daily with a meal. 180 tablet 2  . furosemide (LASIX) 40 MG tablet Take 1 tablet (40 mg total) by mouth as directed. 2 tablets daily, one additional tablet as needed for leg edema 180 tablet 2  . HYDROcodone-acetaminophen (NORCO/VICODIN) 5-325 MG tablet Take 1-2 tablets by mouth every 6 (six) hours as needed for moderate pain. 20 tablet 0  . HYDROcodone-acetaminophen (NORCO/VICODIN) 5-325 MG tablet Take 1-2 tablets by mouth every 6 (six) hours as needed for moderate pain. 20 tablet 0  . ibuprofen (ADVIL)  200 MG tablet Take 200 mg by mouth every 6 (six) hours as needed for headache.    . isosorbide-hydrALAZINE (BIDIL) 20-37.5 MG tablet Take 1 tablet by mouth in the morning  and at bedtime. 180 tablet 2  . predniSONE (DELTASONE) 20 MG tablet 3 tabs po day one, then 2 po daily x 4 days 11 tablet 0  . sacubitril-valsartan (ENTRESTO) 97-103 MG Take 1 tablet by mouth 2 (two) times daily. 60 tablet 2  . sildenafil (VIAGRA) 25 MG tablet Take 1 tablet (25 mg total) by mouth daily as needed for erectile dysfunction. (Patient not taking: Reported on 03/02/2020) 10 tablet 2   No current facility-administered medications on file prior to visit.    Observations/Objective: Alert and oriented x 3. Not in acute distress. Physical examination not completed as this is a telemedicine visit.  Assessment and Plan: 1. Hospital discharge follow-up: 2. Acute gout of right hand, unspecified cause: -Last visit 03/02/2020 at the [facility] emergency department. During that encounter patient with gout of the right hand. Patient discharged home with Prednisone taper and Hydrocodone for pain management.  -Today patient reports right hand is worse. Reports he did take all of the prescribed Prednisone and Hydrocodone. -Reports currently taking over-the-counter Ibuprofen which helps with pain. -Will try additional course of Prednisone taper for inflammation and swelling related to gout.  -Colchicine prescribed for treatment and prophylaxis of gout attacks. Counseled patient to begin taking this after completion of the Prednisone taper.  -Patient was initially not prescribed Colchicine in the hospital setting because of his history of hypertension and chronic kidney disease. Patient's last GFR on 11/14/2019 was 56 mL/min. In review renal impairment dose adjustment is not indicated for patients current GFR.  -Follow-up with primary physician as needed.  - predniSONE (DELTASONE) 10 MG tablet; Take 3 tablets (30 mg total) by mouth daily for 3 days, THEN 2 tablets (20 mg total) daily for 3 days, THEN 1 tablet (10 mg total) daily for 3 days.  Dispense: 18 tablet; Refill: 0 - colchicine 0.6 MG  tablet; Take 1 tablet (0.6 mg total) by mouth daily.  Dispense: 30 tablet; Refill: 2 -Gout diet: What's allowed, what's not Starting a gout diet? Understand which foods are OK and which to avoid.   By [facility] Staff . Gout is a painful form of arthritis that occurs when high levels of uric acid in the blood cause crystals to form and accumulate in and around a joint. . Uric acid is produced when the body breaks down a chemical called purine. Purine occurs naturally in your body, but it's also found in certain foods. Uric acid is eliminated from the body in urine. . A gout diet may help decrease uric acid levels in the blood. A gout diet isn't a cure. But it may lower the risk of recurring gout attacks and slow the progression of joint damage. [name] People with gout who follow a gout diet generally still need medication to manage pain and to lower levels of uric acid.   Gout diet goals A gout diet is designed to help you: . Achieve a healthy weight and good eating habits . Avoid some, but not all, foods with purines . Include some foods that can control uric acid levels . A good rule of thumb is to eat moderate portions of healthy foods.   Diet details The general principles of a gout diet follow typical healthy-diet recommendations: . Weight loss. Being overweight increases the risk of developing  gout, and losing weight lowers the risk of gout. Research suggests that reducing the number of calories and losing weight -- even without a purine-restricted diet -- lower uric acid levels and reduce the number of gout attacks. Losing weight also lessens the overall stress on joints. . Complex carbs. Eat more fruits, vegetables and whole grains, which provide complex carbohydrates. Avoid foods and beverages with high-fructose corn syrup, and limit consumption of naturally sweet fruit juices. . Water. Stay well-hydrated by drinking water. . Fats. Cut back on saturated fats from red meat, fatty poultry  and high-fat dairy products. . Proteins. Focus on lean meat and poultry, low-fat dairy and lentils as sources of protein. [name] Recommendations for specific foods or supplements include: o Organ and glandular meats. Avoid meats such as liver, kidney and sweetbreads, which have high purine levels and contribute to high blood levels of uric acid. o Red meat. Limit serving sizes of beef, lamb and pork. o Seafood. Some types of seafood -- such as anchovies, shellfish, sardines and tuna -- are higher in purines than are other types. But the overall health benefits of eating fish may outweigh the risks for people with gout. Moderate portions of fish can be part of a gout diet. o High-purine vegetables. Studies have shown that vegetables high in purines, such as asparagus and spinach, don't increase the risk of gout or recurring gout attacks. o Alcohol. Beer and distilled liquors are associated with an increased risk of gout and recurring attacks. Moderate consumption of wine doesn't appear to increase the risk of gout attacks. Avoid alcohol during gout attacks, and limit alcohol, especially beer, between attacks. o Sugary foods and beverages. Limit or avoid sugar-sweetened foods such as sweetened cereals, bakery goods and candies. Limit consumption of naturally sweet fruit juices. o Vitamin C. Vitamin C may help lower uric acid levels. Talk to your doctor about whether a 500-milligram vitamin C supplement fits into your diet and medication plan. o Coffee. Some research suggests that drinking coffee in moderation, especially regular caffeinated coffee, may be associated with a reduced risk of gout. Drinking coffee may not be appropriate if you have other medical conditions. Talk to your doctor about how much coffee is right for you. o Cherries. There is some evidence that eating cherries is associated with a reduced risk of gout attacks.  Sample menu Here's what you might eat during a typical day on a gout  diet. . Breakfast Whole-grain, unsweetened cereal with skim or low-fat milk 1 cup fresh strawberries Coffee Water . Lunch Roasted chicken breast slices (2 ounces) on a whole-grain roll with mustard Mixed green salad with vegetables, 1 tablespoon nuts, and balsamic vinegar and olive oil dressing Skim or low-fat milk or water Afternoon snack 1 cup fresh cherries Water . Dinner Roasted salmon (3 to 4 ounces) Roasted or steamed green beans 1/2 to 1 cup whole-grain pasta with olive oil and lemon pepper Water Low-fat yogurt 1 cup fresh melon Caffeine-free beverage, such as herbal tea  Follow Up Instructions: Follow-up with primary physician as needed.    Patient was given clear instructions to go to Emergency Department or return to medical center if symptoms don't improve, worsen, or new problems develop.The patient verbalized understanding.  I discussed the assessment and treatment plan with the patient. The patient was provided an opportunity to ask questions and all were answered. The patient agreed with the plan and demonstrated an understanding of the instructions.   The patient was advised to call back or  seek an in-person evaluation if the symptoms worsen or if the condition fails to improve as anticipated.   I provided 8 minutes total of non-face-to-face time during this encounter including median intraservice time, reviewing previous notes, labs, imaging, medications, management and patient verbalized understanding.    Rema Fendt, NP  [facility] [place], [place] [phone]   03/15/2020, 9:51 AM

## 2020-03-16 ENCOUNTER — Other Ambulatory Visit: Payer: Self-pay | Admitting: Family

## 2020-03-16 MED ORDER — COLCHICINE 0.6 MG PO TABS: 0.6000 mg | ORAL_TABLET | ORAL | 2 refills | Status: DC

## 2020-03-16 MED FILL — MITIGARE 0.6 MG CAPSULE: 0.6 | 30 days supply | Qty: 15 | Fill #0

## 2020-03-16 NOTE — Addendum Note (Signed)
Addended by: Rema Fendt on: 03/16/2020 08:31 AM   Modules accepted: Orders

## 2020-03-19 ENCOUNTER — Telehealth: Payer: Self-pay | Admitting: Critical Care Medicine

## 2020-03-19 ENCOUNTER — Telehealth: Payer: Self-pay

## 2020-03-19 NOTE — Telephone Encounter (Unsigned)
Copied from CRM [id number]. Topic: General - Other >> Mar 16, 2020 10:02 AM Jaquita Rector A wrote: Reason for CRM: Patient called to say that he have been waiting on a call from a Dr in regards to a medication since 03/15/20. He is asking for a call back today please. Ph# [phone]

## 2020-03-25 NOTE — Progress Notes (Signed)
Follow up visit  Subjective:   Cody [name], male    DOB: [birth year], 49 y.o.   MRN: [id number]   Chief complaint: Cardiomyopathy  49 y.o. African American male  with hypertension, possible OSA, h/o RLE DVT (2017), persistent Afib, CKD3, HFrEF.  He is doing well from cardiac standpoint. He has not had any exertional dyspnea, leg pain, chest pain. He continues to have gout flare ups. He takes ibuprofen everydya. He is still awaiting stress test.    Current Outpatient Medications on File Prior to Visit  Medication Sig Dispense Refill  . apixaban (ELIQUIS) 5 MG TABS tablet Take 1 tablet (5 mg total) by mouth 2 (two) times daily. 60 tablet 2  . carvedilol (COREG) 25 MG tablet Take 1 tablet (25 mg total) by mouth 2 (two) times daily with a meal. 180 tablet 2  . colchicine 0.6 MG tablet Take 1 tablet (0.6 mg total) by mouth every other day. 15 tablet 2  . furosemide (LASIX) 40 MG tablet Take 1 tablet (40 mg total) by mouth as directed. 2 tablets daily, one additional tablet as needed for leg edema 180 tablet 2  . HYDROcodone-acetaminophen (NORCO/VICODIN) 5-325 MG tablet Take 1-2 tablets by mouth every 6 (six) hours as needed for moderate pain. 20 tablet 0  . ibuprofen (ADVIL) 200 MG tablet Take 200 mg by mouth every 6 (six) hours as needed for headache.    . isosorbide-hydrALAZINE (BIDIL) 20-37.5 MG tablet Take 1 tablet by mouth in the morning and at bedtime. 180 tablet 2  . sacubitril-valsartan (ENTRESTO) 97-103 MG Take 1 tablet by mouth 2 (two) times daily. 60 tablet 2  . sildenafil (VIAGRA) 25 MG tablet Take 1 tablet (25 mg total) by mouth daily as needed for erectile dysfunction. 10 tablet 2   No current facility-administered medications on file prior to visit.    Cardiovascular studies:  EKG 03/26/2020: Atrial fibrillation 100 bpm Left bundle branch block QRS 136 msec ST-T changes likely related to LVH   EKG 09/30/2019: Atrial fibrillation 83 bpm. IVCD. Nonspecific  T-abnormality.   Echocardiogram 09/22/2019:  Left ventricle cavity is normal in size. Severe concentric hypertrophy of  the left ventricle. Severe global hypokinesis with severely depressed LV  systolic function with EF 20-25%. Unable to evaluate diastolic function  due to atrial fibrillation.  Left atrial cavity is severely dilated.  Trileaflet aortic valve with no regurgitation. Trace aortic stenosis.  Moderate (Grade II) mitral regurgitation.  IVC is dilated with respiratory variation. Estimated RA pressure 8 mmHg.  No significant change compared to previous study on 10/29/2018.   EKG 12/07/2018: Atrial fibrillation with controlled ventricular rate Left bundle branch block.   EKG 11/04/2018: Afib w/RVR. IVCD  LE [place] 11/02/2018: Right: There is no evidence of deep vein thrombosis in the lower extremity. No cystic structure found in the popliteal fossa.  Recent labs: 11/14/2019: Glucose 83, BUN/Cr 20/1.65. EGFR 48. Na/K 148/4.2.   12/14/2018: H/H 9.9/32.1. MCV 81. Platelets 303  Review of Systems  Cardiovascular: Negative for chest pain, dyspnea on exertion, leg swelling, palpitations and syncope.  Genitourinary:       Erectile dysfunction    Vitals:   03/26/20 0951  BP: 128/66  Pulse: 91  Resp: 16  SpO2: 94%      Objective:   Physical Exam Vitals and nursing note reviewed.  Constitutional:      Appearance: He is well-developed.  Neck:     Vascular: No JVD.  Cardiovascular:     Rate  and Rhythm: Normal rate. Rhythm irregularly irregular.     Pulses: Intact distal pulses.     Heart sounds: Normal heart sounds. No murmur heard.   Pulmonary:     Effort: Pulmonary effort is normal.     Breath sounds: Normal breath sounds. No wheezing or rales.            Assessment & Recommendations:   49 y.o. African American male  with hypertension, possible OSA, h/o RLE DVT (2017), persistent Afib, CKD3, HFrEF.  Chronic systolic heart failure (HCC) Etiology unclear.  NYHA class I-II symptoms Currently on carvedilol 25 mg twice daily, Entresto 97-103 mg bid, BiDil 20-37.5 mg,  lasix at 80 mg daily, with additional 40 mg as needed for leg edema.   Unable to use spironolactone or Farxiga due to renal dysfunction. Stress test pending Continue remote patient monitoring for blood pressure checks.   Consult regarding low-salt diet.   Avoid NSAIDS given heart failure, as well ongoing use of eliquis  Persistent atrial fibrillation Rate controlled Continue coreg 25 mg bid.  CHA2DS2VASc score 3. Annual stroke risk 4% Continue Eiquis 5 mg bid.   Essential hypertension Well controlled.   Erectile dysfunction: At this point, this is patient's biggest concern and medical issue affecting his quality of life.  Etiology for his erectile dysfunction includes is chronic systolic heart failure, as well as ongoing use of beta-blockers.  Without beta-blockers, he will almost certainly revert back to decompensated heart failure.  Use of PDE 5 inhibitors is limited due to ongoing use of BiDil.  I had a long discussion with the patient regarding risks of concurrent use of BiDil and PDE 5 inhibitor.  Patient requested that he would like to use sildenafil at least once a month.  I have explained to the patient that the only way he could use sildenafil is by not using BiDil for at least 3 days while using sildenafil.  Patient verbalized understanding of cautious use of sildenafil.  While this is not optimal from medical management of his heart failure, this probably remains the only way to treat his erectile dysfunction.  I   F/u in 4 weeks.   Nigel Mormon, MD [facility] Pager: [phone] Office: [phone] If no answer Cell [phone]

## 2020-03-26 ENCOUNTER — Ambulatory Visit: Payer: Medicaid Other | Admitting: Cardiology

## 2020-03-26 ENCOUNTER — Other Ambulatory Visit: Payer: Self-pay

## 2020-03-26 ENCOUNTER — Encounter: Payer: Self-pay | Admitting: Cardiology

## 2020-03-26 VITALS — BP 128/66 | HR 91 | Resp 16 | Ht 72.0 in | Wt 276.8 lb

## 2020-03-26 DIAGNOSIS — I4819 Other persistent atrial fibrillation: Secondary | ICD-10-CM

## 2020-03-26 DIAGNOSIS — I10 Essential (primary) hypertension: Secondary | ICD-10-CM

## 2020-03-26 DIAGNOSIS — I5022 Chronic systolic (congestive) heart failure: Secondary | ICD-10-CM

## 2020-03-26 NOTE — Telephone Encounter (Signed)
Pt called stating that he is still needing his medication for gout. He states that his gout is coming back and he has been waiting a week on this medication. He states that PCP was supposed  To look into a medication that wouldn't interact with his previous medications. Please advise.

## 2020-03-27 ENCOUNTER — Telehealth: Payer: Self-pay | Admitting: Critical Care Medicine

## 2020-03-27 MED FILL — MITIGARE 0.6 MG CAPSULE: 0.6 | 30 days supply | Qty: 15 | Fill #0

## 2020-03-27 NOTE — Telephone Encounter (Signed)
I have not seen this patient since 2019.  Amy can you f/u on this request?

## 2020-03-27 NOTE — Telephone Encounter (Signed)
Copied from CRM [id number]. Topic: General - Call Back - No Documentation >> Mar 27, 2020  3:14 PM Randol Kern wrote: Reason for CRM: Pt called back, reporting worsening symptoms with no relief. Please advise, pt is distraught and suffering badly.  Best contact: [phone]

## 2020-03-27 NOTE — Telephone Encounter (Signed)
Pt calling to check on "Gout medication." See previous encounters of 03/19/20 and yesterday. States he has not heard back if "GOUT med was OK to take with his other medications." States saw cardiologist today "He has not heard from practice." States [facility] was to consult with cardiologist regarding gout medication that would not interfere with other meds. Pt evasive historian and states frustration. States gout flare up is getting worse and does not want to go to ED or miss work. States ED usually gives him Prednisone. Call transferred to practice, [facility], for further review.

## 2020-03-27 NOTE — Telephone Encounter (Signed)
Clarified with patient and pharmacist Wynetta Emery / pt stated will come and pick up his prescription on Thursday / made pharmacy aware

## 2020-03-27 NOTE — Telephone Encounter (Signed)
Pt still waiting for prescription for gout. Pt states was prescribed medication on 03/16/20 by Ricky Stabs for gout but was not sent to the pharmacy because provider informed pt she would make sure the med will not cause problems with pt current cardiology meds. Pt states symptoms worse. Please advise.

## 2020-03-28 NOTE — Telephone Encounter (Signed)
Pt called stating medication prescribed is not giving him any relief. Please advise.

## 2020-03-28 NOTE — Telephone Encounter (Signed)
Returned pt call and pt states he received his gout medication today and he doesn't have any questions or concerns

## 2020-03-28 NOTE — Telephone Encounter (Signed)
Pt has picked up medication already

## 2020-03-30 NOTE — Telephone Encounter (Signed)
Forward for reveiw

## 2020-03-30 NOTE — Telephone Encounter (Signed)
Advised patient of message below. Patient states he will go to UC. Patient upset that he has not heard back from practice and is still requesting call back.

## 2020-03-30 NOTE — Telephone Encounter (Signed)
Forward to provider for review .

## 2020-03-30 NOTE — Telephone Encounter (Signed)
Please advise 

## 2020-03-31 NOTE — Telephone Encounter (Signed)
Pt will need another OV and in person

## 2020-04-03 NOTE — Telephone Encounter (Signed)
Appt scheduled & called pt to advise LVM for return call to confirm appt with Dr. Delford Field in Sept.

## 2020-04-09 ENCOUNTER — Other Ambulatory Visit: Payer: Medicaid Other

## 2020-04-14 IMAGING — DX DG CHEST 2V
2 series · 2 of 2 positions shown · non-contrast
Comparison: 06/17/2016

CLINICAL DATA: Shortness of breath.  Bilateral ankle swelling.

EXAM:
CHEST - 2 VIEW

[chest pa]
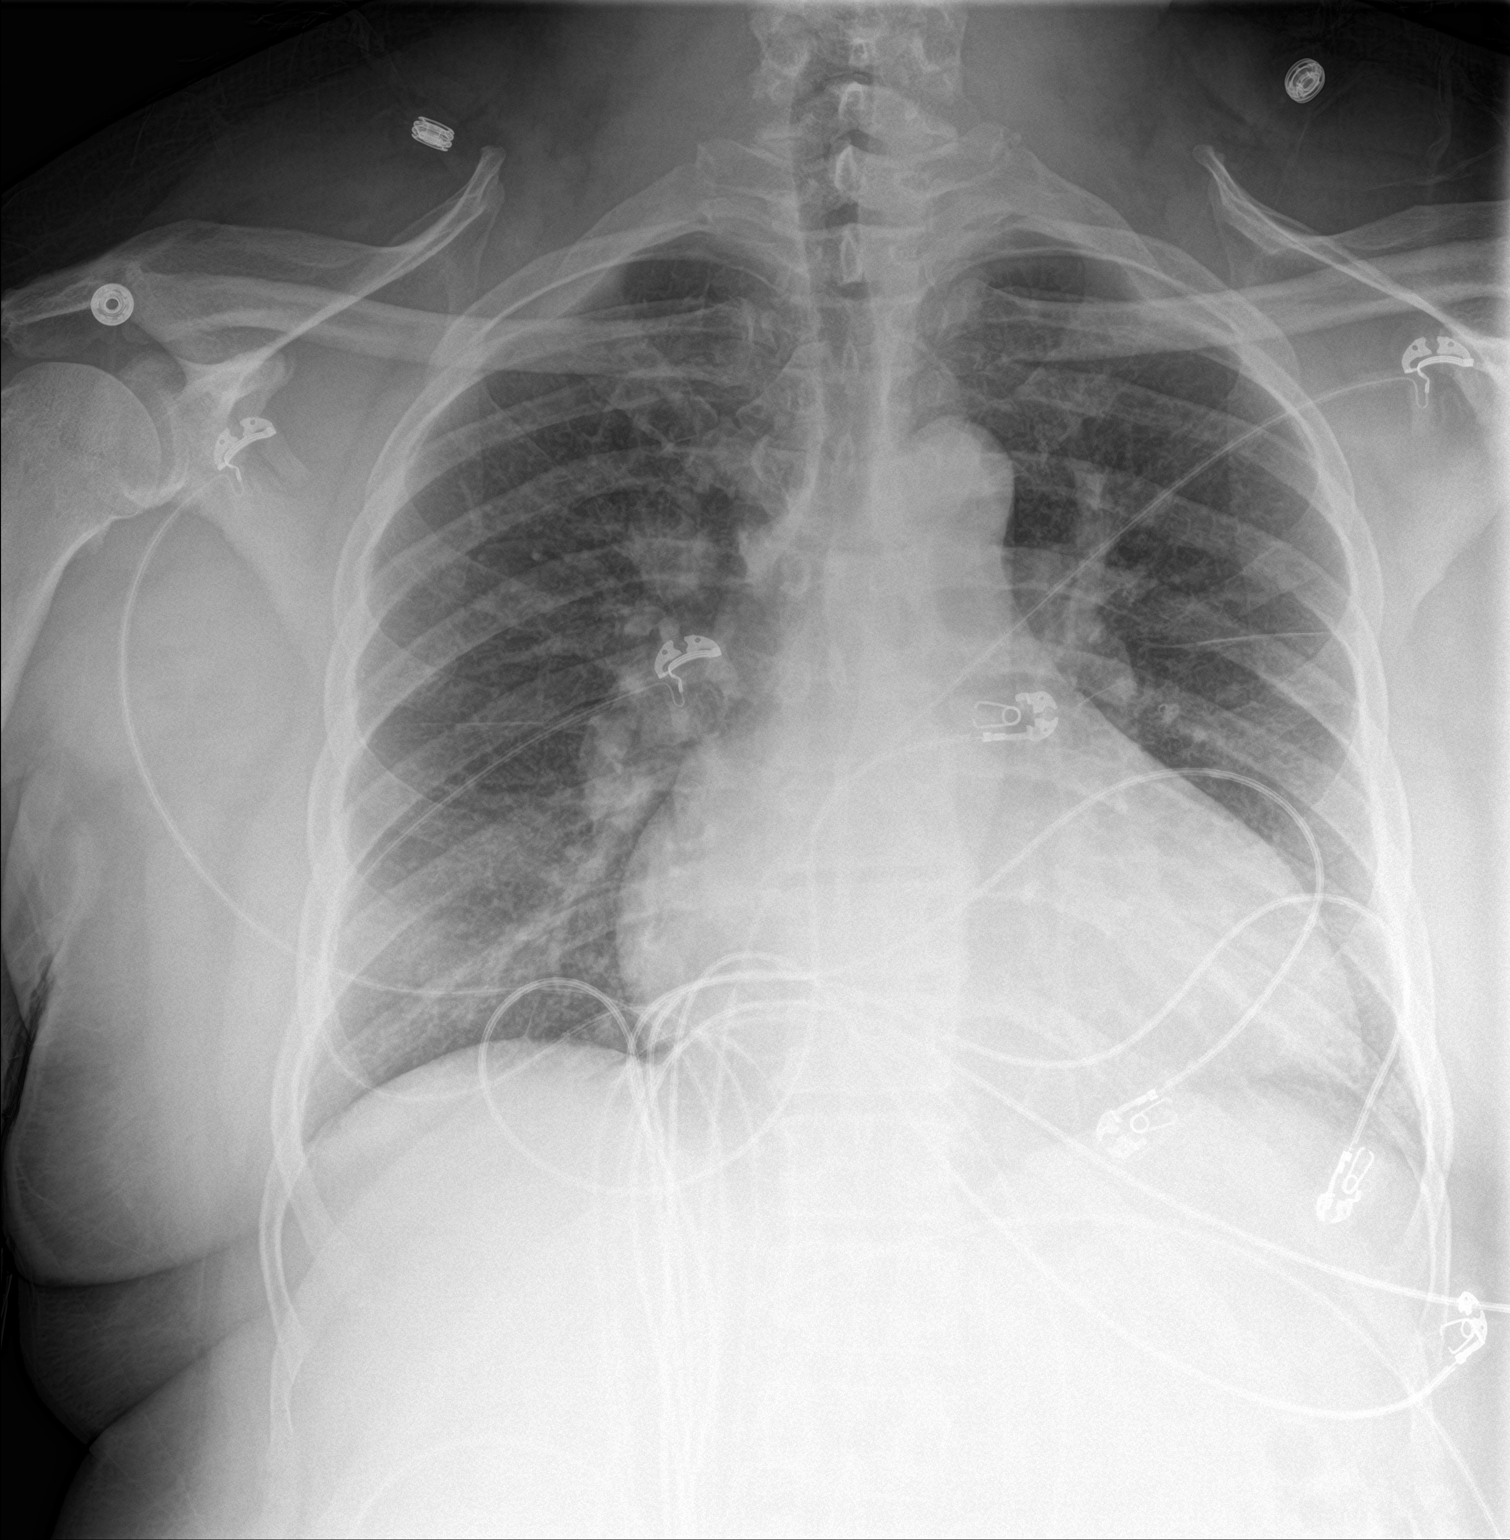

[chest lat]
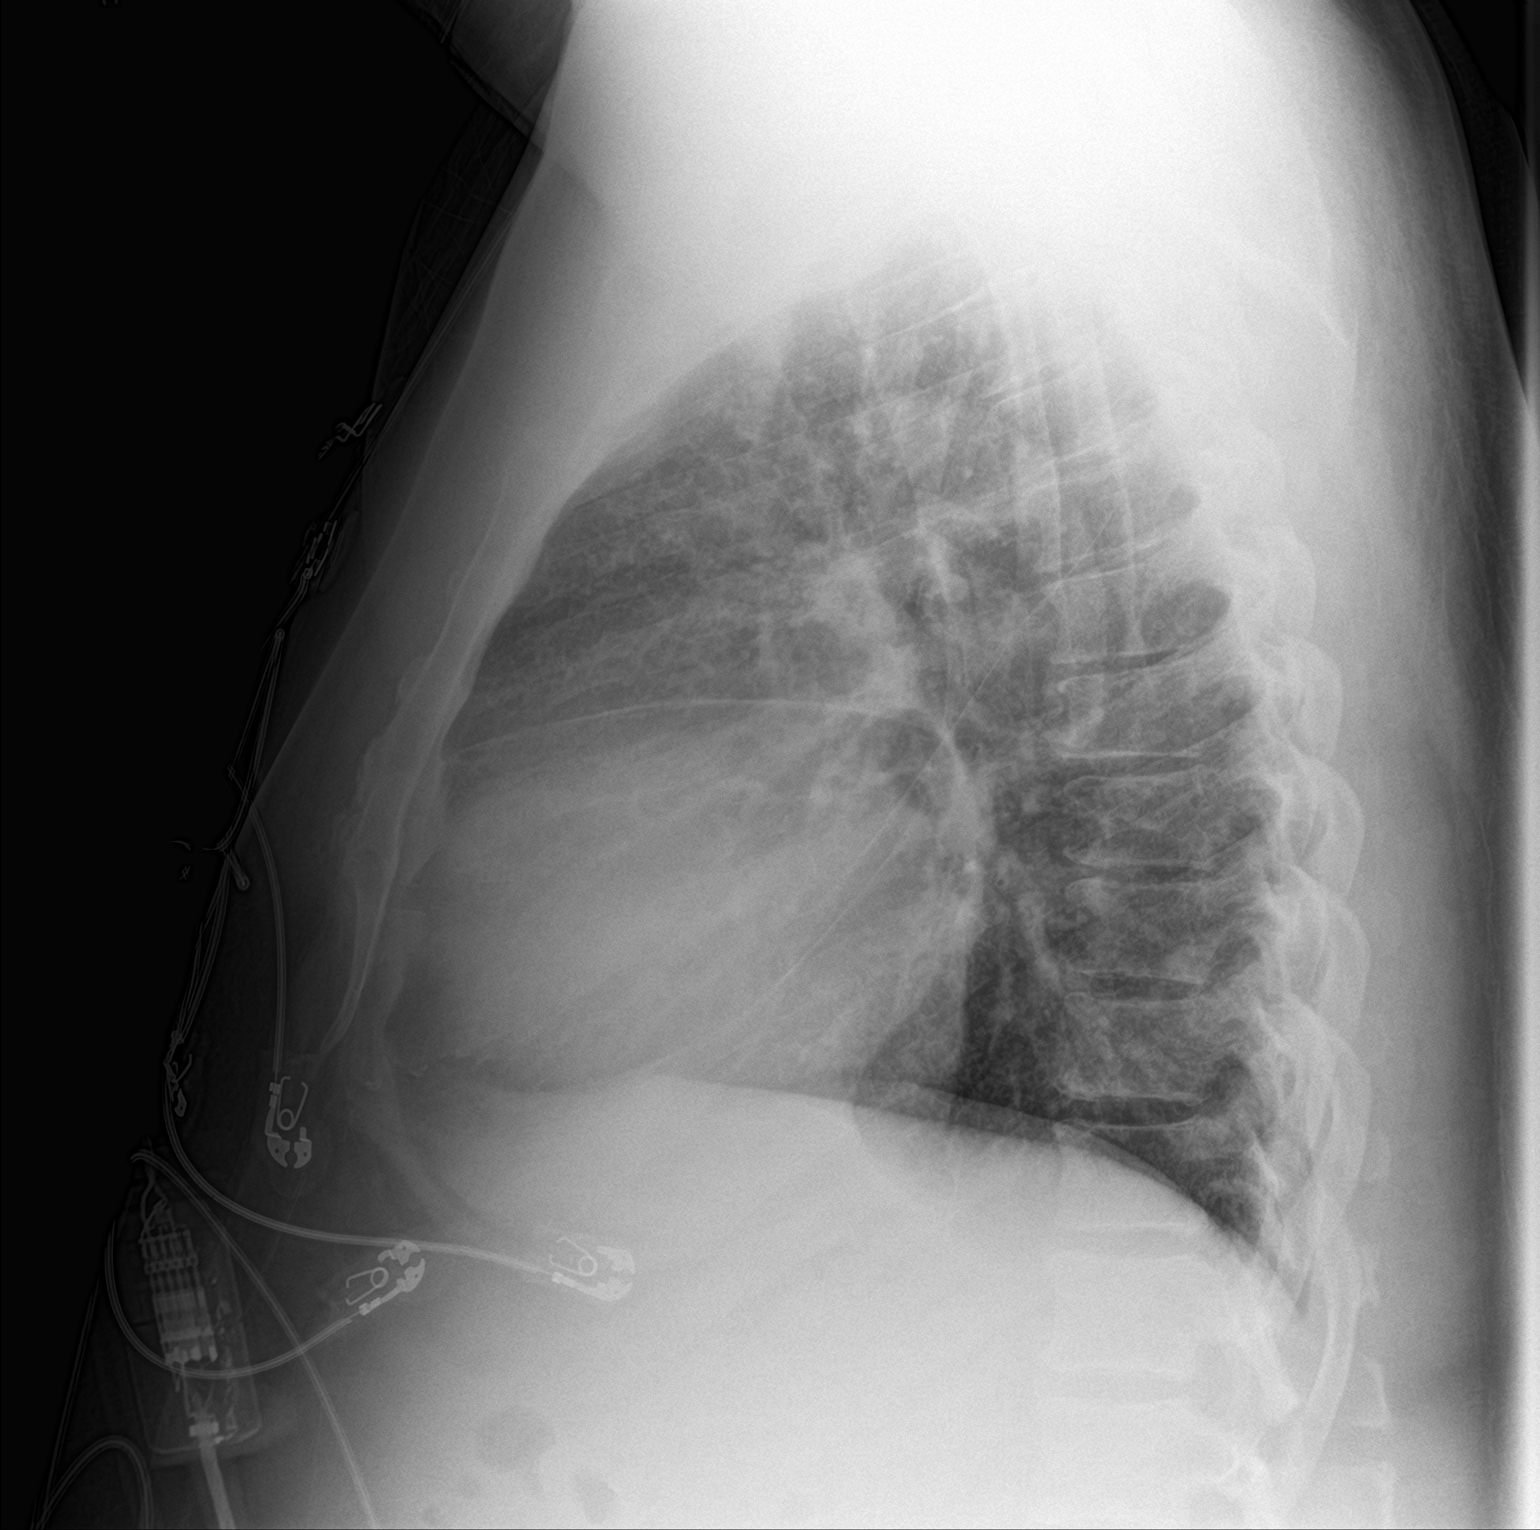

[2 of 2 positions shown; findings below may reference images not displayed]

FINDINGS: Moderate cardiac enlargement. Pulmonary vascular congestion. No
pleural effusion or overt edema. No airspace opacities. The
visualized osseous structures are unremarkable.
IMPRESSION: 1. Cardiac enlargement with pulmonary vascular congestion.

## 2020-04-16 ENCOUNTER — Telehealth: Payer: Self-pay

## 2020-04-16 MED FILL — ENTRESTO 97 MG-103 MG TAB: 97-103 | 30 days supply | Qty: 60 | Fill #2

## 2020-04-16 NOTE — Telephone Encounter (Signed)
Pt called requesting samples of entresto. Please call pt

## 2020-04-16 NOTE — Telephone Encounter (Signed)
Called pharmacy to review Rx. 1 refill on file. Processed for a co-pay of $3. Called and discussed with pt. Pt stated that he will go pick it up from the pharmacy.

## 2020-04-18 ENCOUNTER — Other Ambulatory Visit: Payer: Self-pay

## 2020-04-18 ENCOUNTER — Emergency Department (HOSPITAL_COMMUNITY)
Admission: EM | Admit: 2020-04-18 | Discharge: 2020-04-19 | Disposition: A | Discharge status: Home / Self Care | Payer: Medicaid Other | Attending: Emergency Medicine | Admitting: Emergency Medicine

## 2020-04-18 ENCOUNTER — Encounter (HOSPITAL_COMMUNITY): Payer: Self-pay

## 2020-04-18 DIAGNOSIS — M109 Gout, unspecified: Secondary | ICD-10-CM | POA: Insufficient documentation

## 2020-04-18 DIAGNOSIS — Z5321 Procedure and treatment not carried out due to patient leaving prior to being seen by health care provider: Secondary | ICD-10-CM | POA: Insufficient documentation

## 2020-04-18 DIAGNOSIS — M25562 Pain in left knee: Secondary | ICD-10-CM | POA: Diagnosis present

## 2020-04-18 NOTE — ED Triage Notes (Signed)
Pt arrives POV for eval of L knee pain that pt suspects is from a gout flare up. Denies injury/trauma.

## 2020-05-04 ENCOUNTER — Encounter (HOSPITAL_COMMUNITY): Payer: Self-pay | Admitting: Emergency Medicine

## 2020-05-04 ENCOUNTER — Emergency Department (HOSPITAL_COMMUNITY)
Admission: EM | Admit: 2020-05-04 | Discharge: 2020-05-04 | Disposition: A | Discharge status: Home / Self Care | Payer: Medicaid Other | Attending: Emergency Medicine | Admitting: Emergency Medicine

## 2020-05-04 ENCOUNTER — Other Ambulatory Visit: Payer: Self-pay

## 2020-05-04 DIAGNOSIS — M549 Dorsalgia, unspecified: Secondary | ICD-10-CM | POA: Diagnosis not present

## 2020-05-04 DIAGNOSIS — Z5321 Procedure and treatment not carried out due to patient leaving prior to being seen by health care provider: Secondary | ICD-10-CM | POA: Insufficient documentation

## 2020-05-04 NOTE — ED Notes (Signed)
Registration notified staff that pt left

## 2020-05-04 NOTE — ED Triage Notes (Signed)
Patient here from home with back pain that started yesterday.  Patient denies any injury.  He has taken some aleve with some relief.

## 2020-05-16 ENCOUNTER — Ambulatory Visit: Payer: Medicaid Other | Admitting: Critical Care Medicine

## 2020-05-16 NOTE — Progress Notes (Deleted)
Subjective:    Patient ID: Cody [name], male    DOB: 01-29-1971, 49 y.o.   MRN: [id number]  49 y.o.M here for post hosp f/u for acute CHF.  The patient was admitted on November 30 and discharged on  December 2nd for acute heart failure   Past  medical history significant ofsystolic dysfunction CHF with previous 2017 echocardiogram showing EF of 55 - 60%, hypertension, atrial fibrillation on chronic anticoagulation. Medication noncompliance. History of DVT on chronic kidney disease stage III   The patient had been admitted between November 30 and December 2  08/02/2018 Since the office visit on 07/27/2018 the patient states his dyspnea is unchanged.  It is worse with exertion.  There is also associated dizziness with any walking.  He states when he swallows he feels as though food is sticking in the throat.  There is no cough.  There is no chest pain.  The patient denies any further hematuria.  The stools now are more regular and softer on Colace.  The patient's weight has increased. As below: Wt Readings from Last 3 Encounters: 08/02/18 : 289 lb (131.1 kg) 07/27/18 : 286 lb (129.7 kg) 07/18/18 : 290 lb (131.5 kg)  The patient has been compliant with the furosemide Coreg and hydralazine.  The patient also continues the Xarelto  The patient's last echocardiogram was in 2017 and showed an ejection fraction of 55 to 60%.  Patient does have documented chronic atrial fibrillation. Patient has not been seen by cardiology previously.    Congestive Heart Failure Presents for follow-up (hx of CHF systolic dx 2 months, CKD is new) visit. Associated symptoms include chest pressure, edema, fatigue, muscle weakness, near-syncope, orthopnea, palpitations, paroxysmal nocturnal dyspnea, shortness of breath and unexpected weight change. Pertinent negatives include no abdominal pain, chest pain, claudication or nocturia. (Constipation is noted  Notes R sided knee pain Leg edema is better ) The  symptoms have been worsening. Compliance problems include medication cost and adherence to diet (not able to work on any jobs and so no dollars or med support).  Compliance with diet is 0-25%. Compliance with exercise is 0-25%. Compliance with medications is 76-100%.    Past Medical History:  Diagnosis Date  . Atrial fibrillation (HCC)   . CKD (chronic kidney disease), stage III    [name] 02/20/2016  . DVT (deep venous thrombosis) (HCC) 01/2016   a. RLE - Dx 01/2016 in [name] - Took Xarelto for 30 days - out x 2 wks.  [name]   . Hypertension   . Hypertensive heart disease with CHF (congestive heart failure) (HCC)      Family History  Problem Relation Age of Onset  . Asthma Mother        alive and well  . Hypertension Father        alive  . Lung cancer Father   . Cancer Father      Social History   Socioeconomic History  . Marital status: Legally Separated    Spouse name: Not on file  . Number of children: 1  . Years of education: Not on file  . Highest education level: Not on file  Occupational History  . Not on file  Tobacco Use  . Smoking status: Never Smoker  . Smokeless tobacco: Never Used  Vaping Use  . Vaping Use: Never used  Substance and Sexual Activity  . Alcohol use: No  . Drug use: No  . Sexual activity: Yes  Other Topics Concern  .  Not on file  Social History Narrative   Lives in [place].  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   Social Determinants of Health   Financial Resource Strain:   . Difficulty of Paying Living Expenses: Not on file  Food Insecurity:   . Worried About Programme researcher, broadcasting/film/video in the Last Year: Not on file  . Ran Out of Food in the Last Year: Not on file  Transportation Needs:   . Lack of Transportation (Medical): Not on file  . Lack of Transportation (Non-Medical): Not on file  Physical Activity:   . Days of Exercise per Week: Not on file  . Minutes of Exercise per Session: Not on file  Stress:    . Feeling of Stress : Not on file  Social Connections:   . Frequency of Communication with Friends and Family: Not on file  . Frequency of Social Gatherings with Friends and Family: Not on file  . Attends Religious Services: Not on file  . Active Member of Clubs or Organizations: Not on file  . Attends Banker Meetings: Not on file  . Marital Status: Not on file  Intimate Partner Violence:   . Fear of Current or Ex-Partner: Not on file  . Emotionally Abused: Not on file  . Physically Abused: Not on file  . Sexually Abused: Not on file     No Known Allergies   Outpatient Medications Prior to Visit  Medication Sig Dispense Refill  . apixaban (ELIQUIS) 5 MG TABS tablet Take 1 tablet (5 mg total) by mouth 2 (two) times daily. 60 tablet 2  . carvedilol (COREG) 25 MG tablet Take 1 tablet (25 mg total) by mouth 2 (two) times daily with a meal. 180 tablet 2  . colchicine 0.6 MG tablet Take 1 tablet (0.6 mg total) by mouth every other day. 15 tablet 2  . furosemide (LASIX) 40 MG tablet Take 1 tablet (40 mg total) by mouth as directed. 2 tablets daily, one additional tablet as needed for leg edema 180 tablet 2  . HYDROcodone-acetaminophen (NORCO/VICODIN) 5-325 MG tablet Take 1-2 tablets by mouth every 6 (six) hours as needed for moderate pain. 20 tablet 0  . isosorbide-hydrALAZINE (BIDIL) 20-37.5 MG tablet Take 1 tablet by mouth in the morning and at bedtime. 180 tablet 2  . sacubitril-valsartan (ENTRESTO) 97-103 MG Take 1 tablet by mouth 2 (two) times daily. 60 tablet 2  . sildenafil (VIAGRA) 25 MG tablet Take 1 tablet (25 mg total) by mouth daily as needed for erectile dysfunction. 10 tablet 2   No facility-administered medications prior to visit.     Review of Systems  Constitutional: Positive for fatigue and unexpected weight change.  Respiratory: Positive for cough and shortness of breath. Negative for chest tightness and wheezing.   Cardiovascular: Positive for  palpitations and near-syncope. Negative for chest pain, claudication and leg swelling.  Gastrointestinal: Positive for constipation. Negative for abdominal distention, abdominal pain, nausea and vomiting.  Genitourinary: Positive for hematuria. Negative for nocturia.  Musculoskeletal: Positive for muscle weakness.  Skin: Negative.   Neurological: Positive for weakness. Negative for dizziness, light-headedness and headaches.       Objective:   Physical Exam There were no vitals filed for this visit.  Gen: Pleasant, well-nourished, in no distress,  normal affect  ENT: No lesions,  mouth clear,  oropharynx clear, no postnasal drip  Neck: 2+ JVD, no TMG, no carotid bruits  Lungs: No use of  accessory muscles, no dullness to percussion, distant BS  Cardiovascular:irreg irreg, heart sounds normal, no murmur or gallops, 4+  peripheral edema to pre tibial area  Abdomen: distended  ++ascites,  no HSM,  BS normal  Musculoskeletal: No deformities, no cyanosis or clubbing  Neuro: alert, non focal  Skin: Warm, no lesions or rashes     CXR 11/30: CM, mild edema  CBC Latest Ref Rng & Units 12/14/2018 11/04/2018 10/29/2018  WBC 4.0 - 10.5 K/uL 7.8 10.7(H) 9.3  Hemoglobin 13.0 - 17.0 g/dL 7.6(L) 11.4(L) 11.3(L)  Hematocrit 39 - 52 % 32.1(L) 38.5(L) 37.6(L)  Platelets 150 - 400 K/uL 303 386 336   BMP Latest Ref Rng & Units 11/14/2019 12/14/2018 11/04/2018  Glucose 65 - 99 mg/dL 83 96 465(K)  BUN 6 - 24 mg/dL 20 14 35(W)  Creatinine 0.76 - 1.27 mg/dL 6.56(C) 1.27(N) 1.70(Y)  BUN/Creat Ratio 9 - 20 12 - -  Sodium 134 - 144 mmol/L 144 140 140  Potassium 3.5 - 5.2 mmol/L 4.2 3.4(L) 3.5  Chloride 96 - 106 mmol/L 103 100 104  CO2 20 - 29 mmol/L 27 27 27   Calcium 8.7 - 10.2 mg/dL 9.7 9.5 9.0   CrCl  48 >> 12/10 52     Assessment & Plan:  I personally reviewed all images and lab data in the [facility] system as well as any outside material available during this office visit and agree with the   radiology impressions.   No problem-specific Assessment & Plan notes found for this encounter.   There are no diagnoses linked to this encounter.

## 2020-05-17 ENCOUNTER — Telehealth: Payer: Self-pay | Admitting: Critical Care Medicine

## 2020-05-21 ENCOUNTER — Other Ambulatory Visit: Payer: Self-pay | Admitting: Cardiology

## 2020-05-21 DIAGNOSIS — I5022 Chronic systolic (congestive) heart failure: Secondary | ICD-10-CM

## 2020-05-21 MED FILL — FUROSEMIDE 40 MG TAB: 40 | 60 days supply | Qty: 180 | Fill #2

## 2020-05-29 ENCOUNTER — Telehealth: Payer: Self-pay

## 2020-06-04 NOTE — Telephone Encounter (Signed)
Error

## 2020-06-07 ENCOUNTER — Other Ambulatory Visit: Payer: Self-pay

## 2020-06-07 ENCOUNTER — Other Ambulatory Visit: Payer: Self-pay | Admitting: Cardiology

## 2020-06-07 ENCOUNTER — Telehealth: Payer: Self-pay

## 2020-06-07 DIAGNOSIS — I5022 Chronic systolic (congestive) heart failure: Secondary | ICD-10-CM

## 2020-06-07 MED ORDER — ENTRESTO 97-103 MG PO TABS: 1.0000 | ORAL_TABLET | Freq: Two times a day (BID) | ORAL | 6 refills | Status: DC

## 2020-06-07 NOTE — Telephone Encounter (Signed)
Entresto Pa was done and sent to [employer] [facility]

## 2020-06-18 ENCOUNTER — Telehealth: Payer: Self-pay

## 2020-06-19 MED FILL — CARVEDILOL 25 MG TABLET: 25 | 90 days supply | Qty: 180 | Fill #2

## 2020-06-20 ENCOUNTER — Telehealth: Payer: Self-pay

## 2020-06-20 MED FILL — ENTRESTO 97 MG-103 MG TAB: 97-103 | 30 days supply | Qty: 60 | Fill #0

## 2020-06-20 NOTE — Telephone Encounter (Signed)
Patients Cody [name] was denied, patient assistance form left up front for him

## 2020-06-21 ENCOUNTER — Telehealth: Payer: Self-pay | Admitting: Critical Care Medicine

## 2020-06-21 ENCOUNTER — Other Ambulatory Visit: Payer: Self-pay

## 2020-06-21 DIAGNOSIS — I5022 Chronic systolic (congestive) heart failure: Secondary | ICD-10-CM

## 2020-06-21 MED ORDER — ENTRESTO 97-103 MG PO TABS: 1.0000 | ORAL_TABLET | Freq: Two times a day (BID) | ORAL | 3 refills | Status: DC

## 2020-06-21 MED FILL — ENTRESTO 97 MG-103 MG TAB: 97-103 | 30 days supply | Qty: 60 | Fill #0

## 2020-06-21 MED FILL — MITIGARE 0.6 MG CAPS: 0.6 | 30 days supply | Qty: 15 | Fill #1

## 2020-06-22 ENCOUNTER — Encounter (HOSPITAL_COMMUNITY): Payer: Self-pay | Admitting: Emergency Medicine

## 2020-06-22 ENCOUNTER — Other Ambulatory Visit (HOSPITAL_COMMUNITY): Payer: Self-pay | Admitting: Emergency Medicine

## 2020-06-22 ENCOUNTER — Emergency Department (HOSPITAL_COMMUNITY)
Admission: EM | Admit: 2020-06-22 | Discharge: 2020-06-22 | Disposition: A | Discharge status: Home / Self Care | Payer: Medicaid Other | Attending: Emergency Medicine | Admitting: Emergency Medicine

## 2020-06-22 ENCOUNTER — Other Ambulatory Visit: Payer: Self-pay

## 2020-06-22 DIAGNOSIS — I5022 Chronic systolic (congestive) heart failure: Secondary | ICD-10-CM | POA: Insufficient documentation

## 2020-06-22 DIAGNOSIS — Z7901 Long term (current) use of anticoagulants: Secondary | ICD-10-CM | POA: Insufficient documentation

## 2020-06-22 DIAGNOSIS — M10062 Idiopathic gout, left knee: Secondary | ICD-10-CM | POA: Insufficient documentation

## 2020-06-22 DIAGNOSIS — N183 Chronic kidney disease, stage 3 unspecified: Secondary | ICD-10-CM | POA: Insufficient documentation

## 2020-06-22 DIAGNOSIS — I130 Hypertensive heart and chronic kidney disease with heart failure and stage 1 through stage 4 chronic kidney disease, or unspecified chronic kidney disease: Secondary | ICD-10-CM | POA: Insufficient documentation

## 2020-06-22 DIAGNOSIS — M25462 Effusion, left knee: Secondary | ICD-10-CM | POA: Diagnosis not present

## 2020-06-22 DIAGNOSIS — M109 Gout, unspecified: Secondary | ICD-10-CM

## 2020-06-22 DIAGNOSIS — M25562 Pain in left knee: Secondary | ICD-10-CM | POA: Diagnosis present

## 2020-06-22 DIAGNOSIS — Z79899 Other long term (current) drug therapy: Secondary | ICD-10-CM | POA: Diagnosis not present

## 2020-06-22 LAB — SYNOVIAL CELL COUNT + DIFF, W/ CRYSTALS
Eosinophils-Synovial: 0 % (ref 0–1)
Lymphocytes-Synovial Fld: 5 % (ref 0–20)
Monocyte-Macrophage-Synovial Fluid: 18 % — ABNORMAL LOW (ref 50–90)
Neutrophil, Synovial: 77 % — ABNORMAL HIGH (ref 0–25)
WBC, Synovial: 29750 /mm3 — ABNORMAL HIGH (ref 0–200)

## 2020-06-22 LAB — GRAM STAIN

## 2020-06-22 MED ORDER — PREDNISONE 20 MG PO TABS
60.0000 mg | ORAL_TABLET | Freq: Once | ORAL | Status: AC
  Administered 2020-06-22: 60 mg via ORAL
  Filled 2020-06-22: qty 3

## 2020-06-22 MED ORDER — HYDROCODONE-ACETAMINOPHEN 5-325 MG PO TABS: 1.0000 | ORAL_TABLET | ORAL | 0 refills | Status: DC | PRN

## 2020-06-22 MED ORDER — PREDNISONE 20 MG PO TABS
ORAL_TABLET | ORAL | 0 refills | Status: DC
Start: 2020-06-22 — End: 2020-08-15

## 2020-06-22 MED ORDER — LIDOCAINE-EPINEPHRINE 1 %-1:100000 IJ SOLN
20.0000 mL | Freq: Once | INTRAMUSCULAR | Status: DC
  Filled 2020-06-22: qty 1

## 2020-06-22 NOTE — Progress Notes (Signed)
Orthopedic Tech Progress Note Patient Details:  Cody [name] 30-Dec-1970 [id number]  Ortho Devices Type of Ortho Device: Crutches Ortho Device/Splint Interventions: Ordered, Adjustment   Post Interventions Patient Tolerated: Well Instructions Provided: Care of device, Adjustment of device, Poper ambulation with device   [name] 06/22/2020, 8:13 PM

## 2020-06-22 NOTE — Discharge Instructions (Addendum)
If you develop fever, redness or new swelling to the knee, new or worsening pain, or any other new/concerning symptoms then return to the ER for evaluation.

## 2020-06-22 NOTE — ED Provider Notes (Signed)
[facility] EMERGENCY DEPARTMENT Provider Note   CSN: [id number] Arrival date & time: 06/22/20  1340     History Chief Complaint  Patient presents with  . Gout    Cody [name] is a 49 y.o. male.  HPI 49 year old male presents with left knee pain and swelling.  Started yesterday.  This feels like a recurrent gout issue to him.  He states it is possible to bend his knee but it is very painful and he is hardly able to walk on it.  No fevers.  No trauma.   Past Medical History:  Diagnosis Date  . Atrial fibrillation (HCC)   . CKD (chronic kidney disease), stage III (HCC)    [name] 02/20/2016  . DVT (deep venous thrombosis) (HCC) 01/2016   a. RLE - Dx 01/2016 in [name] - Took Xarelto for 30 days - out x 2 wks.  [name]   . Hypertension   . Hypertensive heart disease with CHF (congestive heart failure) [facility])     Patient Active Problem List   Diagnosis Date Noted  . Erectile dysfunction 12/05/2019  . Acute pain of right knee 12/08/2018  . Atrial fibrillation with RVR (HCC) 10/29/2018  . Hypertensive urgency 10/29/2018  . Persistent atrial fibrillation (HCC) 10/29/2018  . Chronic atrial fibrillation (HCC) 08/02/2018  . History of DVT (deep vein thrombosis) 07/27/2018  . Anemia due to stage 3 chronic kidney disease (HCC) 07/27/2018  . Heme positive stool 07/27/2018  . Essential hypertension 07/19/2018  . Chronic systolic heart failure (HCC) 07/17/2018  . Obstructive sleep apnea 12/11/2016  . H/O noncompliance with medical treatment, presenting hazards to health   . Elevated troponin 02/20/2016  . CKD (chronic kidney disease) stage 3, GFR 30-59 ml/min (HCC) 02/20/2016    Past Surgical History:  Procedure Laterality Date  . NO PAST SURGERIES         Family History  Problem Relation Age of Onset  . Asthma Mother        alive and well  . Hypertension Father        alive  . Lung cancer Father   . Cancer Father     Social History   Tobacco Use   . Smoking status: Never Smoker  . Smokeless tobacco: Never Used  Vaping Use  . Vaping Use: Never used  Substance Use Topics  . Alcohol use: No  . Drug use: No    Home Medications Prior to Admission medications   Medication Sig Start Date End Date Taking? Authorizing Provider  apixaban (ELIQUIS) 5 MG TABS tablet Take 1 tablet (5 mg total) by mouth 2 (two) times daily. 12/05/19   Patwardhan, Anabel Bene, MD  carvedilol (COREG) 25 MG tablet Take 1 tablet (25 mg total) by mouth 2 (two) times daily with a meal. 12/05/19   Patwardhan, Manish J, MD  colchicine 0.6 MG tablet Take 1 tablet (0.6 mg total) by mouth every other day. 03/16/20   Rema Fendt, NP  furosemide (LASIX) 40 MG tablet Take 1 tablet (40 mg total) by mouth as directed. 2 tablets daily, one additional tablet as needed for leg edema 12/30/19   Patwardhan, Anabel Bene, MD  HYDROcodone-acetaminophen (NORCO) 5-325 MG tablet Take 1 tablet by mouth every 4 (four) hours as needed for severe pain. 06/22/20   Pricilla Loveless, MD  isosorbide-hydrALAZINE (BIDIL) 20-37.5 MG tablet Take 1 tablet by mouth in the morning and at bedtime. 12/05/19   Patwardhan, Anabel Bene, MD  predniSONE (DELTASONE) 20  MG tablet 3 tabs po daily x 2 days, then 2 tabs x 3 days, then 1.5 tabs x 3 days, then 1 tab x 3 days, then 0.5 tabs x 3 days 06/22/20   Pricilla Loveless, MD  sacubitril-valsartan (ENTRESTO) 97-103 MG Take 1 tablet by mouth 2 (two) times daily. 06/21/20   Patwardhan, Anabel Bene, MD  sildenafil (VIAGRA) 25 MG tablet Take 1 tablet (25 mg total) by mouth daily as needed for erectile dysfunction. 12/30/19   Patwardhan, Anabel Bene, MD    Allergies    Patient has no known allergies.  Review of Systems   Review of Systems  Constitutional: Negative for fever.  Musculoskeletal: Positive for arthralgias and joint swelling.    Physical Exam Updated Vital Signs BP 110/74 (BP Location: Right Arm)   Pulse 68   Temp 98.6 F (37 C) (Oral)   Resp 19   Ht 6\' 1"  (1.854 m)    Wt 122.5 kg   SpO2 100%   BMI 35.62 kg/m   Physical Exam Vitals and nursing note reviewed.  Constitutional:      Appearance: He is well-developed. He is obese.  HENT:     Head: Normocephalic and atraumatic.     Right Ear: External ear normal.     Left Ear: External ear normal.     Nose: Nose normal.  Eyes:     General:        Right eye: No discharge.        Left eye: No discharge.  Cardiovascular:     Rate and Rhythm: Normal rate and regular rhythm.  Pulmonary:     Effort: Pulmonary effort is normal.  Abdominal:     General: There is no distension.  Musculoskeletal:     Cervical back: Neck supple.  Skin:    General: Skin is warm and dry.     Findings: No erythema.  Neurological:     Mental Status: He is alert.  Psychiatric:        Mood and Affect: Mood is not anxious.     ED Results / Procedures / Treatments   Labs (all labs ordered are listed, but only abnormal results are displayed) Labs Reviewed  SYNOVIAL CELL COUNT + DIFF, W/ CRYSTALS - Abnormal; Notable for the following components:      Result Value   Appearance-Synovial TURBID (*)    WBC, Synovial 29,750 (*)    Neutrophil, Synovial 77 (*)    Monocyte-Macrophage-Synovial Fluid 18 (*)    All other components within normal limits  GRAM STAIN  CULTURE, BODY FLUID-BOTTLE    EKG None  Radiology No results found.  Procedures Procedures (including critical care time)  Medications Ordered in ED Medications  lidocaine-EPINEPHrine (XYLOCAINE W/EPI) 1 %-1:100000 (with pres) injection 20 mL (has no administration in time range)  predniSONE (DELTASONE) tablet 60 mg (60 mg Oral Given 06/22/20 1946)    ED Course  I have reviewed the triage vital signs and the nursing notes.  Pertinent labs & imaging results that were available during my care of the patient were reviewed by me and considered in my medical decision making (see chart for details).    MDM Rules/Calculators/A&P                           Patient with recurrent knee effusion. Appears likely to be gout. Will start on steroids, give hydrocodone for pain. See resident note for arthrocentesis. Fluid seems c/w gout, doubt septic  joint. D/c home with return precautions.  Final Clinical Impression(s) / ED Diagnoses Final diagnoses:  Gouty arthritis of left knee  Effusion of left knee    Rx / DC Orders ED Discharge Orders         Ordered    predniSONE (DELTASONE) 20 MG tablet        06/22/20 1930    HYDROcodone-acetaminophen (NORCO) 5-325 MG tablet  Every 4 hours PRN        06/22/20 1931           Pricilla Loveless, MD 06/22/20 2321

## 2020-06-22 NOTE — ED Provider Notes (Signed)
  Physical Exam  BP 107/61 (BP Location: Right Arm)   Pulse 70   Temp 98.6 F (37 C) (Oral)   Resp 16   Ht 6\' 1"  (1.854 m)   Wt 122.5 kg   SpO2 100%   BMI 35.62 kg/m   Physical Exam  ED Course/Procedures     .Joint Aspiration/Arthrocentesis  Date/Time: 06/22/2020 7:25 PM Performed by: 13/12/2019, MD Authorized by: Loletha Carrow, MD   Consent:    Consent obtained:  Verbal   Consent given by:  Patient   Risks discussed:  Bleeding, infection, pain and incomplete drainage   Alternatives discussed:  No treatment Location:    Location:  Knee   Knee:  L knee Anesthesia (see MAR for exact dosages):    Anesthesia method:  Local infiltration   Local anesthetic:  Lidocaine 1% WITH epi Procedure details:    Preparation: Patient was prepped and draped in usual sterile fashion     Needle gauge:  18 G   Ultrasound guidance: no     Approach:  Lateral   Aspirate amount:  Pricilla Loveless   Aspirate characteristics:  Yellow   Steroid injected: no     Specimen collected: yes   Post-procedure details:    Dressing:  Adhesive bandage   Patient tolerance of procedure:  Tolerated well, no immediate complications    MDM   This aspiration of the left knee was directly supervised by Dr. .       Criss Alvine, MD 06/22/20 13/05/21    Kristopher Oppenheim, MD 06/22/20 2322

## 2020-06-22 NOTE — ED Triage Notes (Signed)
Pt reports history of gout and since yesterday has had pain and swelling in left knee.

## 2020-06-25 ENCOUNTER — Telehealth: Payer: Self-pay

## 2020-06-25 NOTE — Telephone Encounter (Signed)
We received a script for Hydrocodone for this patient, but [facility] does not dispense narcotics.  Script would have to be sent to another pharmacy as we are unable to transfer narcotics.

## 2020-06-27 LAB — CULTURE, BODY FLUID W GRAM STAIN -BOTTLE: Culture: NO GROWTH

## 2020-07-04 ENCOUNTER — Telehealth: Payer: Self-pay

## 2020-07-04 NOTE — Telephone Encounter (Signed)
error 

## 2020-07-23 MED FILL — ENTRESTO 97 MG-103 MG TAB: 97-103 | 30 days supply | Qty: 60 | Fill #1

## 2020-07-27 IMAGING — CT CT OF THE RIGHT KNEE WITHOUT CONTRAST
3 series · 14 of 35 positions shown, 17 images · non-contrast
Comparison: Knee radiographs 10/07/2018

CLINICAL DATA: Right knee pain and swelling for weeks without known
injury.

EXAM:
CT OF THE RIGHT KNEE WITHOUT CONTRAST
TECHNIQUE: Multidetector CT imaging of the RIGHT knee was performed according
to the standard protocol. Multiplanar CT image reconstructions were
also generated.

[Series 4: extremity soft tissue · axial · 0.47mm/px · z∈[+418,+568]mm · 6 of 99 slices shown, 8 images]
[im 16/99  soft-tissue]
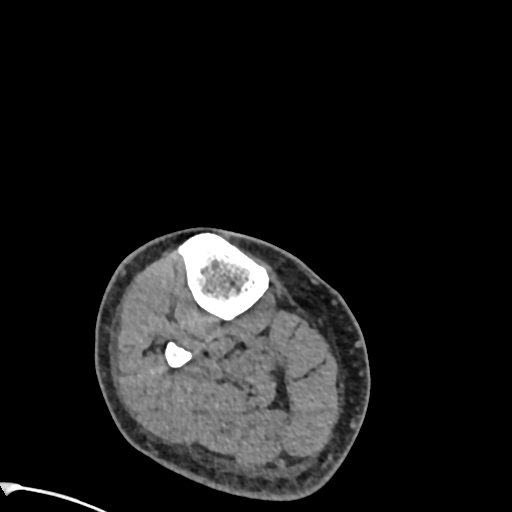
[im 16/99  bone]
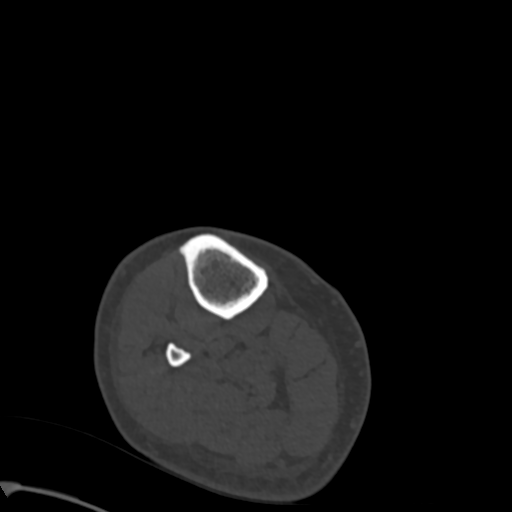
[im 31/99  bone]
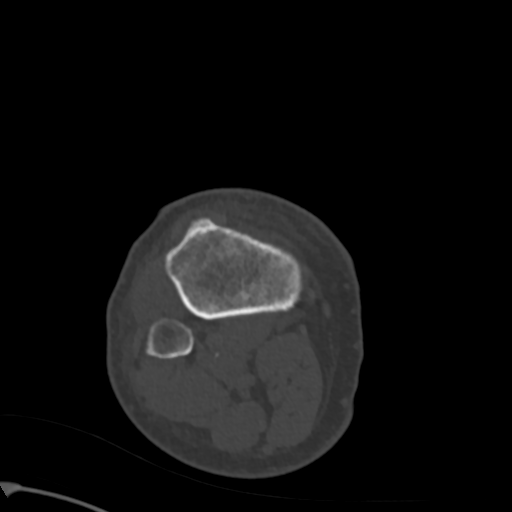
[im 46/99  bone]
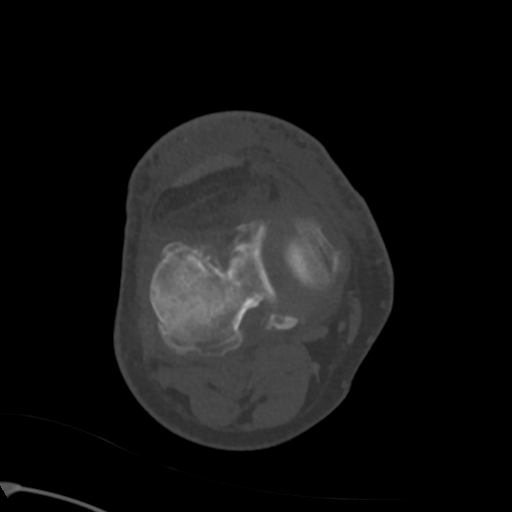
[im 61/99  bone]
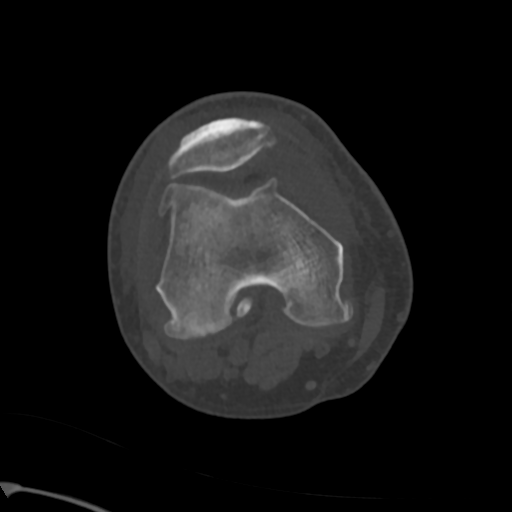
[im 76/99  soft-tissue]
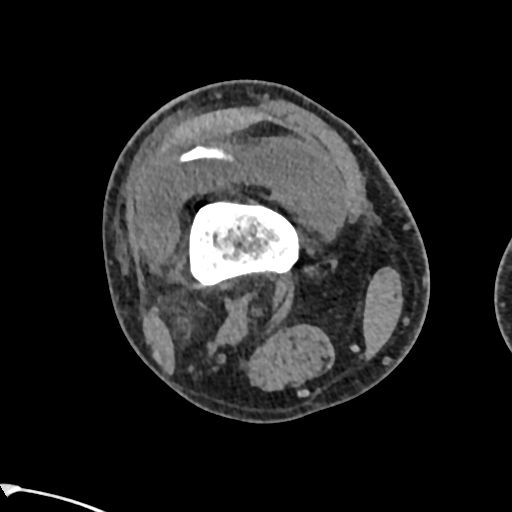
[im 76/99  bone]
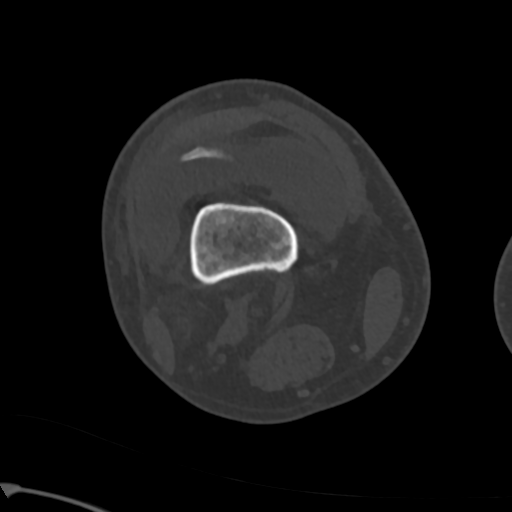
[im 91/99  bone]
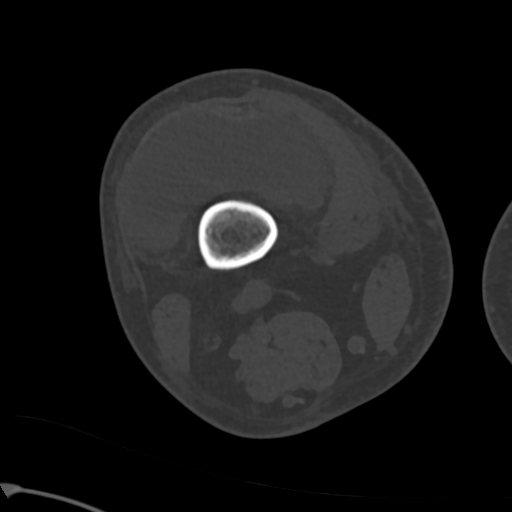

[Series 8: cor soft tissue · coronal · 0.39mm/px · 3 of 105 slices shown]
[im 21/105  bone]
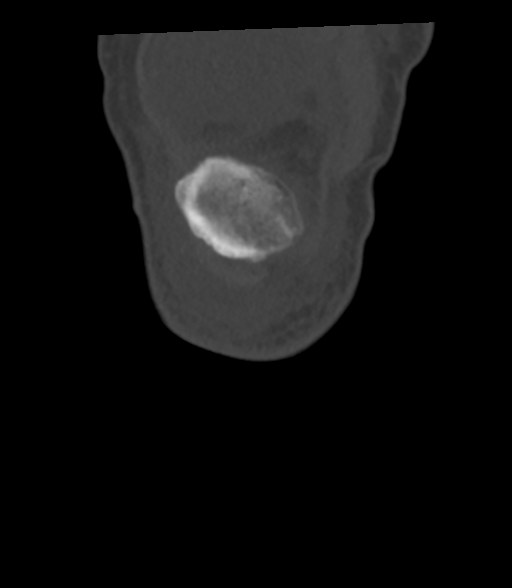
[im 42/105  bone]
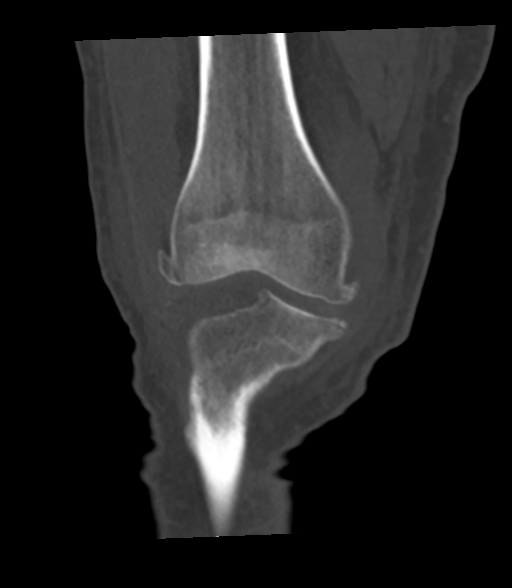
[im 63/105  bone]
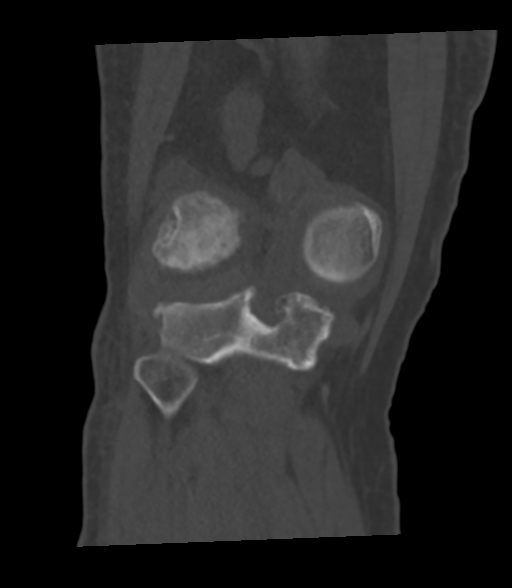

[Series 9: sag soft tissue · sagittal · 0.40mm/px · 5 of 85 slices shown, 6 images]
[im 29/85  bone]
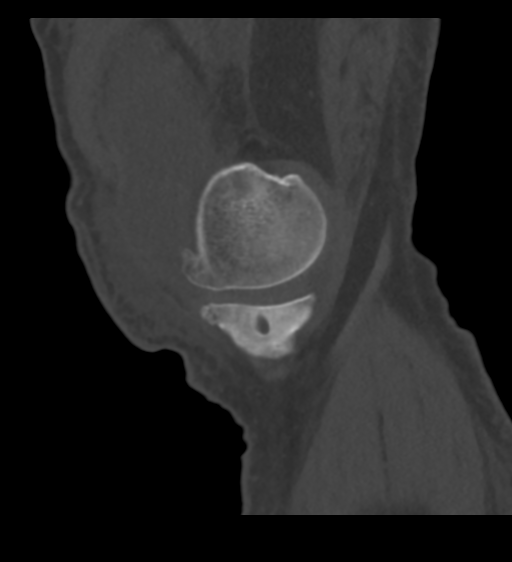
[im 36/85  bone]
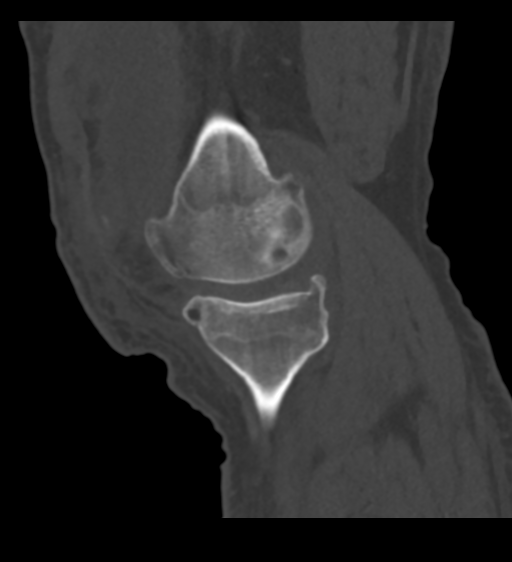
[im 43/85  soft-tissue]
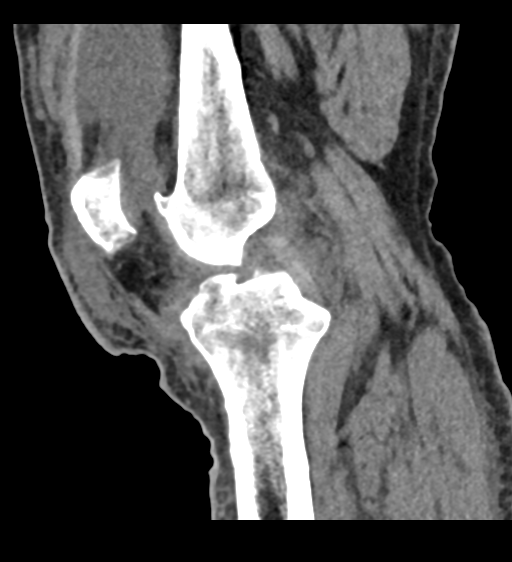
[im 43/85  bone]
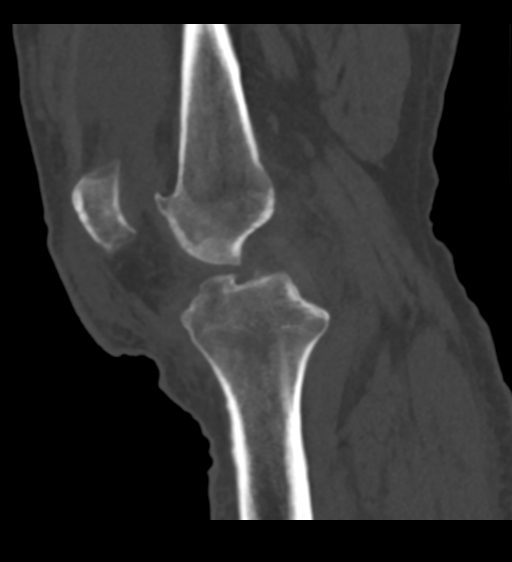
[im 50/85  bone]
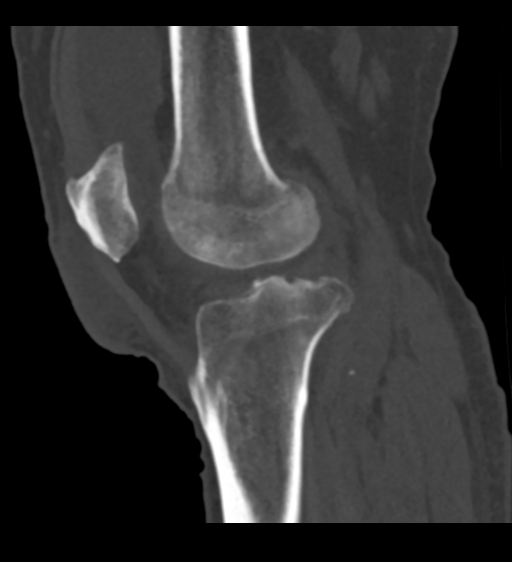
[im 57/85  bone]
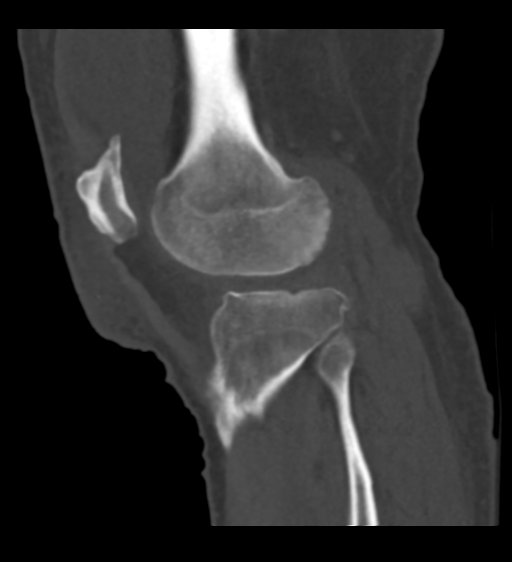

[14 of 35 positions shown; findings below may reference images not displayed]

FINDINGS: Bones/Joint/Cartilage

Subchondral degenerative cystic change is noted of the tibial
plateau more so laterally with degenerative spurring of the tibial
spine, tibial plateau and both femoral condyles. The presence of
subchondral degenerative change raise suspicion for full thickness
chondral loss and or fissuring especially of the lateral tibial
plateau cartilage. No acute fracture or suspicious osseous lesions.

Moderate to large suprapatellar joint effusion is partially
included. Prepatellar bursal fluid is also seen. Soft tissue
induration of Hoffa's fat pad is noted, nonspecific. The
constellation of these findings may represent a marked synovitis.

Ligaments

Suboptimally assessed by CT. Slight undulating appearance of the ACL
may represent a sprain or tear, series [DATE]. MRI may help for
further correlation if possible. The course and morphology of the
PCL appears largely intact. The collateral ligaments are
suboptimally assessed on CT of the expected course of the collateral
ligaments appear largely intact.

Muscles and Tendons

No atrophy.  No popliteal cyst.  Intact popliteus.

Soft tissues

Prepatellar bursal fluid with soft tissue edema anteriorly.
IMPRESSION: 1. Tricompartmental osteoarthritis greatest along the lateral
femorotibial compartment with there are scattered areas of
subchondral degenerative cystic change and/or erosions of the
lateral tibial plateau. Findings may represent a secondary sign of
full thickness chondral fissuring or loss allowing for the
erosive/degenerative change.
2. Moderate to large suprapatellar joint effusion is partially
included without fat fluid level to suggest an acute fracture.
Findings may represent a synovitis given induration of Hoffa's fat
pad.
3. Prepatellar bursal fluid consistent with bursitis or potentially
posttraumatic change/remote hematoma.
4. No acute osseous abnormality.

## 2020-08-14 ENCOUNTER — Emergency Department (HOSPITAL_COMMUNITY)
Admission: EM | Admit: 2020-08-14 | Discharge: 2020-08-15 | Disposition: A | Discharge status: Home / Self Care | Payer: Medicaid Other | Attending: Emergency Medicine | Admitting: Emergency Medicine

## 2020-08-14 ENCOUNTER — Encounter (HOSPITAL_COMMUNITY): Payer: Self-pay | Admitting: Emergency Medicine

## 2020-08-14 ENCOUNTER — Other Ambulatory Visit: Payer: Self-pay

## 2020-08-14 DIAGNOSIS — I5030 Unspecified diastolic (congestive) heart failure: Secondary | ICD-10-CM | POA: Diagnosis not present

## 2020-08-14 DIAGNOSIS — M79641 Pain in right hand: Secondary | ICD-10-CM | POA: Diagnosis present

## 2020-08-14 DIAGNOSIS — Z7901 Long term (current) use of anticoagulants: Secondary | ICD-10-CM | POA: Diagnosis not present

## 2020-08-14 DIAGNOSIS — N183 Chronic kidney disease, stage 3 unspecified: Secondary | ICD-10-CM | POA: Insufficient documentation

## 2020-08-14 DIAGNOSIS — I129 Hypertensive chronic kidney disease with stage 1 through stage 4 chronic kidney disease, or unspecified chronic kidney disease: Secondary | ICD-10-CM | POA: Insufficient documentation

## 2020-08-14 DIAGNOSIS — Z86718 Personal history of other venous thrombosis and embolism: Secondary | ICD-10-CM | POA: Diagnosis not present

## 2020-08-14 DIAGNOSIS — M10041 Idiopathic gout, right hand: Secondary | ICD-10-CM | POA: Insufficient documentation

## 2020-08-14 DIAGNOSIS — Z5321 Procedure and treatment not carried out due to patient leaving prior to being seen by health care provider: Secondary | ICD-10-CM | POA: Insufficient documentation

## 2020-08-14 DIAGNOSIS — R2231 Localized swelling, mass and lump, right upper limb: Secondary | ICD-10-CM | POA: Insufficient documentation

## 2020-08-14 NOTE — ED Triage Notes (Signed)
Pt presents with right hand swelling and pain, denies injury. Reports hx gout

## 2020-08-15 ENCOUNTER — Other Ambulatory Visit: Payer: Self-pay

## 2020-08-15 ENCOUNTER — Emergency Department (HOSPITAL_COMMUNITY)
Admission: EM | Admit: 2020-08-15 | Discharge: 2020-08-15 | Disposition: A | Discharge status: Home / Self Care | Payer: Medicaid Other | Source: Home / Self Care | Attending: Emergency Medicine | Admitting: Emergency Medicine

## 2020-08-15 ENCOUNTER — Encounter (HOSPITAL_COMMUNITY): Payer: Self-pay

## 2020-08-15 DIAGNOSIS — I129 Hypertensive chronic kidney disease with stage 1 through stage 4 chronic kidney disease, or unspecified chronic kidney disease: Secondary | ICD-10-CM | POA: Insufficient documentation

## 2020-08-15 DIAGNOSIS — Z7901 Long term (current) use of anticoagulants: Secondary | ICD-10-CM | POA: Insufficient documentation

## 2020-08-15 DIAGNOSIS — N183 Chronic kidney disease, stage 3 unspecified: Secondary | ICD-10-CM | POA: Insufficient documentation

## 2020-08-15 DIAGNOSIS — M10041 Idiopathic gout, right hand: Secondary | ICD-10-CM | POA: Insufficient documentation

## 2020-08-15 DIAGNOSIS — Z79899 Other long term (current) drug therapy: Secondary | ICD-10-CM | POA: Insufficient documentation

## 2020-08-15 DIAGNOSIS — Z86718 Personal history of other venous thrombosis and embolism: Secondary | ICD-10-CM | POA: Insufficient documentation

## 2020-08-15 DIAGNOSIS — I5020 Unspecified systolic (congestive) heart failure: Secondary | ICD-10-CM | POA: Insufficient documentation

## 2020-08-15 DIAGNOSIS — M109 Gout, unspecified: Secondary | ICD-10-CM

## 2020-08-15 DIAGNOSIS — I110 Hypertensive heart disease with heart failure: Secondary | ICD-10-CM | POA: Insufficient documentation

## 2020-08-15 MED ORDER — HYDROCODONE-ACETAMINOPHEN 5-325 MG PO TABS
1.0000 | ORAL_TABLET | Freq: Once | ORAL | Status: AC
  Administered 2020-08-15: 1 via ORAL
  Filled 2020-08-15: qty 1

## 2020-08-15 MED ORDER — HYDROCODONE-ACETAMINOPHEN 5-325 MG PO TABS: 2.0000 | ORAL_TABLET | ORAL | 0 refills | Status: DC | PRN

## 2020-08-15 MED ORDER — COLCHICINE 0.6 MG PO TABS
0.6000 mg | ORAL_TABLET | Freq: Four times a day (QID) | ORAL | 0 refills | Status: DC
Start: 2020-08-15 — End: 2020-09-26

## 2020-08-15 MED ORDER — PREDNISONE 20 MG PO TABS
20.0000 mg | ORAL_TABLET | Freq: Two times a day (BID) | ORAL | 0 refills | Status: DC
Start: 2020-08-15 — End: 2020-09-26

## 2020-08-15 NOTE — ED Notes (Signed)
Pt called for vitals no answer. °

## 2020-08-15 NOTE — ED Provider Notes (Signed)
[facility]-EMERGENCY DEPT Provider Note   CSN: [id number] Arrival date & time: 08/15/20  0347     History Chief Complaint  Patient presents with   Hand Pain    Cody [name] is a 49 y.o. male.  HPI He presents for evaluation of right hand and wrist pain, for several days, and feels that he has a gout flare.  He is not being actively treated for gout, currently.  Last gout flare was in November 2021, he was treated at the emergency department, at that time.  Aspiration of his left knee showed gout crystals.  Patient denies fever, chills, vomiting or dizziness.  There are no other known modifying factors.    Past Medical History:  Diagnosis Date   Atrial fibrillation (HCC)    CKD (chronic kidney disease), stage III (HCC)    Cody [name] 02/20/2016   DVT (deep venous thrombosis) (HCC) 01/2016   a. RLE - Dx 01/2016 in [name] - Took Xarelto for 30 days - out x 2 wks.   Dyspnea    Hypertension    Hypertensive heart disease with CHF (congestive heart failure) [facility])     Patient Active Problem List   Diagnosis Date Noted   Erectile dysfunction 12/05/2019   Acute pain of right knee 12/08/2018   Atrial fibrillation with RVR (HCC) 10/29/2018   Hypertensive urgency 10/29/2018   Persistent atrial fibrillation (HCC) 10/29/2018   Chronic atrial fibrillation (HCC) 08/02/2018   History of DVT (deep vein thrombosis) 07/27/2018   Anemia due to stage 3 chronic kidney disease (HCC) 07/27/2018   Heme positive stool 07/27/2018   Essential hypertension 07/19/2018   Chronic systolic heart failure (HCC) 07/17/2018   Obstructive sleep apnea 12/11/2016   H/O noncompliance with medical treatment, presenting hazards to health    Elevated troponin 02/20/2016   CKD (chronic kidney disease) stage 3, GFR 30-59 ml/min (HCC) 02/20/2016    Past Surgical History:  Procedure Laterality Date   NO PAST SURGERIES         Family History  Problem Relation Age of  Onset   Asthma Mother        alive and well   Hypertension Father        alive   Lung cancer Father    Cancer Father     Social History   Tobacco Use   Smoking status: Never Smoker   Smokeless tobacco: Never Used  Vaping Use   Vaping Use: Never used  Substance Use Topics   Alcohol use: No   Drug use: No    Home Medications Prior to Admission medications   Medication Sig Start Date End Date Taking? Authorizing Provider  colchicine 0.6 MG tablet Take 1 tablet (0.6 mg total) by mouth 4 (four) times daily. 08/15/20  Yes Mancel Bale, MD  HYDROcodone-acetaminophen (NORCO/VICODIN) 5-325 MG tablet Take 2 tablets by mouth every 4 (four) hours as needed for moderate pain. 08/15/20  Yes Mancel Bale, MD  predniSONE (DELTASONE) 20 MG tablet Take 1 tablet (20 mg total) by mouth 2 (two) times daily. 08/15/20  Yes Mancel Bale, MD  apixaban (ELIQUIS) 5 MG TABS tablet Take 1 tablet (5 mg total) by mouth 2 (two) times daily. 12/05/19   Patwardhan, Anabel Bene, MD  carvedilol (COREG) 25 MG tablet Take 1 tablet (25 mg total) by mouth 2 (two) times daily with a meal. 12/05/19   Patwardhan, Manish J, MD  furosemide (LASIX) 40 MG tablet Take 1 tablet (40 mg total) by mouth as  directed. 2 tablets daily, one additional tablet as needed for leg edema 12/30/19   Patwardhan, Manish J, MD  isosorbide-hydrALAZINE (BIDIL) 20-37.5 MG tablet Take 1 tablet by mouth in the morning and at bedtime. 12/05/19   Patwardhan, Manish J, MD  sacubitril-valsartan (ENTRESTO) 97-103 MG Take 1 tablet by mouth 2 (two) times daily. 06/21/20   Patwardhan, Anabel Bene, MD  sildenafil (VIAGRA) 25 MG tablet Take 1 tablet (25 mg total) by mouth daily as needed for erectile dysfunction. 12/30/19   Patwardhan, Anabel Bene, MD    Allergies    Patient has no known allergies.  Review of Systems   Review of Systems  All other systems reviewed and are negative.   Physical Exam Updated Vital Signs BP (!) 171/126    Pulse 89    Temp  98.3 F (36.8 C)    Resp 18    SpO2 98%   Physical Exam Vitals and nursing note reviewed.  Constitutional:      General: He is not in acute distress.    Appearance: He is well-developed and well-nourished. He is not ill-appearing or toxic-appearing.  HENT:     Head: Normocephalic and atraumatic.     Right Ear: External ear normal.     Left Ear: External ear normal.  Eyes:     Extraocular Movements: EOM normal.     Conjunctiva/sclera: Conjunctivae normal.     Pupils: Pupils are equal, round, and reactive to light.  Neck:     Trachea: Phonation normal.  Cardiovascular:     Rate and Rhythm: Normal rate.  Pulmonary:     Effort: Pulmonary effort is normal.  Chest:     Chest wall: No bony tenderness.  Abdominal:     General: There is no distension.  Musculoskeletal:     Cervical back: Normal range of motion and neck supple.     Comments: He guards against moving the right hand and right wrist secondary to mild pain.  There is swelling in the right hand and mild swelling in the right wrist.  Both the right hand and right wrist are mildly tender to palpation.  No tenderness or motion abnormality of the right elbow or shoulder.  Skin:    General: Skin is warm, dry and intact.  Neurological:     Mental Status: He is alert and oriented to person, place, and time.     Cranial Nerves: No cranial nerve deficit.     Sensory: No sensory deficit.     Motor: No abnormal muscle tone.     Coordination: Coordination normal.  Psychiatric:        Mood and Affect: Mood and affect and mood normal.        Behavior: Behavior normal.        Thought Content: Thought content normal.        Judgment: Judgment normal.     ED Results / Procedures / Treatments   Labs (all labs ordered are listed, but only abnormal results are displayed) Labs Reviewed - No data to display  EKG None  Radiology No results found.  Procedures Procedures (including critical care time)  Medications Ordered in  ED Medications  HYDROcodone-acetaminophen (NORCO/VICODIN) 5-325 MG per tablet 1 tablet (1 tablet Oral Given 08/15/20 0825)    ED Course  I have reviewed the triage vital signs and the nursing notes.  Pertinent labs & imaging results that were available during my care of the patient were reviewed by me and considered in my medical  decision making (see chart for details).    MDM Rules/Calculators/A&P                           Patient Vitals for the past 24 hrs:  BP Temp Pulse Resp SpO2  08/15/20 0827 (!) 171/126 -- 89 18 98 %  08/15/20 0402 (!) 187/114 98.3 F (36.8 C) 92 16 99 %    8:37 AM Reevaluation with update and discussion. After initial assessment and treatment, an updated evaluation reveals no change in status, findings discussed and questions answered. Mancel Bale   Medical Decision Making:  This patient is presenting for evaluation of recurrent gout symptoms, which does not require a range of treatment options, and is a complaint that involves a moderate risk of morbidity and mortality. The differential diagnoses include gout, strain, arthritis. I decided to review old records, and in summary patient with prior history of recurrent gout, presenting with typical, gout pain, not on suppression medications.  I did not additional historical information from anyone.   Critical Interventions-clinical evaluation, analgesic medication, discussion with patient  After These Interventions, the Patient was reevaluated and was found stable for discharge.  Patient would benefit from ongoing management by PCP for suppressive therapy.  He will be started on prednisone, colchicine and narcotic pain reliever.  CRITICAL CARE-no Performed by: Mancel Bale  Nursing Notes Reviewed/ Care Coordinated Applicable Imaging Reviewed Interpretation of Laboratory Data incorporated into ED treatment  The patient appears reasonably screened and/or stabilized for discharge and I doubt any other  medical condition or other [facility] requiring further screening, evaluation, or treatment in the ED at this time prior to discharge.  Plan: Home Medications-continue current medication; Home Treatments-rest, fluids, heat to affected area; return here if the recommended treatment, does not improve the symptoms; Recommended follow up-ECP follow-up soon as possible for further care and treatment.     Final Clinical Impression(s) / ED Diagnoses Final diagnoses:  Acute gout of right hand, unspecified cause    Rx / DC Orders ED Discharge Orders         Ordered    HYDROcodone-acetaminophen (NORCO/VICODIN) 5-325 MG tablet  Every 4 hours PRN        08/15/20 0842    colchicine 0.6 MG tablet  4 times daily        08/15/20 0842    predniSONE (DELTASONE) 20 MG tablet  2 times daily        08/15/20 0175           Mancel Bale, MD 08/15/20 [phone]

## 2020-08-15 NOTE — ED Triage Notes (Signed)
Pt reports right hand pain and swelling. Out of colchicine for gout for about a week.

## 2020-08-15 NOTE — Discharge Instructions (Signed)
Try using heat on the sore areas of your right hand and wrist.  Do not drive when taking narcotic pain medication, hydrocodone.  See your doctor for checkup as soon as possible to arrange for ongoing management and prevention of gout, with medication therapy.

## 2020-08-22 ENCOUNTER — Other Ambulatory Visit: Payer: Self-pay | Admitting: Cardiology

## 2020-08-22 MED FILL — BIDIL 20-37.5 MG TABS: 20-37.5 | 90 days supply | Qty: 180 | Fill #2

## 2020-08-23 MED FILL — predniSONE 20 MG TABS: 20 | 14 days supply | Qty: 24 | Fill #0

## 2020-08-23 MED FILL — FUROSEMIDE 40 MG TAB: 40 | 30 days supply | Qty: 60 | Fill #0

## 2020-08-24 MED FILL — ENTRESTO 97 MG-103 MG TAB: 97-103 | 30 days supply | Qty: 60 | Fill #2

## 2020-09-25 ENCOUNTER — Other Ambulatory Visit: Payer: Self-pay | Admitting: Family

## 2020-09-25 DIAGNOSIS — M109 Gout, unspecified: Secondary | ICD-10-CM

## 2020-09-26 ENCOUNTER — Encounter: Payer: Self-pay | Admitting: Cardiology

## 2020-09-26 ENCOUNTER — Other Ambulatory Visit: Payer: Self-pay | Admitting: Cardiology

## 2020-09-26 ENCOUNTER — Inpatient Hospital Stay: Payer: Medicaid Other

## 2020-09-26 ENCOUNTER — Ambulatory Visit (INDEPENDENT_AMBULATORY_CARE_PROVIDER_SITE_OTHER): Payer: Medicaid Other | Admitting: Cardiology

## 2020-09-26 ENCOUNTER — Other Ambulatory Visit: Payer: Self-pay

## 2020-09-26 VITALS — BP 129/81 | HR 80 | Temp 98.1°F | Resp 17 | Ht 73.0 in | Wt 281.0 lb

## 2020-09-26 DIAGNOSIS — I10 Essential (primary) hypertension: Secondary | ICD-10-CM

## 2020-09-26 DIAGNOSIS — I4819 Other persistent atrial fibrillation: Secondary | ICD-10-CM

## 2020-09-26 DIAGNOSIS — I5022 Chronic systolic (congestive) heart failure: Secondary | ICD-10-CM

## 2020-09-26 DIAGNOSIS — M1A9XX0 Chronic gout, unspecified, without tophus (tophi): Secondary | ICD-10-CM

## 2020-09-26 MED ORDER — METOPROLOL SUCCINATE ER 100 MG PO TB24: 100.0000 mg | ORAL_TABLET | Freq: Every day | ORAL | 3 refills | Status: DC

## 2020-09-26 MED ORDER — FUROSEMIDE 40 MG PO TABS: 40.0000 mg | ORAL_TABLET | ORAL | 2 refills | Status: DC

## 2020-09-26 MED ORDER — ISOSORB DINITRATE-HYDRALAZINE 20-37.5 MG PO TABS: 1.0000 | ORAL_TABLET | Freq: Two times a day (BID) | ORAL | 2 refills | Status: DC

## 2020-09-26 MED ORDER — COLCHICINE 0.6 MG PO TABS: 0.6000 mg | ORAL_TABLET | Freq: Two times a day (BID) | ORAL | 1 refills | Status: DC

## 2020-09-26 MED ORDER — ENTRESTO 97-103 MG PO TABS: 1.0000 | ORAL_TABLET | Freq: Two times a day (BID) | ORAL | 3 refills | Status: DC

## 2020-09-26 MED ORDER — APIXABAN 5 MG PO TABS: 5.0000 mg | ORAL_TABLET | Freq: Two times a day (BID) | ORAL | 2 refills | Status: DC

## 2020-09-26 MED FILL — FUROSEMIDE 40 MG TAB: 40 | 90 days supply | Qty: 180 | Fill #0

## 2020-09-26 MED FILL — ENTRESTO 97 MG-103 MG TAB: 97-103 | 30 days supply | Qty: 60 | Fill #0

## 2020-09-26 MED FILL — MITIGARE 0.6 MG CAPS: 0.6 | 30 days supply | Qty: 60 | Fill #0

## 2020-09-26 MED FILL — ELIQUIS 5 MG TABLET: 5 | 30 days supply | Qty: 60 | Fill #0

## 2020-09-26 MED FILL — METOPROLOL SUCCINATE ER 100: 100 | 30 days supply | Qty: 30 | Fill #0

## 2020-09-26 NOTE — Progress Notes (Signed)
Follow up visit  Subjective:   Cody [name], male    DOB: 02-Dec-1970, 50 y.o.   MRN: [id number]   Chief complaint: Cardiomyopathy  50 y.o. African American male  with hypertension, possible OSA, h/o RLE DVT (2017), persistent Afib, CKD3, HFrEF.  Recently, he has had episdoes of palpitations, usually at night. His dyspnea and leg edema is are controled with lasix. He has not been taking eliquis. His biggest complaint is ankle pain that he attributes to gout. He continues to take Aleve, 2 tabs of 220 mg almost daily. He requests me to prescribe him colchicine, has not seen his PCP in a while.    Current Outpatient Medications on File Prior to Visit  Medication Sig Dispense Refill  . apixaban (ELIQUIS) 5 MG TABS tablet Take 1 tablet (5 mg total) by mouth 2 (two) times daily. 60 tablet 2  . carvedilol (COREG) 25 MG tablet Take 1 tablet (25 mg total) by mouth 2 (two) times daily with a meal. 180 tablet 2  . colchicine 0.6 MG tablet Take 1 tablet (0.6 mg total) by mouth 4 (four) times daily. 30 tablet 0  . furosemide (LASIX) 40 MG tablet Take 1 tablet (40 mg total) by mouth as directed. 2 tablets daily, one additional tablet as needed for leg edema 180 tablet 2  . HYDROcodone-acetaminophen (NORCO/VICODIN) 5-325 MG tablet Take 2 tablets by mouth every 4 (four) hours as needed for moderate pain. 15 tablet 0  . isosorbide-hydrALAZINE (BIDIL) 20-37.5 MG tablet Take 1 tablet by mouth in the morning and at bedtime. 180 tablet 2  . predniSONE (DELTASONE) 20 MG tablet Take 1 tablet (20 mg total) by mouth 2 (two) times daily. 10 tablet 0  . sacubitril-valsartan (ENTRESTO) 97-103 MG Take 1 tablet by mouth 2 (two) times daily. 180 tablet 3  . sildenafil (VIAGRA) 25 MG tablet Take 1 tablet (25 mg total) by mouth daily as needed for erectile dysfunction. 10 tablet 2   No current facility-administered medications on file prior to visit.    Cardiovascular studies:  EKG 09/26/2020: Atrial fibrillation 86  bpm  Left bundle branch block  Echocardiogram 09/22/2019:  Left ventricle cavity is normal in size. Severe concentric hypertrophy of  the left ventricle. Severe global hypokinesis with severely depressed LV  systolic function with EF 20-25%. Unable to evaluate diastolic function  due to atrial fibrillation.  Left atrial cavity is severely dilated.  Trileaflet aortic valve with no regurgitation. Trace aortic stenosis.  Moderate (Grade II) mitral regurgitation.  IVC is dilated with respiratory variation. Estimated RA pressure 8 mmHg.  No significant change compared to previous study on 10/29/2018.   EKG 12/07/2018: Atrial fibrillation with controlled ventricular rate Left bundle branch block.   EKG 11/04/2018: Afib w/RVR. IVCD  LE [place] 11/02/2018: Right: There is no evidence of deep vein thrombosis in the lower extremity. No cystic structure found in the popliteal fossa.  Recent labs: 11/14/2019: Glucose 83, BUN/Cr 20/1.65. EGFR 48. Na/K 148/4.2.   12/14/2018: H/H 9.9/32.1. MCV 81. Platelets 303  Review of Systems  Cardiovascular: Negative for chest pain, dyspnea on exertion, leg swelling, palpitations and syncope.  Genitourinary:       Erectile dysfunction    Vitals:   09/26/20 1013  BP: 129/81  Pulse: 80  Resp: 17  Temp: 98.1 F (36.7 C)  SpO2: 98%      Objective:   Physical Exam Vitals and nursing note reviewed.  Constitutional:      Appearance: He is well-developed.  Neck:     Vascular: No JVD.  Cardiovascular:     Rate and Rhythm: Normal rate. Rhythm irregularly irregular.     Pulses: Intact distal pulses.     Heart sounds: Normal heart sounds. No murmur heard.   Pulmonary:     Effort: Pulmonary effort is normal.     Breath sounds: Normal breath sounds. No wheezing or rales.            Assessment & Recommendations:   50 y.o. African American male  with hypertension, possible OSA, h/o RLE DVT (2017), persistent Afib, CKD3, HFrEF, gout  Chronic  systolic heart failure ([place]) Etiology unclear. NYHA class I-II symptoms He has opted not to undergo stress test in the past. He does not have any angina symptoms at this time to recommend cardiac catheterization.  He has had erectile dysfunction symptoms. Switch carvedilol to metoprolol succinate 100 mg daily. Contine Entresto 97-103 mg bid, BiDil 20-37.5 mg,  lasix at 80 mg daily, with additional 40 mg as needed for leg edema.   Unable to use spironolactone or Farxiga due to renal dysfunction. Consult regarding low-salt diet.   Again emphasized importance of avoiding NSAIDS.  Prescribed colchicine for now for gout. Recommend f/u w/PCP.  Persistent atrial fibrillation Recommend cardiac telemetry to evaluate his episodes of palpitations CHA2DS2VASc score 3. Annual stroke risk 4% Resume Eiquis 5 mg bid.   Essential hypertension Well controlled.   Erectile dysfunction: At this point, this is patient's biggest concern and medical issue affecting his quality of life.  Etiology for his erectile dysfunction includes is chronic systolic heart failure, as well as ongoing use of beta-blockers.  Without beta-blockers, he will almost certainly revert back to decompensated heart failure.  Use of PDE 5 inhibitors is limited due to ongoing use of BiDil.  I had a long discussion with the patient regarding risks of concurrent use of BiDil and PDE 5 inhibitor.  Patient requested that he would like to use sildenafil at least once a month.  I have explained to the patient that the only way he could use sildenafil is by not using BiDil for at least 3 days while using sildenafil.  Patient verbalized understanding of cautious use of sildenafil.  While this is not optimal from medical management of his heart failure, this probably remains the only way to treat his erectile dysfunction.   F/u in 4 weeks  [place], MD [facility] Pager: [phone] Office: [phone] If no answer Cell  [phone]

## 2020-10-24 ENCOUNTER — Ambulatory Visit: Payer: Medicaid Other | Admitting: Cardiology

## 2020-10-24 ENCOUNTER — Encounter: Payer: Self-pay | Admitting: Cardiology

## 2020-10-24 ENCOUNTER — Other Ambulatory Visit: Payer: Self-pay

## 2020-10-24 ENCOUNTER — Other Ambulatory Visit: Payer: Self-pay | Admitting: Cardiology

## 2020-10-24 VITALS — BP 154/128 | HR 89 | Temp 97.8°F | Resp 16 | Ht 73.0 in | Wt 297.0 lb

## 2020-10-24 DIAGNOSIS — I5022 Chronic systolic (congestive) heart failure: Secondary | ICD-10-CM

## 2020-10-24 DIAGNOSIS — I4819 Other persistent atrial fibrillation: Secondary | ICD-10-CM

## 2020-10-24 DIAGNOSIS — I10 Essential (primary) hypertension: Secondary | ICD-10-CM

## 2020-10-24 MED ORDER — ISOSORB DINITRATE-HYDRALAZINE 20-37.5 MG PO TABS: 1.0000 | ORAL_TABLET | Freq: Two times a day (BID) | ORAL | 2 refills | Status: DC

## 2020-10-24 MED ORDER — FUROSEMIDE 40 MG PO TABS: 40.0000 mg | ORAL_TABLET | ORAL | 2 refills | Status: DC

## 2020-10-24 MED ORDER — ENTRESTO 97-103 MG PO TABS: 1.0000 | ORAL_TABLET | Freq: Two times a day (BID) | ORAL | 3 refills | Status: DC

## 2020-10-24 MED ORDER — ISOSORB DINITRATE-HYDRALAZINE 20-37.5 MG PO TABS: 1.0000 | ORAL_TABLET | Freq: Three times a day (TID) | ORAL | 3 refills | Status: DC

## 2020-10-24 MED ORDER — BISOPROLOL FUMARATE 5 MG PO TABS
5.0000 mg | ORAL_TABLET | Freq: Every day | ORAL | 2 refills | Status: DC
Start: 2020-10-24 — End: 2020-10-24

## 2020-10-24 MED FILL — BIDIL 20-37.5 MG TABS: 20-37.5 | 90 days supply | Qty: 270 | Fill #0

## 2020-10-24 MED FILL — BISOPROLOL FUMARATE 5 MG TA: 5 | 90 days supply | Qty: 90 | Fill #0

## 2020-10-24 NOTE — Progress Notes (Signed)
Follow up visit  Subjective:   Cody [name], male    DOB: 1971/02/14, 50 y.o.   MRN: [id number]   Chief complaint: Cardiomyopathy  50 y.o. African American male  with hypertension, possible OSA, h/o RLE DVT (2017), persistent Afib, CKD3, HFrEF.  Patient has been out of his metoprolol and Bidil for last few days. He feels he does not tolerate metoprolol. Exertional dyspnea is stable. Leg edema is well controlled with lasix.    Current Outpatient Medications on File Prior to Visit  Medication Sig Dispense Refill  . apixaban (ELIQUIS) 5 MG TABS tablet Take 1 tablet (5 mg total) by mouth 2 (two) times daily. 180 tablet 2  . furosemide (LASIX) 40 MG tablet Take 1 tablet (40 mg total) by mouth as directed. 2 tablets daily, one additional tablet as needed for leg edema 180 tablet 2  . isosorbide-hydrALAZINE (BIDIL) 20-37.5 MG tablet Take 1 tablet by mouth in the morning and at bedtime. 180 tablet 2  . metoprolol succinate (TOPROL-XL) 100 MG 24 hr tablet Take 1 tablet (100 mg total) by mouth daily. Take with or immediately following a meal. 30 tablet 3  . sacubitril-valsartan (ENTRESTO) 97-103 MG Take 1 tablet by mouth 2 (two) times daily. 180 tablet 3  . spironolactone (ALDACTONE) 25 MG tablet Take 1 tablet by mouth daily.    [name] MITIGARE 0.6 MG CAPS Take 1 capsule by mouth 2 (two) times daily.     No current facility-administered medications on file prior to visit.    Cardiovascular studies:  EKG 09/26/2020: Atrial fibrillation 86 bpm  Left bundle branch block  Echocardiogram 09/22/2019:  Left ventricle cavity is normal in size. Severe concentric hypertrophy of  the left ventricle. Severe global hypokinesis with severely depressed LV  systolic function with EF 20-25%. Unable to evaluate diastolic function  due to atrial fibrillation.  Left atrial cavity is severely dilated.  Trileaflet aortic valve with no regurgitation. Trace aortic stenosis.  Moderate (Grade II) mitral regurgitation.   IVC is dilated with respiratory variation. Estimated RA pressure 8 mmHg.  No significant change compared to previous study on 10/29/2018.   EKG 12/07/2018: Atrial fibrillation with controlled ventricular rate Left bundle branch block.   EKG 11/04/2018: Afib w/RVR. IVCD  LE [place] 11/02/2018: Right: There is no evidence of deep vein thrombosis in the lower extremity. No cystic structure found in the popliteal fossa.  Recent labs: 11/14/2019: Glucose 83, BUN/Cr 20/1.65. EGFR 48. Na/K 148/4.2.   12/14/2018: H/H 9.9/32.1. MCV 81. Platelets 303  Review of Systems  Cardiovascular: Negative for chest pain, dyspnea on exertion, leg swelling, palpitations and syncope.  Genitourinary:       Erectile dysfunction     Vitals:   10/24/20 1429 10/24/20 1433  BP: (!) 162/118 (!) 154/128  Pulse: 90 89  Resp: 16   Temp: 97.8 F (36.6 C)   SpO2: 98%       Objective:   Physical Exam Vitals and nursing note reviewed.  Constitutional:      Appearance: He is well-developed.  Neck:     Vascular: No JVD.  Cardiovascular:     Rate and Rhythm: Normal rate. Rhythm irregularly irregular.     Pulses: Intact distal pulses.     Heart sounds: Normal heart sounds. No murmur heard.   Pulmonary:     Effort: Pulmonary effort is normal.     Breath sounds: Normal breath sounds. No wheezing or rales.  Assessment & Recommendations:   50 y.o. African American male  with hypertension, possible OSA, h/o RLE DVT (2017), persistent Afib, CKD3, HFrEF, gout  Chronic systolic heart failure ([place]) Etiology unclear. NYHA class I-II symptoms He has opted not to undergo stress test in the past. He does not have any angina symptoms at this time to recommend cardiac catheterization.  Switch metoprolol to bisoprolol 5 mg daily. Contine Entresto 97-103 mg bid, BiDil 20-37.5 mg,  lasix at 80 mg daily, with additional 40 mg as needed for leg edema.   Avoid spironolactone or Farxiga due to renal  dysfunction. Recommend low-salt diet.   Again emphasized importance of avoiding NSAIDS.  Prescribed colchicine for now for gout. Recommend f/u w/PCP.  Persistent atrial fibrillation Recommend cardiac telemetry to evaluate his episodes of palpitations CHA2DS2VASc score 3. Annual stroke risk 4% Resume Eiquis 5 mg bid.   Essential hypertension Well controlled.   Erectile dysfunction: At this point, this is patient's biggest concern and medical issue affecting his quality of life.  Etiology for his erectile dysfunction includes is chronic systolic heart failure, as well as ongoing use of beta-blockers.  Without beta-blockers, he will almost certainly revert back to decompensated heart failure.  Use of PDE 5 inhibitors is limited due to ongoing use of BiDil.  I had a long discussion with the patient regarding risks of concurrent use of BiDil and PDE 5 inhibitor.  Patient requested that he would like to use sildenafil at least once a month.  I have explained to the patient that the only way he could use sildenafil is by not using BiDil for at least 3 days while using sildenafil.  Patient verbalized understanding of cautious use of sildenafil.  While this is not optimal from medical management of his heart failure, this probably remains the only way to treat his erectile dysfunction.   F/u in 3 months  [place], MD [facility] Pager: [phone] Office: [phone] If no answer Cell [phone]

## 2020-11-17 ENCOUNTER — Other Ambulatory Visit: Payer: Self-pay

## 2020-11-21 ENCOUNTER — Other Ambulatory Visit: Payer: Self-pay | Admitting: Pharmacist

## 2020-11-21 ENCOUNTER — Other Ambulatory Visit: Payer: Self-pay | Admitting: Cardiology

## 2020-11-21 ENCOUNTER — Other Ambulatory Visit: Payer: Self-pay

## 2020-11-21 DIAGNOSIS — I5022 Chronic systolic (congestive) heart failure: Secondary | ICD-10-CM

## 2020-11-21 MED ORDER — POTASSIUM CHLORIDE ER 20 MEQ PO TBCR
1.0000 | EXTENDED_RELEASE_TABLET | Freq: Two times a day (BID) | ORAL | 2 refills | Status: DC
  Filled 2020-11-21: qty 60, 30d supply, fill #0
  Filled 2021-02-25: qty 100, 50d supply, fill #0

## 2020-11-21 MED ORDER — FUROSEMIDE 40 MG PO TABS
80.0000 mg | ORAL_TABLET | Freq: Every day | ORAL | 0 refills | Status: DC
  Filled 2020-11-21: qty 90, 45d supply, fill #0

## 2020-11-21 MED ORDER — FUROSEMIDE 40 MG PO TABS
80.0000 mg | ORAL_TABLET | Freq: Every day | ORAL | 2 refills | Status: DC
  Filled 2020-11-21: qty 100, 50d supply, fill #0
  Filled 2020-11-22: qty 100, 33d supply, fill #0
  Filled 2020-11-22: qty 100, 25d supply, fill #0
  Filled 2021-01-15: qty 100, 33d supply, fill #1
  Filled 2021-02-25: qty 100, 50d supply, fill #2

## 2020-11-21 MED FILL — Furosemide Tab 40 MG: ORAL | 30 days supply | Qty: 30 | Fill #0 | Status: CN

## 2020-11-21 NOTE — Addendum Note (Signed)
Addended by: Cassell Clement T on: 11/21/2020 10:21 AM   Modules accepted: Orders

## 2020-11-22 ENCOUNTER — Other Ambulatory Visit: Payer: Self-pay

## 2020-11-28 ENCOUNTER — Other Ambulatory Visit: Payer: Self-pay

## 2020-12-06 ENCOUNTER — Other Ambulatory Visit: Payer: Self-pay

## 2020-12-06 ENCOUNTER — Other Ambulatory Visit: Payer: Self-pay | Admitting: Cardiology

## 2020-12-06 MED ORDER — COLCHICINE 0.6 MG PO CAPS
1.0000 | ORAL_CAPSULE | Freq: Two times a day (BID) | ORAL | 1 refills | Status: DC
  Filled 2020-12-06: qty 60, 30d supply, fill #0
  Filled 2021-01-15: qty 60, 30d supply, fill #1

## 2020-12-06 MED FILL — Sacubitril-Valsartan Tab 97-103 MG: ORAL | 90 days supply | Qty: 180 | Fill #0 | Status: AC

## 2020-12-07 ENCOUNTER — Other Ambulatory Visit: Payer: Self-pay

## 2020-12-17 ENCOUNTER — Other Ambulatory Visit: Payer: Self-pay

## 2020-12-17 ENCOUNTER — Other Ambulatory Visit: Payer: Self-pay | Admitting: Cardiology

## 2020-12-17 DIAGNOSIS — I5022 Chronic systolic (congestive) heart failure: Secondary | ICD-10-CM

## 2020-12-17 DIAGNOSIS — I10 Essential (primary) hypertension: Secondary | ICD-10-CM

## 2020-12-17 DIAGNOSIS — I4819 Other persistent atrial fibrillation: Secondary | ICD-10-CM

## 2020-12-17 MED FILL — Bisoprolol Fumarate Tab 5 MG: ORAL | 30 days supply | Qty: 30 | Fill #0 | Status: AC

## 2021-01-15 ENCOUNTER — Other Ambulatory Visit: Payer: Self-pay

## 2021-01-24 ENCOUNTER — Ambulatory Visit: Payer: Medicaid Other | Admitting: Cardiology

## 2021-02-15 ENCOUNTER — Ambulatory Visit: Payer: Medicaid Other | Admitting: Cardiology

## 2021-02-25 ENCOUNTER — Other Ambulatory Visit: Payer: Self-pay

## 2021-02-25 ENCOUNTER — Other Ambulatory Visit: Payer: Self-pay | Admitting: Cardiology

## 2021-02-25 DIAGNOSIS — I5022 Chronic systolic (congestive) heart failure: Secondary | ICD-10-CM

## 2021-02-25 DIAGNOSIS — I4819 Other persistent atrial fibrillation: Secondary | ICD-10-CM

## 2021-02-25 DIAGNOSIS — I10 Essential (primary) hypertension: Secondary | ICD-10-CM

## 2021-02-25 MED ORDER — COLCHICINE 0.6 MG PO CAPS
1.0000 | ORAL_CAPSULE | Freq: Two times a day (BID) | ORAL | 1 refills | Status: DC
  Filled 2021-02-25: qty 60, 30d supply, fill #0

## 2021-02-25 MED FILL — Apixaban Tab 5 MG: ORAL | 90 days supply | Qty: 180 | Fill #0 | Status: CN

## 2021-02-25 MED FILL — Isosorbide Dinitrate-Hydralazine HCl Tab 20-37.5 MG: ORAL | 90 days supply | Qty: 270 | Fill #0 | Status: AC

## 2021-02-25 MED FILL — Bisoprolol Fumarate Tab 5 MG: ORAL | 90 days supply | Qty: 90 | Fill #1 | Status: AC

## 2021-03-03 NOTE — Progress Notes (Signed)
Established Patient Office Visit  Subjective:  Patient ID: Cody [name], male    DOB: 1971/02/17  Age: 50 y.o. MRN: [id number] Virtual Visit via Telephone Note  I connected with Cody [name] on 03/04/21 at  8:30 AM EDT by telephone and verified that I am speaking with the correct person using two identifiers.   Consent:  I discussed the limitations, risks, security and privacy concerns of performing an evaluation and management service by telephone and the availability of in person appointments. I also discussed with the patient that there may be a patient responsible charge related to this service. The patient expressed understanding and agreed to proceed.  Location of patient: Patient is at home  Location of provider: I am in my office  Persons participating in the televisit with the patient.   No one else on the call    History of Present Illness: CC: Reestablish primary care   HPI Cody [name] presents for to reestablish for primary care.  This is a telephone visit and the patient did not have technology for video.  Patient was last seen in this clinic in 2019 and has not been seen in follow-up since.  The patient has required emergency room visits and then eventually can connected with cardiology and has been following with cardiology for atrial fibrillation and systolic diastolic heart failure hypertensive urgency and poorly controlled hypertension.  Patient also has chronic kidney disease stage III recurrent gout.  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  His diet is variable.  He occasionally eats fried foods but mostly eats baked chicken and other beef products.  His aunt prepares the meals for him.  Patient has had recurrent flares of gout in the hand and also in the lower extremities.  He states that shortness of breath is  stable but he just has no energy particularly tries to lift something heavy more than 10 pounds.  He denies any chest pain.  He sleeps well but does have to get up with nocturia.  He is on furosemide this is helped with lower extremity edema.  He denies any cough.  He does have erectile dysfunction.  He denies any testicular pain or scrotal swelling.  He has no penile discharge.  He is uncircumcised.  He has no hernias.  He has no hematuria.  Cardiology gave him a low-dose of Viagra which she is tried with minimal effectiveness.  Is problematic for him to take Viagra and that he is on Entresto along with BiDil.  He takes colchicine on a scheduled basis.  [facility] with a blood pressure measured at 140/84.  Patient has no other specific complaints at this time.  He prefers to use our pharmacy.  He just got all of his medicines refilled at our pharmacy.  Past Medical History:  Diagnosis Date   Atrial fibrillation (HCC)    CKD (chronic kidney disease), stage III (HCC)    [name] 02/20/2016   DVT (deep venous thrombosis) (HCC) 01/2016   a. RLE - Dx 01/2016 in [name] - Took Xarelto for 30 days - out x 2 wks.   Dyspnea    Hypertension    Hypertensive heart disease with CHF (congestive heart failure) (HCC)     Past Surgical History:  Procedure Laterality Date   NO  PAST SURGERIES      Family History  Problem Relation Age of Onset   Asthma Mother        alive and well   Hypertension Father        alive   Lung cancer Father    Cancer Father     Social History   Socioeconomic History   Marital status: Legally Separated    Spouse name: Not on file   Number of children: 1   Years of education: Not on file   Highest education level: Not on file  Occupational History   Not on file  Tobacco Use   Smoking status: Never   Smokeless tobacco: Never  Vaping Use   Vaping Use: Never used  Substance and Sexual Activity   Alcohol use: No   Drug use: No   Sexual activity: Yes   Other Topics Concern   Not on file  Social History Narrative   Lives in [place].  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   Social Determinants of Health   Financial Resource Strain: Not on file  Food Insecurity: Not on file  Transportation Needs: Not on file  Physical Activity: Not on file  Stress: Not on file  Social Connections: Not on file  Intimate Partner Violence: Not on file    Outpatient Medications Prior to Visit  Medication Sig Dispense Refill   apixaban (ELIQUIS) 5 MG TABS tablet TAKE 1 TABLET (5 MG TOTAL) BY MOUTH 2 (TWO) TIMES DAILY. 180 tablet 2   bisoprolol (ZEBETA) 5 MG tablet TAKE 1 TABLET (5 MG TOTAL) BY MOUTH DAILY. 90 tablet 2   Colchicine (MITIGARE) 0.6 MG CAPS TAKE 1 CAPSULE BY MOUTH TWICE DAILY. 60 capsule 1   furosemide (LASIX) 40 MG tablet Take 2 tablets (80 mg total) by mouth daily. Take additional 40 mg as needed for leg edema. 100 tablet 2   isosorbide-hydrALAZINE (BIDIL) 20-37.5 MG tablet TAKE 1 TABLET BY MOUTH 3 (THREE) TIMES DAILY. 270 tablet 3   Potassium Chloride ER (K-TAB) 20 MEQ TBCR Take 1 tablet by mouth 2 (two) times daily. Take with your water pill. 100 tablet 2   sacubitril-valsartan (ENTRESTO) 97-103 MG TAKE 1 TABLET BY MOUTH 2 (TWO) TIMES DAILY. 180 tablet 3   No facility-administered medications prior to visit.    No Known Allergies  ROS Review of Systems  Constitutional:  Positive for fatigue.  HENT: Negative.    Respiratory: Negative.    Cardiovascular: Negative.   Gastrointestinal: Negative.   Genitourinary:  Positive for enuresis. Negative for decreased urine volume, difficulty urinating, dysuria, flank pain, frequency, genital sores, hematuria, penile discharge, penile pain, penile swelling, scrotal swelling, testicular pain and urgency.       ED  Musculoskeletal:  Positive for arthralgias. Negative for myalgias.  Skin: Negative.   Neurological: Negative.   Hematological: Negative.    Psychiatric/Behavioral: Negative.       Objective:    Physical Exam  There were no vitals taken for this visit. Wt Readings from Last 3 Encounters:  10/24/20 297 lb (134.7 kg)  09/26/20 281 lb (127.5 kg)  06/22/20 270 lb (122.5 kg)   No exam this is a phone visit  Health Maintenance Due  Topic Date Due   COVID-19 Vaccine (1) Never done   Hepatitis C Screening  Never done   TETANUS/TDAP  Never done   COLONOSCOPY (Pts 45-24yrs Insurance coverage will need to be confirmed)  Never done  There are no preventive care reminders to display for this patient.  Lab Results  Component Value Date   TSH 1.474 10/29/2018   Lab Results  Component Value Date   WBC 7.8 12/14/2018   HGB 9.9 (L) 12/14/2018   HCT 32.1 (L) 12/14/2018   MCV 81.3 12/14/2018   PLT 303 12/14/2018   Lab Results  Component Value Date   NA 144 11/14/2019   K 4.2 11/14/2019   CO2 27 11/14/2019   GLUCOSE 83 11/14/2019   BUN 20 11/14/2019   CREATININE 1.65 (H) 11/14/2019   BILITOT 1.1 11/04/2018   ALKPHOS 85 11/04/2018   AST 21 11/04/2018   ALT 20 11/04/2018   PROT 6.7 11/04/2018   ALBUMIN 3.5 11/04/2018   CALCIUM 9.7 11/14/2019   ANIONGAP 13 12/14/2018   No results found for: CHOL No results found for: HDL No results found for: LDLCALC No results found for: TRIG No results found for: CHOLHDL Lab Results  Component Value Date   HGBA1C 6.0 (H) 01/27/2017      Assessment & Plan:   Problem List Items Addressed This Visit       Cardiovascular and Mediastinum   Chronic systolic heart failure (HCC)    Chronic systolic heart failure secondary to chronic recurrent hypertension and chronic atrial fibrillation  Plan per cardiology We will continue with Entresto and BiDil furosemide and Zebeta as prescribed refills will be issued also potassium supplementation will be refilled  We will bring the patient in for a face-to-face visit in early August along with complete blood draws as transportation  is difficult for them to make 2 separate appointments  Patient advised to increase improve hydration while at work and to take a healthy diet with him to work  We also reviewed with the patient healthy diet in general       Essential hypertension    Patient is likely not at goal with blood pressure he states his [facility]80 for the last blood pressures measured in March of this year he was 160/118  We will bring the patient in for a direct exam and blood pressure reading in short-term       Chronic atrial fibrillation (HCC)    Continue Zebeta and also continue apixaban         Respiratory   Obstructive sleep apnea    History of sleep apnea but the patient's not had formal testing and does not have a CPAP machine         Musculoskeletal and Integument   Chronic gout involving toe without tophus    Chronic recurrent gout I do not have any uric acids in the system will need to bring him in for repeat exam I told the patient did not use colchicine chronically we may get him on allopurinol after we have had his assessment of the uric acid levels  Patient does have Medicaid can consider referral to rheumatology as well         Genitourinary   CKD (chronic kidney disease) stage 3, GFR 30-59 ml/min (HCC)    Need to reassess patient's renal function we will bring patient in for direct exam         Other   Heme positive stool    Prior history of heme positive stool on colon cancer screening will need to bring the patient in for direct exam to see if this is persisting since he is on apixaban and  also recheck blood counts       Erectile dysfunction    History of erectile dysfunction will plan to refer to urology after of had a chance to assess this patient medically       Chronic anticoagulation    Chronic anticoagulation on apixaban due to chronic atrial fibrillation will reassess blood counts at the next visit        No  orders of the defined types were placed in this encounter.  Follow Up Instructions: Patient knows a direct office exam will occur in August at which time lab work will occur note patient has transportation barriers and needs to get a ride to come to the office.   I discussed the assessment and treatment plan with the patient. The patient was provided an opportunity to ask questions and all were answered. The patient agreed with the plan and demonstrated an understanding of the instructions.   The patient was advised to call back or seek an in-person evaluation if the symptoms worsen or if the condition fails to improve as anticipated.  I provided 28 minutes of non-face-to-face time during this encounter  including  median intraservice time , review of notes, labs, imaging, medications  and explaining diagnosis and management to the patient .       Follow-up: Return in about 3 weeks (around 03/25/2021).    Shan Levans, MD

## 2021-03-04 ENCOUNTER — Telehealth: Payer: Self-pay | Admitting: Critical Care Medicine

## 2021-03-04 ENCOUNTER — Other Ambulatory Visit: Payer: Self-pay

## 2021-03-04 ENCOUNTER — Ambulatory Visit: Payer: Medicaid Other | Attending: Critical Care Medicine | Admitting: Critical Care Medicine

## 2021-03-04 ENCOUNTER — Encounter: Payer: Self-pay | Admitting: Critical Care Medicine

## 2021-03-04 DIAGNOSIS — I482 Chronic atrial fibrillation, unspecified: Secondary | ICD-10-CM | POA: Diagnosis not present

## 2021-03-04 DIAGNOSIS — Z7901 Long term (current) use of anticoagulants: Secondary | ICD-10-CM

## 2021-03-04 DIAGNOSIS — I5022 Chronic systolic (congestive) heart failure: Secondary | ICD-10-CM

## 2021-03-04 DIAGNOSIS — I10 Essential (primary) hypertension: Secondary | ICD-10-CM

## 2021-03-04 DIAGNOSIS — I4819 Other persistent atrial fibrillation: Secondary | ICD-10-CM

## 2021-03-04 DIAGNOSIS — G4733 Obstructive sleep apnea (adult) (pediatric): Secondary | ICD-10-CM

## 2021-03-04 DIAGNOSIS — N529 Male erectile dysfunction, unspecified: Secondary | ICD-10-CM

## 2021-03-04 DIAGNOSIS — M1A9XX0 Chronic gout, unspecified, without tophus (tophi): Secondary | ICD-10-CM

## 2021-03-04 DIAGNOSIS — R195 Other fecal abnormalities: Secondary | ICD-10-CM

## 2021-03-04 DIAGNOSIS — N1832 Chronic kidney disease, stage 3b: Secondary | ICD-10-CM

## 2021-03-04 MED ORDER — BISOPROLOL FUMARATE 5 MG PO TABS
ORAL_TABLET | Freq: Every day | ORAL | 2 refills | Status: DC
  Filled 2021-03-04: qty 90, fill #0
  Filled 2021-06-10: qty 90, 90d supply, fill #0

## 2021-03-04 MED ORDER — COLCHICINE 0.6 MG PO CAPS
1.0000 | ORAL_CAPSULE | Freq: Two times a day (BID) | ORAL | 1 refills | Status: AC | PRN
  Filled 2021-03-04: qty 60, fill #0
  Filled 2021-03-20: qty 60, 30d supply, fill #0
  Filled 2021-05-14: qty 60, 30d supply, fill #1

## 2021-03-04 MED ORDER — SACUBITRIL-VALSARTAN 97-103 MG PO TABS
1.0000 | ORAL_TABLET | Freq: Two times a day (BID) | ORAL | 3 refills | Status: DC
  Filled 2021-03-04 – 2021-03-20 (×2): qty 180, 90d supply, fill #0
  Filled 2021-07-26: qty 180, 90d supply, fill #1

## 2021-03-04 MED ORDER — POTASSIUM CHLORIDE ER 20 MEQ PO TBCR
1.0000 | EXTENDED_RELEASE_TABLET | Freq: Two times a day (BID) | ORAL | 2 refills | Status: DC
  Filled 2021-03-04: qty 100, 50d supply, fill #0

## 2021-03-04 MED ORDER — ISOSORB DINITRATE-HYDRALAZINE 20-37.5 MG PO TABS
1.0000 | ORAL_TABLET | Freq: Three times a day (TID) | ORAL | 3 refills | Status: DC
  Filled 2021-03-04 – 2021-07-26 (×2): qty 270, 90d supply, fill #0

## 2021-03-04 MED ORDER — FUROSEMIDE 40 MG PO TABS
80.0000 mg | ORAL_TABLET | Freq: Every day | ORAL | 2 refills | Status: DC
  Filled 2021-03-04 – 2021-06-10 (×2): qty 100, 50d supply, fill #0

## 2021-03-04 MED ORDER — APIXABAN 5 MG PO TABS
ORAL_TABLET | Freq: Two times a day (BID) | ORAL | 2 refills | Status: DC
  Filled 2021-03-04: qty 180, 90d supply, fill #0

## 2021-03-04 NOTE — Telephone Encounter (Signed)
Called patient and LVM to return call and schedule his follow up appointments. If patient calls back please schedule appointments.

## 2021-03-04 NOTE — Telephone Encounter (Signed)
-----   Message from Storm Frisk, MD sent at 03/04/2021 10:51 AM EDT ----- This pt needs lab visit next week on Monday for lab draws and then see me in Aug on a Monday face to face ok to double

## 2021-03-04 NOTE — Assessment & Plan Note (Signed)
Chronic recurrent gout I do not have any uric acids in the system will need to bring him in for repeat exam I told the patient did not use colchicine chronically we may get him on allopurinol after we have had his assessment of the uric acid levels  Patient does have Medicaid can consider referral to rheumatology as well

## 2021-03-04 NOTE — Assessment & Plan Note (Signed)
History of sleep apnea but the patient's not had formal testing and does not have a CPAP machine

## 2021-03-04 NOTE — Assessment & Plan Note (Signed)
Continue Zebeta and also continue apixaban

## 2021-03-04 NOTE — Assessment & Plan Note (Signed)
Chronic anticoagulation on apixaban due to chronic atrial fibrillation will reassess blood counts at the next visit

## 2021-03-04 NOTE — Assessment & Plan Note (Signed)
Prior history of heme positive stool on colon cancer screening will need to bring the patient in for direct exam to see if this is persisting since he is on apixaban and also recheck blood counts

## 2021-03-04 NOTE — Assessment & Plan Note (Signed)
Need to reassess patient's renal function we will bring patient in for direct exam

## 2021-03-04 NOTE — Assessment & Plan Note (Signed)
History of erectile dysfunction will plan to refer to urology after of had a chance to assess this patient medically

## 2021-03-04 NOTE — Assessment & Plan Note (Signed)
Chronic systolic heart failure secondary to chronic recurrent hypertension and chronic atrial fibrillation  Plan per cardiology We will continue with Entresto and BiDil furosemide and Zebeta as prescribed refills will be issued also potassium supplementation will be refilled  We will bring the patient in for a face-to-face visit in early August along with complete blood draws as transportation is difficult for them to make 2 separate appointments  Patient advised to increase improve hydration while at work and to take a healthy diet with him to work  We also reviewed with the patient healthy diet in general

## 2021-03-04 NOTE — Assessment & Plan Note (Signed)
Patient is likely not at goal with blood pressure he states his [facility]80 for the last blood pressures measured in March of this year he was 160/118  We will bring the patient in for a direct exam and blood pressure reading in short-term

## 2021-03-05 ENCOUNTER — Other Ambulatory Visit: Payer: Self-pay

## 2021-03-11 ENCOUNTER — Other Ambulatory Visit: Payer: Self-pay

## 2021-03-11 ENCOUNTER — Ambulatory Visit: Payer: Medicaid Other | Admitting: Cardiology

## 2021-03-11 NOTE — Progress Notes (Deleted)
Follow up visit  Subjective:   Cody [name], male    DOB: Nov 23, 1970, 50 y.o.   MRN: [id number]   Chief complaint: Cardiomyopathy  50 y.o. African American male  with hypertension, possible OSA, h/o RLE DVT (2017), persistent Afib, CKD3, HFrEF.  ***Patient has been out of his metoprolol and Bidil for last few days. He feels he does not tolerate metoprolol. Exertional dyspnea is stable. Leg edema is well controlled with lasix.    Current Outpatient Medications on File Prior to Visit  Medication Sig Dispense Refill   apixaban (ELIQUIS) 5 MG TABS tablet TAKE 1 TABLET (5 MG TOTAL) BY MOUTH 2 (TWO) TIMES DAILY. 180 tablet 2   bisoprolol (ZEBETA) 5 MG tablet TAKE 1 TABLET (5 MG TOTAL) BY MOUTH DAILY. 90 tablet 2   Colchicine (MITIGARE) 0.6 MG CAPS Take 1 capsule by mouth 2 (two) times daily as needed (gout flare). 60 capsule 1   furosemide (LASIX) 40 MG tablet Take 2 tablets (80 mg total) by mouth daily. Take additional 40 mg as needed for leg edema. 100 tablet 2   isosorbide-hydrALAZINE (BIDIL) 20-37.5 MG tablet TAKE 1 TABLET BY MOUTH 3 (THREE) TIMES DAILY. 270 tablet 3   Potassium Chloride ER (K-TAB) 20 MEQ TBCR Take 1 tablet by mouth 2 (two) times daily. Take with your water pill. 100 tablet 2   sacubitril-valsartan (ENTRESTO) 97-103 MG TAKE 1 TABLET BY MOUTH 2 (TWO) TIMES DAILY. 180 tablet 3   No current facility-administered medications on file prior to visit.    Cardiovascular studies:  EKG 09/26/2020: Atrial fibrillation 86 bpm  Left bundle branch block  Echocardiogram 09/22/2019:  Left ventricle cavity is normal in size. Severe concentric hypertrophy of  the left ventricle. Severe global hypokinesis with severely depressed LV  systolic function with EF 20-25%. Unable to evaluate diastolic function  due to atrial fibrillation.  Left atrial cavity is severely dilated.  Trileaflet aortic valve with no regurgitation. Trace aortic stenosis.  Moderate (Grade II) mitral  regurgitation.  IVC is dilated with respiratory variation. Estimated RA pressure 8 mmHg.  No significant change compared to previous study on 10/29/2018.   EKG 12/07/2018: Atrial fibrillation with controlled ventricular rate Left bundle branch block.   EKG 11/04/2018: Afib w/RVR. IVCD  LE [place] 11/02/2018: Right: There is no evidence of deep vein thrombosis in the lower extremity. No cystic structure found in the popliteal fossa.  Recent labs: 11/14/2019: Glucose 83, BUN/Cr 20/1.65. EGFR 48. Na/K 148/4.2.   12/14/2018: H/H 9.9/32.1. MCV 81. Platelets 303  Review of Systems  Cardiovascular:  Negative for chest pain, dyspnea on exertion, leg swelling, palpitations and syncope.  Genitourinary:        Erectile dysfunction    There were no vitals filed for this visit.     Objective:   Physical Exam Vitals and nursing note reviewed.  Constitutional:      Appearance: He is well-developed.  Neck:     Vascular: No JVD.  Cardiovascular:     Rate and Rhythm: Normal rate. Rhythm irregularly irregular.     Pulses: Intact distal pulses.     Heart sounds: Normal heart sounds. No murmur heard. Pulmonary:     Effort: Pulmonary effort is normal.     Breath sounds: Normal breath sounds. No wheezing or rales.           Assessment & Recommendations:   50 y.o. African American male  with hypertension, possible OSA, h/o RLE DVT (2017), persistent Afib, CKD3, HFrEF, gout  ***  Chronic systolic heart failure (HCC) Etiology unclear. NYHA class I-II symptoms He has opted not to undergo stress test in the past. He does not have any angina symptoms at this time to recommend cardiac catheterization.  Switch metoprolol to bisoprolol 5 mg daily. Contine Entresto 97-103 mg bid, BiDil 20-37.5 mg,  lasix at 80 mg daily, with additional 40 mg as needed for leg edema.   Avoid spironolactone or Farxiga due to renal dysfunction. Recommend low-salt diet.   Again emphasized importance of avoiding  NSAIDS.  Prescribed colchicine for now for gout. Recommend f/u w/PCP.  *** Persistent atrial fibrillation Recommend cardiac telemetry to evaluate his episodes of palpitations CHA2DS2VASc score 3. Annual stroke risk 4% Resume Eiquis 5 mg bid.   *** Essential hypertension Well controlled.   *** Erectile dysfunction: At this point, this is patient's biggest concern and medical issue affecting his quality of life.  Etiology for his erectile dysfunction includes is chronic systolic heart failure, as well as ongoing use of beta-blockers.  Without beta-blockers, he will almost certainly revert back to decompensated heart failure.  Use of PDE 5 inhibitors is limited due to ongoing use of BiDil.  I had a long discussion with the patient regarding risks of concurrent use of BiDil and PDE 5 inhibitor.  Patient requested that he would like to use sildenafil at least once a month.  I have explained to the patient that the only way he could use sildenafil is by not using BiDil for at least 3 days while using sildenafil.  Patient verbalized understanding of cautious use of sildenafil.  While this is not optimal from medical management of his heart failure, this probably remains the only way to treat his erectile dysfunction.   ***F/u in 3 months  [place], MD [facility] Pager: [phone] Office: [phone] If no answer Cell [phone]

## 2021-03-12 ENCOUNTER — Other Ambulatory Visit: Payer: Self-pay

## 2021-03-20 ENCOUNTER — Other Ambulatory Visit: Payer: Self-pay

## 2021-03-22 ENCOUNTER — Other Ambulatory Visit: Payer: Self-pay

## 2021-03-22 ENCOUNTER — Telehealth: Payer: Medicaid Other | Admitting: Cardiology

## 2021-03-22 ENCOUNTER — Encounter: Payer: Self-pay | Admitting: Cardiology

## 2021-03-28 NOTE — Progress Notes (Signed)
Error

## 2021-04-22 ENCOUNTER — Ambulatory Visit: Payer: Medicaid Other | Admitting: Critical Care Medicine

## 2021-04-26 ENCOUNTER — Ambulatory Visit: Payer: Medicaid Other | Admitting: Cardiology

## 2021-05-01 ENCOUNTER — Ambulatory Visit: Payer: Medicaid Other | Admitting: Cardiology

## 2021-05-14 ENCOUNTER — Other Ambulatory Visit: Payer: Self-pay

## 2021-05-20 ENCOUNTER — Ambulatory Visit: Payer: Medicaid Other | Admitting: Cardiology

## 2021-06-10 ENCOUNTER — Other Ambulatory Visit: Payer: Self-pay

## 2021-06-19 ENCOUNTER — Ambulatory Visit: Payer: Medicaid Other | Admitting: Cardiology

## 2021-06-19 NOTE — Progress Notes (Signed)
No show

## 2021-07-01 ENCOUNTER — Other Ambulatory Visit: Payer: Self-pay

## 2021-07-01 ENCOUNTER — Other Ambulatory Visit: Payer: Self-pay | Admitting: Critical Care Medicine

## 2021-07-01 NOTE — Telephone Encounter (Signed)
Requested medications are due for refill today.  yes  Requested medications are on the active medications list.  yes  Last refill. 03/04/2021  Future visit scheduled.   no  Notes to clinic.  Patient was to return for follow up labs per note on 03/04/2021. Also labs are expired.

## 2021-07-03 ENCOUNTER — Other Ambulatory Visit: Payer: Self-pay

## 2021-07-05 ENCOUNTER — Ambulatory Visit: Payer: Medicaid Other | Admitting: Cardiology

## 2021-07-08 ENCOUNTER — Ambulatory Visit: Payer: Medicaid Other | Admitting: Student

## 2021-07-08 NOTE — Progress Notes (Deleted)
 Follow up visit  Subjective:   Cody [name], male    DOB: 06/08/1971, 50 y.o.   MRN: [id number]   Chief complaint: Cardiomyopathy  50 y.o.  African American male  with hypertension, possible OSA, h/o RLE DVT (2017), persistent Afib, CKD3, HFrEF.  Patient has been out of his metoprolol and Bidil for last few days. He feels he does not tolerate metoprolol. Exertional dyspnea is stable. Leg edema is well controlled with lasix.    Current Outpatient Medications on File Prior to Visit  Medication Sig Dispense Refill   apixaban (ELIQUIS) 5 MG TABS tablet TAKE 1 TABLET (5 MG TOTAL) BY MOUTH 2 (TWO) TIMES DAILY. 180 tablet 2   bisoprolol (ZEBETA) 5 MG tablet TAKE 1 TABLET (5 MG TOTAL) BY MOUTH DAILY. 90 tablet 2   Colchicine (MITIGARE) 0.6 MG CAPS Take 1 capsule by mouth 2 (two) times daily as needed (gout flare). 60 capsule 1   furosemide (LASIX) 40 MG tablet Take 2 tablets (80 mg total) by mouth daily. Take additional 40 mg as needed for leg edema. 100 tablet 2   isosorbide-hydrALAZINE (BIDIL) 20-37.5 MG tablet TAKE 1 TABLET BY MOUTH 3 (THREE) TIMES DAILY. 270 tablet 3   Potassium Chloride ER (K-TAB) 20 MEQ TBCR Take 1 tablet by mouth 2 (two) times daily. Take with your water pill. 100 tablet 2   sacubitril-valsartan (ENTRESTO) 97-103 MG TAKE 1 TABLET BY MOUTH 2 (TWO) TIMES DAILY. 180 tablet 3   No current facility-administered medications on file prior to visit.    Cardiovascular studies:  EKG 09/26/2020: Atrial fibrillation 86 bpm  Left bundle branch block  Echocardiogram 09/22/2019:  Left ventricle cavity is normal in size. Severe concentric hypertrophy of  the left ventricle. Severe global hypokinesis with severely depressed LV  systolic function with EF 20-25%. Unable to evaluate diastolic function  due to atrial fibrillation.  Left atrial cavity is severely dilated.  Trileaflet aortic valve with no regurgitation. Trace aortic stenosis.  Moderate (Grade II) mitral regurgitation.   IVC is dilated with respiratory variation. Estimated RA pressure 8 mmHg.  No significant change compared to previous study on 10/29/2018.   EKG 12/07/2018: Atrial fibrillation with controlled ventricular rate Left bundle branch block.   EKG 11/04/2018: Afib w/RVR. IVCD  LE US 11/02/2018: Right: There is no evidence of deep vein thrombosis in the lower extremity. No cystic structure found in the popliteal fossa.  Recent labs: 11/14/2019: Glucose 83, BUN/Cr 20/1.65. EGFR 48. Na/K 148/4.2.   12/14/2018: H/H 9.9/32.1. MCV 81. Platelets 303  Review of Systems  Cardiovascular:  Negative for chest pain, dyspnea on exertion, leg swelling, palpitations and syncope.  Genitourinary:        Erectile dysfunction    There were no vitals filed for this visit.     Objective:   Physical Exam Vitals and nursing note reviewed.  Constitutional:      Appearance: He is well-developed.  Neck:     Vascular: No JVD.  Cardiovascular:     Rate and Rhythm: Normal rate. Rhythm irregularly irregular.     Pulses: Intact distal pulses.     Heart sounds: Normal heart sounds. No murmur heard. Pulmonary:     Effort: Pulmonary effort is normal.     Breath sounds: Normal breath sounds. No wheezing or rales.           Assessment & Recommendations:   50 y.o. African American male  with hypertension, possible OSA, h/o RLE DVT (2017), persistent Afib, CKD3, HFrEF,   gout  Chronic systolic heart failure ([place]) Etiology unclear. NYHA class I-II symptoms He has opted not to undergo stress test in the past. He does not have any angina symptoms at this time to recommend cardiac catheterization.  Switch metoprolol to bisoprolol 5 mg daily. Contine Entresto 97-103 mg bid, BiDil 20-37.5 mg,  lasix at 80 mg daily, with additional 40 mg as needed for leg edema.   Avoid spironolactone or Farxiga due to renal dysfunction. Recommend low-salt diet.   Again emphasized importance of avoiding NSAIDS.  Prescribed  colchicine for now for gout. Recommend f/u w/PCP.  Persistent atrial fibrillation Recommend cardiac telemetry to evaluate his episodes of palpitations CHA2DS2VASc score 3. Annual stroke risk 4% Resume Eiquis 5 mg bid.   Essential hypertension Well controlled.   Erectile dysfunction: At this point, this is patient's biggest concern and medical issue affecting his quality of life.  Etiology for his erectile dysfunction includes is chronic systolic heart failure, as well as ongoing use of beta-blockers.  Without beta-blockers, he will almost certainly revert back to decompensated heart failure.  Use of PDE 5 inhibitors is limited due to ongoing use of BiDil.  I had a long discussion with the patient regarding risks of concurrent use of BiDil and PDE 5 inhibitor.  Patient requested that he would like to use sildenafil at least once a month.  I have explained to the patient that the only way he could use sildenafil is by not using BiDil for at least 3 days while using sildenafil.  Patient verbalized understanding of cautious use of sildenafil.  While this is not optimal from medical management of his heart failure, this probably remains the only way to treat his erectile dysfunction.   F/u in 3 months  Vaughn, MD [facility] Pager: [phone] Office: [phone] If no answer Cell [phone]

## 2021-07-15 NOTE — Progress Notes (Deleted)
 Follow up visit  Subjective:   Cody [name], male    DOB: 08/04/1971, 50 y.o.   MRN: [id number]   Chief complaint: Cardiomyopathy  50 y.o.  African American male  with hypertension, possible OSA, h/o RLE DVT (2017), persistent Afib, CKD3, HFrEF.  Patient has been out of his metoprolol and Bidil for last few days. He feels he does not tolerate metoprolol. Exertional dyspnea is stable. Leg edema is well controlled with lasix.    Current Outpatient Medications on File Prior to Visit  Medication Sig Dispense Refill   apixaban (ELIQUIS) 5 MG TABS tablet TAKE 1 TABLET (5 MG TOTAL) BY MOUTH 2 (TWO) TIMES DAILY. 180 tablet 2   bisoprolol (ZEBETA) 5 MG tablet TAKE 1 TABLET (5 MG TOTAL) BY MOUTH DAILY. 90 tablet 2   Colchicine (MITIGARE) 0.6 MG CAPS Take 1 capsule by mouth 2 (two) times daily as needed (gout flare). 60 capsule 1   furosemide (LASIX) 40 MG tablet Take 2 tablets (80 mg total) by mouth daily. Take additional 40 mg as needed for leg edema. 100 tablet 2   isosorbide-hydrALAZINE (BIDIL) 20-37.5 MG tablet TAKE 1 TABLET BY MOUTH 3 (THREE) TIMES DAILY. 270 tablet 3   Potassium Chloride ER (K-TAB) 20 MEQ TBCR Take 1 tablet by mouth 2 (two) times daily. Take with your water pill. 100 tablet 2   sacubitril-valsartan (ENTRESTO) 97-103 MG TAKE 1 TABLET BY MOUTH 2 (TWO) TIMES DAILY. 180 tablet 3   No current facility-administered medications on file prior to visit.    Cardiovascular studies:  EKG 09/26/2020: Atrial fibrillation 86 bpm  Left bundle branch block  Echocardiogram 09/22/2019:  Left ventricle cavity is normal in size. Severe concentric hypertrophy of  the left ventricle. Severe global hypokinesis with severely depressed LV  systolic function with EF 20-25%. Unable to evaluate diastolic function  due to atrial fibrillation.  Left atrial cavity is severely dilated.  Trileaflet aortic valve with no regurgitation. Trace aortic stenosis.  Moderate (Grade II) mitral regurgitation.   IVC is dilated with respiratory variation. Estimated RA pressure 8 mmHg.  No significant change compared to previous study on 10/29/2018.   EKG 12/07/2018: Atrial fibrillation with controlled ventricular rate Left bundle branch block.   EKG 11/04/2018: Afib w/RVR. IVCD  LE US 11/02/2018: Right: There is no evidence of deep vein thrombosis in the lower extremity. No cystic structure found in the popliteal fossa.  Recent labs: 11/14/2019: Glucose 83, BUN/Cr 20/1.65. EGFR 48. Na/K 148/4.2.   12/14/2018: H/H 9.9/32.1. MCV 81. Platelets 303  Review of Systems  Cardiovascular:  Negative for chest pain, dyspnea on exertion, leg swelling, palpitations and syncope.  Genitourinary:        Erectile dysfunction    There were no vitals filed for this visit.     Objective:   Physical Exam Vitals and nursing note reviewed.  Constitutional:      Appearance: He is well-developed.  Neck:     Vascular: No JVD.  Cardiovascular:     Rate and Rhythm: Normal rate. Rhythm irregularly irregular.     Pulses: Intact distal pulses.     Heart sounds: Normal heart sounds. No murmur heard. Pulmonary:     Effort: Pulmonary effort is normal.     Breath sounds: Normal breath sounds. No wheezing or rales.           Assessment & Recommendations:   50 y.o. African American male  with hypertension, possible OSA, h/o RLE DVT (2017), persistent Afib, CKD3, HFrEF,   gout  Chronic systolic heart failure ([facility]) Etiology unclear. NYHA class I-II symptoms He has opted not to undergo stress test in the past. He does not have any angina symptoms at this time to recommend cardiac catheterization.  Switch metoprolol to bisoprolol 5 mg daily. Contine Entresto 97-103 mg bid, BiDil 20-37.5 mg,  lasix at 80 mg daily, with additional 40 mg as needed for leg edema.   Avoid spironolactone or Farxiga due to renal dysfunction. Recommend low-salt diet.   Again emphasized importance of avoiding NSAIDS.  Prescribed  colchicine for now for gout. Recommend f/u w/PCP.  Persistent atrial fibrillation Recommend cardiac telemetry to evaluate his episodes of palpitations CHA2DS2VASc score 3. Annual stroke risk 4% Resume Eiquis 5 mg bid.   Essential hypertension Well controlled.   Erectile dysfunction: At this point, this is patient's biggest concern and medical issue affecting his quality of life.  Etiology for his erectile dysfunction includes is chronic systolic heart failure, as well as ongoing use of beta-blockers.  Without beta-blockers, he will almost certainly revert back to decompensated heart failure.  Use of PDE 5 inhibitors is limited due to ongoing use of BiDil.  I had a long discussion with the patient regarding risks of concurrent use of BiDil and PDE 5 inhibitor.  Patient requested that he would like to use sildenafil at least once a month.  I have explained to the patient that the only way he could use sildenafil is by not using BiDil for at least 3 days while using sildenafil.  Patient verbalized understanding of cautious use of sildenafil.  While this is not optimal from medical management of his heart failure, this probably remains the only way to treat his erectile dysfunction.   F/u in 3 months  Manish J Patwardhan, MD [facility] Pager: [phone] Office: [phone] If no answer Cell [phone]    

## 2021-07-16 ENCOUNTER — Ambulatory Visit: Payer: Medicaid Other | Admitting: Student

## 2021-07-26 ENCOUNTER — Other Ambulatory Visit: Payer: Self-pay

## 2021-07-26 ENCOUNTER — Encounter: Payer: Self-pay | Admitting: Student

## 2021-07-26 ENCOUNTER — Ambulatory Visit: Payer: Medicaid Other | Admitting: Student

## 2021-07-26 ENCOUNTER — Other Ambulatory Visit: Payer: Self-pay | Admitting: Critical Care Medicine

## 2021-07-26 DIAGNOSIS — I4819 Other persistent atrial fibrillation: Secondary | ICD-10-CM

## 2021-07-26 DIAGNOSIS — I10 Essential (primary) hypertension: Secondary | ICD-10-CM

## 2021-07-26 DIAGNOSIS — I5022 Chronic systolic (congestive) heart failure: Secondary | ICD-10-CM

## 2021-07-26 MED ORDER — ENTRESTO 97-103 MG PO TABS
1.0000 | ORAL_TABLET | Freq: Two times a day (BID) | ORAL | 3 refills | Status: AC
  Filled 2021-07-26: qty 60, 30d supply, fill #0
  Filled 2021-07-30: qty 180, 90d supply, fill #0

## 2021-07-26 MED ORDER — ISOSORB DINITRATE-HYDRALAZINE 20-37.5 MG PO TABS
1.0000 | ORAL_TABLET | Freq: Three times a day (TID) | ORAL | 3 refills | Status: AC
  Filled 2021-07-26 – 2021-08-05 (×2): qty 270, 90d supply, fill #0

## 2021-07-26 MED ORDER — POTASSIUM CHLORIDE ER 20 MEQ PO TBCR
2.0000 | EXTENDED_RELEASE_TABLET | Freq: Every day | ORAL | 3 refills | Status: AC
  Filled 2021-07-26: qty 180, 90d supply, fill #0

## 2021-07-26 MED ORDER — APIXABAN 5 MG PO TABS
5.0000 mg | ORAL_TABLET | Freq: Two times a day (BID) | ORAL | 3 refills | Status: AC
  Filled 2021-07-26: qty 180, 90d supply, fill #0

## 2021-07-26 MED ORDER — FUROSEMIDE 40 MG PO TABS
80.0000 mg | ORAL_TABLET | Freq: Every day | ORAL | 3 refills | Status: AC
  Filled 2021-07-26 – 2021-08-05 (×2): qty 180, 90d supply, fill #0

## 2021-07-26 MED ORDER — BISOPROLOL FUMARATE 5 MG PO TABS
5.0000 mg | ORAL_TABLET | Freq: Every day | ORAL | 3 refills | Status: AC
  Filled 2021-07-26 – 2021-10-03 (×3): qty 90, 90d supply, fill #0

## 2021-07-26 NOTE — Patient Instructions (Signed)
Labs at [facility] in 1 week.  

## 2021-07-26 NOTE — Progress Notes (Signed)
Follow up visit  Subjective:   Cody [name], male    DOB: 11-25-1970, 50 y.o.   MRN: [id number]   Chief complaint: Cardiomyopathy  50 y.o.  African American male  with hypertension, possible OSA, h/o RLE DVT (2017), persistent Afib, CKD3, HFrEF.  Patient notably has history of dietary and medication noncompliance.  He has also missed several appointments with our office the past.  Patient has been without Entresto for at least 1 week.  He continues to take Lasix 80 mg p.o. twice daily.  He has mild bilateral ankle edema which she states is stable.  He is tolerating anticoagulation without bleeding diathesis.  Denies chest pain, palpitations, syncope, near syncope, orthopnea, PND.  Current Outpatient Medications on File Prior to Visit  Medication Sig Dispense Refill   Colchicine (MITIGARE) 0.6 MG CAPS Take 1 capsule by mouth 2 (two) times daily as needed (gout flare). 60 capsule 1   No current facility-administered medications on file prior to visit.    Cardiovascular studies: EKG 07/26/2021: Atrial fibrillation with controlled ventricular response at a rate of 80 bpm.  Left bundle branch block, no further analysis.  Compared EKG 09/26/2020, no significant change.  EKG 09/26/2020: Atrial fibrillation 86 bpm  Left bundle branch block  Echocardiogram 09/22/2019:  Left ventricle cavity is normal in size. Severe concentric hypertrophy of  the left ventricle. Severe global hypokinesis with severely depressed LV  systolic function with EF 20-25%. Unable to evaluate diastolic function  due to atrial fibrillation.  Left atrial cavity is severely dilated.  Trileaflet aortic valve with no regurgitation. Trace aortic stenosis.  Moderate (Grade II) mitral regurgitation.  IVC is dilated with respiratory variation. Estimated RA pressure 8 mmHg.  No significant change compared to previous study on 10/29/2018.   EKG 12/07/2018: Atrial fibrillation with controlled ventricular rate Left bundle  branch block.   EKG 11/04/2018: Afib w/RVR. IVCD  LE [place] 11/02/2018: Right: There is no evidence of deep vein thrombosis in the lower extremity. No cystic structure found in the popliteal fossa.  Recent labs: CMP Latest Ref Rng & Units 11/14/2019 12/14/2018 11/04/2018  Glucose 65 - 99 mg/dL 83 96 109(H)  BUN 6 - 24 mg/dL 20 14 25(H)  Creatinine 0.76 - 1.27 mg/dL 1.65(H) 1.54(H) 1.37(H)  Sodium 134 - 144 mmol/L 144 140 140  Potassium 3.5 - 5.2 mmol/L 4.2 3.4(L) 3.5  Chloride 96 - 106 mmol/L 103 100 104  CO2 20 - 29 mmol/L _0 Calcium 8.7 - 10.2 mg/dL 9.7 9.5 9.0  Total Protein 6.5 - 8.1 g/dL - - 6.7  Total Bilirubin 0.3 - 1.2 mg/dL - - 1.1  Alkaline Phos 38 - 126 U/L - - 85  AST 15 - 41 U/L - - 21  ALT 0 - 44 U/L - - 20   CBC Latest Ref Rng & Units 12/14/2018 11/04/2018 10/29/2018  WBC 4.0 - 10.5 K/uL 7.8 10.7(H) 9.3  Hemoglobin 13.0 - 17.0 g/dL 9.9(L) 11.4(L) 11.3(L)  Hematocrit 39.0 - 52.0 % 32.1(L) 38.5(L) 37.6(L)  Platelets 150 - 400 K/uL 303 386 336   Lipid Panel  No results found for: CHOL, TRIG, HDL, CHOLHDL, VLDL, LDLCALC, LDLDIRECT HEMOGLOBIN A1C Lab Results  Component Value Date   HGBA1C 6.0 (H) 01/27/2017   MPG 126 01/27/2017   TSH No results for input(s): TSH in the last 8760 hours.  External Labs:  11/14/2019: Glucose 83, BUN/Cr 20/1.65. EGFR 48. Na/K 148/4.2.   12/14/2018: H/H 9.9/32.1. MCV 81. Platelets 303  Review of Systems  Constitutional: Negative for malaise/fatigue and weight gain.  Cardiovascular:  Positive for leg swelling (stable). Negative for chest pain, claudication, dyspnea on exertion, near-syncope, orthopnea, palpitations, paroxysmal nocturnal dyspnea and syncope.  Respiratory:  Negative for shortness of breath.   Neurological:  Negative for dizziness.    Vitals:   07/26/21 1025  BP: (!) 143/96  Pulse: 95  Resp: 14  Temp: 98.2 F (36.8 C)  SpO2: 99%       Objective:   Physical Exam Vitals reviewed.  Constitutional:       Appearance: He is well-developed. He is obese.  Neck:     Vascular: No JVD.  Cardiovascular:     Rate and Rhythm: Normal rate. Rhythm irregularly irregular.     Pulses: Intact distal pulses.     Heart sounds: Normal heart sounds. No murmur heard. Pulmonary:     Effort: Pulmonary effort is normal.     Breath sounds: Normal breath sounds. No wheezing or rales.  Musculoskeletal:     Right lower leg: Edema (minimal) present.     Left lower leg: Edema (minimal) present.       Assessment & Recommendations:   50 y.o. African American male  with hypertension, possible OSA, h/o RLE DVT (2017), persistent Afib, CKD3, HFrEF, gout  Chronic systolic heart failure ([place]) Etiology unclear. NYHA class I-II symptoms He has opted not to undergo stress test in the past. He does not have any angina symptoms at this time to recommend cardiac catheterization.  I have refilled and recommend he continue bisoprolol, BiDil, Entresto, as well as Lasix and Eliquis, also potassium supplement. Patient has been without Entresto for the last 1 week, will refill this and repeat BMP in 1 week.   At this time we will continue to avoid spironolactone or Farxiga due to renal dysfunction. Recommend low-salt diet.   Again emphasized importance of avoiding NSAIDS.  Refilled short dose of colchicine, however will defer further management to PCP. Discussed at length with patient the importance of medication compliance, dietary compliance, and weight loss. Will repeat echocardiogram at this time as it has patient has not been on guideline directed medical therapy.  Persistent atrial fibrillation Patient has had no recurrence of palpitations and EKG today reveals atrial fibrillation with controlled ventricular response.   CHA2DS2VASc score 3. Annual stroke risk 4% Resume Eiquis 5 mg bid.  Continue rate control  Essential hypertension Uncontrolled Patient's blood pressure is likely elevated because he has been without  Entresto for the last week.  We will resume Entresto and repeat BMP in 1 week.  Erectile dysfunction: The past patient has reported erectile dysfunction.  Counseled patient regarding interaction of PDE E5 inhibitors and BiDil, patient is aware of the risks and has opted to continue BiDil and avoid use of PDE 5 inhibitors.   Follow up in 3 months, sooner if needed, for HFrEF and HTN.    Alethia Berthold, PA-C 07/26/2021, 2:08 PM Office: [phone]

## 2021-07-29 ENCOUNTER — Other Ambulatory Visit: Payer: Self-pay

## 2021-07-30 ENCOUNTER — Other Ambulatory Visit: Payer: Self-pay

## 2021-07-31 ENCOUNTER — Telehealth: Payer: Self-pay

## 2021-07-31 ENCOUNTER — Other Ambulatory Visit: Payer: Self-pay

## 2021-07-31 NOTE — Telephone Encounter (Signed)
PA for Entresto approved until 07/31/22

## 2021-08-01 ENCOUNTER — Other Ambulatory Visit: Payer: Self-pay

## 2021-08-02 ENCOUNTER — Other Ambulatory Visit: Payer: Self-pay

## 2021-08-05 ENCOUNTER — Other Ambulatory Visit: Payer: Self-pay

## 2021-08-06 ENCOUNTER — Other Ambulatory Visit: Payer: Self-pay

## 2021-08-27 ENCOUNTER — Other Ambulatory Visit: Payer: Medicaid Other

## 2021-08-28 ENCOUNTER — Encounter: Payer: Self-pay | Admitting: Cardiology

## 2021-10-03 ENCOUNTER — Other Ambulatory Visit: Payer: Self-pay

## 2021-10-10 ENCOUNTER — Other Ambulatory Visit: Payer: Self-pay

## 2021-10-24 ENCOUNTER — Ambulatory Visit: Payer: Medicaid Other | Admitting: Student

## 2022-08-21 ENCOUNTER — Telehealth: Payer: Self-pay | Admitting: Critical Care Medicine

## 2022-08-21 NOTE — Telephone Encounter (Signed)
Samples are not available at our clinic. Unfortunately, this patient has not been seen since 02/2021. He will need an appt before we can provide refills. It looks like he is established at [facility] now. He should contact that office to see what they offer.

## 2022-08-21 NOTE — Telephone Encounter (Signed)
Copied from [place] [id number]. Topic: General - Other >> Aug 21, 2022  3:52 PM Sabas Sous wrote: Reason for CRM: Pt called reporting that his pharmacy is out of his medication. He wants to know if he can get samples from the office of both sacubitril-valsartan (ENTRESTO) 97-103 MG  And another medication he is calling "Bydo"

## 2023-04-01 NOTE — Telephone Encounter (Signed)
done
